# Patient Record
Sex: Female | Born: 1960 | Hispanic: Yes | Smoking: Former smoker
Health system: Northeastern US, Community
[De-identification: ages and names within clinical notes are randomized; demographics above are authoritative.]

## PROBLEM LIST (undated history)

## (undated) DIAGNOSIS — H524 Presbyopia: Secondary | ICD-10-CM

## (undated) DIAGNOSIS — A599 Trichomoniasis, unspecified: Secondary | ICD-10-CM

## (undated) DIAGNOSIS — A539 Syphilis, unspecified: Secondary | ICD-10-CM

## (undated) DIAGNOSIS — F329 Major depressive disorder, single episode, unspecified: Secondary | ICD-10-CM

## (undated) DIAGNOSIS — R946 Abnormal results of thyroid function studies: Secondary | ICD-10-CM

## (undated) DIAGNOSIS — N390 Urinary tract infection, site not specified: Secondary | ICD-10-CM

## (undated) DIAGNOSIS — H43819 Vitreous degeneration, unspecified eye: Principal | ICD-10-CM

## (undated) HISTORY — DX: Urinary tract infection, site not specified: N39.0

## (undated) HISTORY — PX: TRURL RF FEMALE BLADDER NECK STRS URIN INCONT: PRO0659

## (undated) HISTORY — DX: Abnormal results of thyroid function studies: R94.6

## (undated) HISTORY — PX: BREAST BIOPSY & NEEDLE LOC WIR: 2500020

## (undated) HISTORY — DX: Presbyopia: H52.4

## (undated) HISTORY — PX: OB ANTEPARTUM CARE CESAREAN DLVR & POSTPARTUM: REP299

## (undated) HISTORY — DX: Major depressive disorder, single episode, unspecified: F32.9

## (undated) HISTORY — DX: Vitreous degeneration, unspecified eye: H43.819

## (undated) HISTORY — DX: Syphilis, unspecified: A53.9

## (undated) HISTORY — DX: Trichomoniasis, unspecified: A59.9

## (undated) HISTORY — PX: BREAST BIOPSY: SHX20

## (undated) HISTORY — PX: EXCISION EXCESSIVE SKIN & SUBQ TISSUE ABDOMEN: PRO029

---

## 1996-07-13 ENCOUNTER — Ambulatory Visit: Payer: Self-pay

## 1996-07-13 DIAGNOSIS — N390 Urinary tract infection, site not specified: Secondary | ICD-10-CM

## 1996-07-26 ENCOUNTER — Ambulatory Visit: Payer: Self-pay

## 1996-07-26 DIAGNOSIS — B3731 Acute candidiasis of vulva and vagina: Secondary | ICD-10-CM

## 1996-07-26 DIAGNOSIS — B373 Candidiasis of vulva and vagina: Secondary | ICD-10-CM

## 1996-07-26 DIAGNOSIS — A5901 Trichomonal vulvovaginitis: Secondary | ICD-10-CM

## 1996-10-04 HISTORY — PX: LIG/TRNSXJ FLP TUBE ABDL/VAG APPR UNI/BI: REP202

## 1997-01-01 ENCOUNTER — Ambulatory Visit: Payer: Self-pay | Admitting: Nurse Practitioner

## 1997-01-01 DIAGNOSIS — Z3400 Encounter for supervision of normal first pregnancy, unspecified trimester: Secondary | ICD-10-CM

## 1997-01-15 ENCOUNTER — Ambulatory Visit: Payer: Self-pay | Admitting: Ambulatory Care

## 1997-01-15 DIAGNOSIS — R369 Urethral discharge, unspecified: Secondary | ICD-10-CM

## 1997-02-01 LAB — SKIN TEST TUBERCULOSIS INTRADERMAL: PPD: POSITIVE

## 1997-05-23 ENCOUNTER — Emergency Department (HOSPITAL_BASED_OUTPATIENT_CLINIC_OR_DEPARTMENT_OTHER): Payer: Self-pay | Admitting: Emergency Medicine

## 1997-05-23 DIAGNOSIS — O4290 Premature rupture of membranes, unspecified as to length of time between rupture and onset of labor, unspecified weeks of gestation: Secondary | ICD-10-CM

## 1999-01-24 ENCOUNTER — Emergency Department (HOSPITAL_BASED_OUTPATIENT_CLINIC_OR_DEPARTMENT_OTHER): Payer: Self-pay | Admitting: Emergency Medicine

## 1999-01-24 DIAGNOSIS — R55 Syncope and collapse: Secondary | ICD-10-CM

## 1999-01-24 DIAGNOSIS — N390 Urinary tract infection, site not specified: Secondary | ICD-10-CM

## 1999-01-29 ENCOUNTER — Ambulatory Visit: Payer: Self-pay | Admitting: Obstetrics & Gynecology

## 1999-02-05 ENCOUNTER — Ambulatory Visit: Payer: Self-pay | Admitting: Obstetrics & Gynecology

## 1999-02-16 ENCOUNTER — Ambulatory Visit (HOSPITAL_BASED_OUTPATIENT_CLINIC_OR_DEPARTMENT_OTHER): Payer: Self-pay | Admitting: Neurology

## 1999-02-26 ENCOUNTER — Other Ambulatory Visit (HOSPITAL_BASED_OUTPATIENT_CLINIC_OR_DEPARTMENT_OTHER): Payer: Self-pay | Admitting: Neurology

## 1999-03-26 ENCOUNTER — Ambulatory Visit (HOSPITAL_BASED_OUTPATIENT_CLINIC_OR_DEPARTMENT_OTHER): Payer: Self-pay | Admitting: Neurology

## 1999-04-13 ENCOUNTER — Ambulatory Visit: Payer: Self-pay | Admitting: Obstetrics & Gynecology

## 1999-04-20 ENCOUNTER — Ambulatory Visit: Payer: Self-pay | Admitting: Obstetrics & Gynecology

## 1999-05-04 ENCOUNTER — Ambulatory Visit: Payer: Self-pay | Admitting: Obstetrics & Gynecology

## 1999-05-04 ENCOUNTER — Ambulatory Visit (HOSPITAL_BASED_OUTPATIENT_CLINIC_OR_DEPARTMENT_OTHER): Payer: Self-pay | Admitting: Neurology

## 1999-05-07 ENCOUNTER — Other Ambulatory Visit: Payer: Self-pay | Admitting: Neurology

## 1999-05-21 ENCOUNTER — Ambulatory Visit (HOSPITAL_BASED_OUTPATIENT_CLINIC_OR_DEPARTMENT_OTHER): Payer: Self-pay | Admitting: Neurology

## 1999-05-21 ENCOUNTER — Ambulatory Visit: Payer: Self-pay | Admitting: Obstetrics & Gynecology

## 1999-06-02 ENCOUNTER — Ambulatory Visit (HOSPITAL_BASED_OUTPATIENT_CLINIC_OR_DEPARTMENT_OTHER): Payer: Self-pay | Admitting: Neurology

## 1999-06-05 ENCOUNTER — Other Ambulatory Visit: Payer: Self-pay | Admitting: Neurology

## 1999-06-23 ENCOUNTER — Ambulatory Visit (HOSPITAL_BASED_OUTPATIENT_CLINIC_OR_DEPARTMENT_OTHER): Payer: Self-pay | Admitting: Neurology

## 1999-06-23 LAB — CYTOLOGY SPECIMEN

## 1999-06-29 ENCOUNTER — Ambulatory Visit: Payer: Self-pay | Admitting: Obstetrics & Gynecology

## 1999-06-30 ENCOUNTER — Ambulatory Visit: Payer: Self-pay | Admitting: Internal Medicine

## 1999-06-30 LAB — CHG CYTP SLIDES CERV/VAG MNL SCRN PHYSICIAN SUPV

## 1999-07-07 ENCOUNTER — Ambulatory Visit (HOSPITAL_BASED_OUTPATIENT_CLINIC_OR_DEPARTMENT_OTHER): Payer: Self-pay | Admitting: Neurology

## 1999-07-14 ENCOUNTER — Ambulatory Visit: Payer: Self-pay | Admitting: Internal Medicine

## 1999-10-02 ENCOUNTER — Ambulatory Visit (HOSPITAL_BASED_OUTPATIENT_CLINIC_OR_DEPARTMENT_OTHER): Payer: Self-pay

## 1999-10-26 ENCOUNTER — Ambulatory Visit (HOSPITAL_BASED_OUTPATIENT_CLINIC_OR_DEPARTMENT_OTHER): Payer: Self-pay

## 1999-11-16 ENCOUNTER — Ambulatory Visit: Payer: Self-pay | Admitting: Obstetrics & Gynecology

## 1999-11-16 LAB — URINE CULTURE/COLONY COUNT: URINE CULTURE/COLONY COUNT: NO GROWTH

## 1999-11-16 LAB — RPR: RPR: REACTIVE — ABNORMAL HIGH

## 1999-11-16 LAB — HERPES CULTURE: HERPES CULTURE: NEGATIVE

## 1999-12-02 ENCOUNTER — Ambulatory Visit: Payer: Self-pay | Admitting: Obstetrics & Gynecology

## 1999-12-02 LAB — IADNA NEISSERIA GONORRHOEAE DIRECT PROBE TQ: GENPROBE GC: NEGATIVE

## 1999-12-02 LAB — URINALYSIS
BILIRUBIN, URINE: NEGATIVE
GLUCOSE, URINE: NEGATIVE MG/DL
KETONE, URINE: NEGATIVE MG/DL
LEUKOCYTE ESTERASE: NEGATIVE
NITRITE, URINE: NEGATIVE
OCCULT BLOOD, URINE: NEGATIVE
PH URINE: 5 (ref 5.0–8.0)
PROTEIN, URINE: NEGATIVE MG/DL
SPECIFIC GRAVITY URINE: 1.02 (ref 1.003–1.030)

## 1999-12-02 LAB — IADNA CHLAMYDIA TRACHOMATIS DIRECT PROBE TQ: GENPROBE CHLAMYDIA: NEGATIVE

## 1999-12-02 LAB — URINE CULTURE/COLONY COUNT: URINE CULTURE/COLONY COUNT: NO GROWTH

## 1999-12-03 ENCOUNTER — Ambulatory Visit (HOSPITAL_BASED_OUTPATIENT_CLINIC_OR_DEPARTMENT_OTHER): Payer: Self-pay

## 1999-12-16 ENCOUNTER — Ambulatory Visit: Payer: Self-pay | Admitting: Obstetrics & Gynecology

## 1999-12-24 ENCOUNTER — Ambulatory Visit (HOSPITAL_BASED_OUTPATIENT_CLINIC_OR_DEPARTMENT_OTHER): Payer: Self-pay

## 2000-01-14 ENCOUNTER — Ambulatory Visit (HOSPITAL_BASED_OUTPATIENT_CLINIC_OR_DEPARTMENT_OTHER): Payer: Self-pay

## 2000-02-23 ENCOUNTER — Ambulatory Visit: Payer: Self-pay | Admitting: Obstetrics & Gynecology

## 2000-02-23 LAB — IADNA CHLAMYDIA TRACHOMATIS DIRECT PROBE TQ: GENPROBE CHLAMYDIA: NEGATIVE

## 2000-02-23 LAB — IADNA NEISSERIA GONORRHOEAE DIRECT PROBE TQ: GENPROBE GC: NEGATIVE

## 2000-06-04 ENCOUNTER — Emergency Department (HOSPITAL_BASED_OUTPATIENT_CLINIC_OR_DEPARTMENT_OTHER): Payer: Self-pay | Admitting: Emergency Medical Services

## 2000-06-04 DIAGNOSIS — J329 Chronic sinusitis, unspecified: Secondary | ICD-10-CM

## 2000-07-18 ENCOUNTER — Ambulatory Visit: Payer: Self-pay | Admitting: OB/Gyn

## 2000-07-18 ENCOUNTER — Ambulatory Visit: Payer: Self-pay | Admitting: Internal Medicine

## 2000-07-18 LAB — URINALYSIS
BILIRUBIN, URINE: NEGATIVE
GLUCOSE, URINE: NEGATIVE MG/DL
KETONE, URINE: NEGATIVE MG/DL
LEUKOCYTE ESTERASE: NEGATIVE
NITRITE, URINE: NEGATIVE
OCCULT BLOOD, URINE: NEGATIVE
PH URINE: 5.5 (ref 5.0–8.0)
PROTEIN, URINE: NEGATIVE MG/DL
SPECIFIC GRAVITY URINE: 1.02 (ref 1.003–1.035)

## 2000-07-18 LAB — IADNA NEISSERIA GONORRHOEAE DIRECT PROBE TQ: GENPROBE GC: NEGATIVE

## 2000-07-18 LAB — IADNA CHLAMYDIA TRACHOMATIS DIRECT PROBE TQ: GENPROBE CHLAMYDIA: NEGATIVE

## 2000-07-18 LAB — URINE CULTURE/COLONY COUNT

## 2000-10-02 ENCOUNTER — Emergency Department (HOSPITAL_BASED_OUTPATIENT_CLINIC_OR_DEPARTMENT_OTHER): Payer: Self-pay | Admitting: Emergency Medicine

## 2000-10-02 DIAGNOSIS — R42 Dizziness and giddiness: Secondary | ICD-10-CM

## 2000-10-02 LAB — BLOOD COUNT COMPLETE AUTO&AUTO DIFRNTL WBC
BASOPHIL %: 0.6 % (ref 0–2)
EOSINOPHIL %: 0.5 % (ref 0–7)
HEMATOCRIT: 39.9 % (ref 37.0–47.0)
HEMOGLOBIN: 13.2 g/dL (ref 12.0–16.0)
LYMPHOCYTE %: 13.7 % (ref 12–39)
MEAN CORP HGB CONC: 33.1 g/dL (ref 32.0–36.0)
MEAN CORPUSCULAR HGB: 31.3 pg — ABNORMAL HIGH (ref 27.0–31.0)
MEAN CORPUSCULAR VOL: 94.5 fL (ref 81.0–99.0)
MEAN PLATELET VOLUME: 7.8 fL — ABNORMAL LOW (ref 8.0–12.0)
MONOCYTE %: 5.3 % (ref 1–12)
NEUTROPHIL %: 79.9 % — ABNORMAL HIGH (ref 46–79)
PLATELET COUNT: 250 10*3/uL (ref 150–400)
RBC DISTRIBUTION WIDTH: 11.6 % (ref 11.5–14.3)
RED BLOOD CELL COUNT: 4.23 MIL/uL (ref 4.20–5.40)
WHITE BLOOD CELL COUNT: 8.8 10*3/uL (ref 4.8–10.8)

## 2000-10-02 LAB — COMPREHENSIVE METABOLIC PANEL
ALANINE AMINOTRANSFERASE: 10 IU/L (ref 3–36)
ALBUMIN: 4.3 g/dl (ref 3.5–5.0)
ALKALINE PHOSPHATASE: 69 IU/L (ref 50–136)
ASPARTATE AMINOTRANSFERASE: 27 U/L (ref 7–29)
BILIRUBIN TOTAL: 0.8 mg/dl (ref 0.15–1.0)
BUN (UREA NITROGEN): 9 mg/dl — ABNORMAL LOW (ref 10–20)
CALCIUM: 9.3 mg/dl (ref 8.5–10.5)
CARBON DIOXIDE: 26 mEQ/L (ref 22–32)
CHLORIDE: 110 mEQ/L (ref 98–110)
CREATININE: 0.6 mg/dl — ABNORMAL LOW (ref 0.8–1.2)
Glucose Random: 96 mg/dl (ref 65–160)
POTASSIUM: 5.1 mEQ/L — ABNORMAL HIGH (ref 3.5–5.0)
SODIUM: 142 mEQ/L (ref 135–145)
TOTAL PROTEIN: 7.6 g/dl (ref 6.2–8.5)

## 2000-11-02 ENCOUNTER — Ambulatory Visit: Payer: Self-pay | Admitting: Family Medicine

## 2000-11-02 LAB — IADNA NEISSERIA GONORRHOEAE DIRECT PROBE TQ: GENPROBE GC: NEGATIVE

## 2000-11-02 LAB — CYTOPATH, C/V, THIN LAYER

## 2000-11-02 LAB — IADNA CHLAMYDIA TRACHOMATIS DIRECT PROBE TQ: GENPROBE CHLAMYDIA: NEGATIVE

## 2000-11-11 ENCOUNTER — Other Ambulatory Visit: Payer: Self-pay | Admitting: Family Medicine

## 2000-11-11 LAB — FAT AND FIBER, STOOL

## 2000-11-11 LAB — GIARDIA ANTIGEN: GIARDIA ANTIGEN: NEGATIVE

## 2000-11-11 LAB — GASTROINTESTINAL CULTURE

## 2000-11-11 LAB — FECAL LEUKOCYTE SCREEN: PROCEDURE PROMPT: NONE SEEN

## 2001-01-04 ENCOUNTER — Ambulatory Visit: Payer: Self-pay | Admitting: Family Medicine

## 2001-01-15 ENCOUNTER — Emergency Department (HOSPITAL_BASED_OUTPATIENT_CLINIC_OR_DEPARTMENT_OTHER): Payer: Self-pay | Admitting: Emergency Medicine

## 2001-01-15 DIAGNOSIS — N739 Female pelvic inflammatory disease, unspecified: Secondary | ICD-10-CM

## 2001-01-15 LAB — IADNA CHLAMYDIA TRACHOMATIS DIRECT PROBE TQ: GENPROBE CHLAMYDIA: NEGATIVE

## 2001-01-15 LAB — URINE CULTURE/COLONY COUNT

## 2001-01-15 LAB — IADNA NEISSERIA GONORRHOEAE DIRECT PROBE TQ: GENPROBE GC: NEGATIVE

## 2001-03-10 ENCOUNTER — Ambulatory Visit: Payer: Self-pay | Admitting: Obstetrics & Gynecology

## 2001-03-10 LAB — URINE CULTURE/COLONY COUNT

## 2001-05-01 ENCOUNTER — Ambulatory Visit: Payer: Self-pay | Admitting: Internal Medicine

## 2001-06-26 ENCOUNTER — Ambulatory Visit: Payer: Self-pay | Admitting: Internal Medicine

## 2001-08-03 ENCOUNTER — Other Ambulatory Visit: Payer: Self-pay

## 2001-08-03 ENCOUNTER — Ambulatory Visit: Payer: Self-pay | Admitting: OB/Gyn

## 2001-08-03 DIAGNOSIS — Z717 Human immunodeficiency virus [HIV] counseling: Secondary | ICD-10-CM

## 2001-08-03 LAB — IADNA CHLAMYDIA TRACHOMATIS DIRECT PROBE TQ: GENPROBE CHLAMYDIA: NEGATIVE

## 2001-08-03 LAB — IADNA NEISSERIA GONORRHOEAE DIRECT PROBE TQ: GENPROBE GC: NEGATIVE

## 2001-08-03 LAB — URINE CULTURE/COLONY COUNT: URINE CULTURE/COLONY COUNT: NO GROWTH

## 2001-08-24 ENCOUNTER — Ambulatory Visit: Payer: Self-pay | Admitting: OB/Gyn

## 2001-08-24 LAB — IADNA CHLAMYDIA TRACHOMATIS DIRECT PROBE TQ: GENPROBE CHLAMYDIA: NEGATIVE

## 2001-08-24 LAB — IADNA NEISSERIA GONORRHOEAE DIRECT PROBE TQ: GENPROBE GC: NEGATIVE

## 2001-08-24 LAB — CYTOPATH, C/V, THIN LAYER

## 2001-09-12 ENCOUNTER — Ambulatory Visit: Payer: Self-pay | Admitting: Internal Medicine

## 2001-09-14 ENCOUNTER — Emergency Department (HOSPITAL_BASED_OUTPATIENT_CLINIC_OR_DEPARTMENT_OTHER): Payer: Self-pay | Admitting: Emergency Medicine

## 2001-09-14 DIAGNOSIS — R42 Dizziness and giddiness: Secondary | ICD-10-CM

## 2001-09-16 ENCOUNTER — Emergency Department (HOSPITAL_BASED_OUTPATIENT_CLINIC_OR_DEPARTMENT_OTHER): Payer: Self-pay | Admitting: Emergency Medicine

## 2001-09-16 DIAGNOSIS — R42 Dizziness and giddiness: Secondary | ICD-10-CM

## 2001-09-20 LAB — CT HEAD WO CONTRAST

## 2001-10-09 ENCOUNTER — Ambulatory Visit: Payer: Self-pay | Admitting: Internal Medicine

## 2002-01-04 ENCOUNTER — Other Ambulatory Visit: Payer: Self-pay | Admitting: Internal Medicine

## 2002-01-04 LAB — MA SCREENING MAMMO BILATERAL WITH CAD

## 2002-05-10 ENCOUNTER — Ambulatory Visit: Payer: Self-pay | Admitting: Nurse Practitioner

## 2002-05-31 ENCOUNTER — Encounter (HOSPITAL_BASED_OUTPATIENT_CLINIC_OR_DEPARTMENT_OTHER): Payer: Self-pay

## 2002-07-09 ENCOUNTER — Other Ambulatory Visit: Payer: Self-pay | Admitting: Nurse Practitioner

## 2002-07-11 LAB — MA DIAGNOSTIC MAMMO UNILATERAL LEFT WITH CAD

## 2002-10-04 HISTORY — PX: REDUCTION MAMMAPLASTY: REP20

## 2002-10-16 ENCOUNTER — Ambulatory Visit: Payer: Self-pay | Admitting: Nurse Practitioner

## 2002-10-25 ENCOUNTER — Encounter: Payer: Self-pay | Admitting: Internal Medicine

## 2002-11-10 LAB — URINALYSIS
BACTERIA: <10 PER HPF — ABNORMAL HIGH (ref 0–?)
BACTERIA: <10 PER HPF — ABNORMAL HIGH (ref 0–?)
BILIRUBIN, URINE: NEGATIVE
BILIRUBIN, URINE: NEGATIVE
BILIRUBIN, URINE: NEGATIVE
CASTS: NONE SEEN PER LPF
CASTS: NONE SEEN PER LPF
CASTS: NONE SEEN PER LPF
CRYSTALS: NONE SEEN
CRYSTALS: NONE SEEN
CRYSTALS: NONE SEEN
GLUCOSE, URINE: NEGATIVE MG/DL
GLUCOSE, URINE: NEGATIVE MG/DL
GLUCOSE, URINE: NEGATIVE MG/DL
KETONE, URINE: NEGATIVE MG/DL
KETONE, URINE: NEGATIVE MG/DL
KETONE, URINE: NEGATIVE MG/DL
LEUKOCYTE ESTERASE: NEGATIVE
NITRITE, URINE: NEGATIVE
NITRITE, URINE: NEGATIVE
NITRITE, URINE: NEGATIVE
PH URINE: 5.5 (ref 5.0–8.0)
PH URINE: 5.5 (ref 5.0–8.0)
PH URINE: 5.5 (ref 5.0–8.0)
PROTEIN, URINE: NEGATIVE MG/DL
PROTEIN, URINE: NEGATIVE MG/DL
PROTEIN, URINE: NEGATIVE MG/DL
SPECIFIC GRAVITY URINE: 1.02 (ref 1.003–1.035)
SPECIFIC GRAVITY URINE: 1.02 (ref 1.003–1.035)
SPECIFIC GRAVITY URINE: 1.02 (ref 1.003–1.035)

## 2002-11-10 LAB — BLOOD COUNT COMPLETE AUTO&AUTO DIFRNTL WBC
BASOPHIL %: 0.3 % (ref 0–2)
EOSINOPHIL %: 1.6 % (ref 0–7)
HEMATOCRIT: 38.5 % (ref 37.0–47.0)
HEMOGLOBIN: 13.5 g/dL (ref 12.0–16.0)
LYMPHOCYTE %: 25.2 % (ref 12–39)
MEAN CORP HGB CONC: 35 g/dL (ref 32.0–36.0)
MEAN CORPUSCULAR HGB: 33.7 pg — ABNORMAL HIGH (ref 27.0–31.0)
MEAN CORPUSCULAR VOL: 96.4 fL (ref 81.0–99.0)
MEAN PLATELET VOLUME: 7.9 fL (ref 6.4–10.8)
MONOCYTE %: 5.9 % (ref 1–12)
NEUTROPHIL %: 67 % (ref 46–79)
PLATELET COUNT: 247 10*3/uL (ref 150–400)
RBC DISTRIBUTION WIDTH: 11.1 % — ABNORMAL LOW (ref 11.5–14.3)
RED BLOOD CELL COUNT: 3.99 MIL/uL — ABNORMAL LOW (ref 4.20–5.40)
WHITE BLOOD CELL COUNT: 7.1 10*3/uL (ref 4.8–10.8)

## 2002-11-10 LAB — COMPREHENSIVE METABOLIC PANEL
ALANINE AMINOTRANSFERASE: 14 IU/L (ref 3–36)
ALBUMIN: 4.1 g/dl (ref 3.5–5.0)
ALKALINE PHOSPHATASE: 62 IU/L (ref 50–136)
ASPARTATE AMINOTRANSFERASE: 22 U/L (ref 7–29)
BILIRUBIN TOTAL: 1.1 mg/dl — ABNORMAL HIGH (ref 0.15–1.0)
BUN (UREA NITROGEN): 9 mg/dl — ABNORMAL LOW (ref 10–20)
CALCIUM: 10 mg/dl (ref 8.5–10.5)
CARBON DIOXIDE: 30 mEQ/L (ref 22–32)
CHLORIDE: 106 mEQ/L (ref 98–110)
CREATININE: 0.7 mg/dl — ABNORMAL LOW (ref 0.8–1.2)
Glucose Random: 94 mg/dl (ref 65–160)
POTASSIUM: 4.4 mEQ/L (ref 3.5–5.0)
SODIUM: 141 mEQ/L (ref 135–145)
TOTAL PROTEIN: 7.4 g/dl (ref 6.2–8.5)

## 2002-11-15 ENCOUNTER — Encounter: Payer: Self-pay | Admitting: Nurse Practitioner

## 2003-01-10 ENCOUNTER — Other Ambulatory Visit: Payer: Self-pay | Admitting: Nurse Practitioner

## 2003-01-10 LAB — LEFT BREAST ADDITIONAL VIEWS

## 2003-10-09 ENCOUNTER — Encounter: Payer: Self-pay | Admitting: Nurse Practitioner

## 2003-11-12 ENCOUNTER — Encounter: Payer: Self-pay | Admitting: Nurse Practitioner

## 2004-01-30 ENCOUNTER — Ambulatory Visit: Payer: Self-pay | Admitting: Nurse Practitioner

## 2004-01-30 LAB — URINALYSIS
BILIRUBIN, URINE: NEGATIVE
GLUCOSE, URINE: NEGATIVE MG/DL
KETONE, URINE: NEGATIVE MG/DL
LEUKOCYTE ESTERASE: NEGATIVE
NITRITE, URINE: NEGATIVE
OCCULT BLOOD, URINE: NEGATIVE
PH URINE: 6.5 (ref 5.0–8.0)
PROTEIN, URINE: NEGATIVE MG/DL
SPECIFIC GRAVITY URINE: 1.012 (ref 1.003–1.035)

## 2004-01-31 LAB — IADNA NEISSERIA GONORRHOEAE DIRECT PROBE TQ: GENPROBE GC: NEGATIVE

## 2004-01-31 LAB — IADNA CHLAMYDIA TRACHOMATIS DIRECT PROBE TQ: GENPROBE CHLAMYDIA: NEGATIVE

## 2004-01-31 LAB — URINE CULTURE/COLONY COUNT

## 2004-02-08 LAB — HEPATITIS B SURFACE ANTIBODY: HEPATITIS B SURFACE ANTIBODY: POSITIVE

## 2004-02-08 LAB — HEPATITIS A ANTIBODY TOTAL: HEPATITIS A ANTIBODY TOTAL: POSITIVE

## 2004-02-08 LAB — RPR: RPR: REACTIVE — AB

## 2004-02-08 LAB — HEPATITIS C ANTIBODY: HEPATITIS C ANTIBODY: NEGATIVE

## 2004-02-13 ENCOUNTER — Ambulatory Visit: Payer: Self-pay | Admitting: Nurse Practitioner

## 2004-03-05 ENCOUNTER — Ambulatory Visit: Payer: Self-pay | Admitting: Nurse Practitioner

## 2004-03-05 ENCOUNTER — Other Ambulatory Visit: Payer: Self-pay

## 2004-03-05 DIAGNOSIS — Z717 Human immunodeficiency virus [HIV] counseling: Secondary | ICD-10-CM

## 2004-03-06 LAB — IADNA NEISSERIA GONORRHOEAE DIRECT PROBE TQ: GENPROBE GC: NEGATIVE

## 2004-03-06 LAB — IADNA CHLAMYDIA TRACHOMATIS DIRECT PROBE TQ: GENPROBE CHLAMYDIA: NEGATIVE

## 2004-03-18 LAB — HEPATITIS A ANTIBODY TOTAL: HEPATITIS A ANTIBODY TOTAL: POSITIVE

## 2004-03-18 LAB — HEPATITIS B SURFACE ANTIBODY: HEPATITIS B SURFACE ANTIBODY: POSITIVE

## 2004-03-18 LAB — HEPATITIS B SURFACE ANTIGEN: HEPATITIS B SURFACE ANTIGEN: NONREACTIVE

## 2004-03-18 LAB — HIV 1 AND 2 PLUS O ANTIBODY: HIV 1 AND 2 PLUS O SCREEN: NONREACTIVE

## 2004-03-18 LAB — HEPATITIS A ANTIBODY IGM: HEPATITIS A ANTIBODY IGM: NEGATIVE

## 2004-03-18 LAB — RPR: RPR: REACTIVE — AB

## 2004-03-18 LAB — HEPATITIS C ANTIBODY: HEPATITIS C ANTIBODY: NEGATIVE

## 2004-04-01 ENCOUNTER — Other Ambulatory Visit: Payer: Self-pay

## 2004-06-16 ENCOUNTER — Encounter: Payer: Self-pay | Admitting: Nurse Practitioner

## 2004-06-25 ENCOUNTER — Emergency Department (HOSPITAL_BASED_OUTPATIENT_CLINIC_OR_DEPARTMENT_OTHER): Payer: Self-pay | Admitting: Emergency Medicine

## 2004-08-31 ENCOUNTER — Encounter (HOSPITAL_BASED_OUTPATIENT_CLINIC_OR_DEPARTMENT_OTHER): Payer: Self-pay | Admitting: Nurse Practitioner

## 2004-08-31 DIAGNOSIS — N390 Urinary tract infection, site not specified: Secondary | ICD-10-CM

## 2004-08-31 DIAGNOSIS — A599 Trichomoniasis, unspecified: Secondary | ICD-10-CM | POA: Insufficient documentation

## 2004-08-31 DIAGNOSIS — R946 Abnormal results of thyroid function studies: Secondary | ICD-10-CM | POA: Insufficient documentation

## 2004-08-31 DIAGNOSIS — F3289 Other specified depressive episodes: Secondary | ICD-10-CM

## 2004-08-31 DIAGNOSIS — A539 Syphilis, unspecified: Secondary | ICD-10-CM | POA: Insufficient documentation

## 2004-08-31 DIAGNOSIS — R7611 Nonspecific reaction to tuberculin skin test without active tuberculosis: Secondary | ICD-10-CM | POA: Insufficient documentation

## 2004-08-31 DIAGNOSIS — F3340 Major depressive disorder, recurrent, in remission, unspecified: Secondary | ICD-10-CM | POA: Insufficient documentation

## 2004-08-31 HISTORY — DX: Syphilis, unspecified: A53.9

## 2004-08-31 HISTORY — DX: Other specified depressive episodes: F32.89

## 2004-08-31 HISTORY — DX: Urinary tract infection, site not specified: N39.0

## 2004-08-31 HISTORY — DX: Trichomoniasis, unspecified: A59.9

## 2004-08-31 HISTORY — DX: Abnormal results of thyroid function studies: R94.6

## 2004-09-16 ENCOUNTER — Ambulatory Visit: Payer: Self-pay | Admitting: Nurse Practitioner

## 2004-09-16 LAB — URINALYSIS
BILIRUBIN, URINE: NEGATIVE
GLUCOSE, URINE: NEGATIVE MG/DL
KETONE, URINE: NEGATIVE MG/DL
LEUKOCYTE ESTERASE: NEGATIVE
NITRITE, URINE: NEGATIVE
OCCULT BLOOD, URINE: NEGATIVE
PH URINE: 5 (ref 5.0–8.0)
PROTEIN, URINE: NEGATIVE MG/DL
SPECIFIC GRAVITY URINE: 1.025 (ref 1.003–1.035)

## 2004-09-16 LAB — CYTOPATH, C/V, THIN LAYER

## 2004-09-17 LAB — IADNA CHLAMYDIA TRACHOMATIS DIRECT PROBE TQ: GENPROBE CHLAMYDIA: NEGATIVE

## 2004-09-17 LAB — IADNA NEISSERIA GONORRHOEAE DIRECT PROBE TQ: GENPROBE GC: NEGATIVE

## 2004-09-18 LAB — URINE CULTURE/COLONY COUNT

## 2004-10-06 ENCOUNTER — Encounter (HOSPITAL_BASED_OUTPATIENT_CLINIC_OR_DEPARTMENT_OTHER): Payer: Self-pay | Admitting: Nurse Practitioner

## 2004-10-07 ENCOUNTER — Other Ambulatory Visit (HOSPITAL_BASED_OUTPATIENT_CLINIC_OR_DEPARTMENT_OTHER): Payer: Self-pay | Admitting: Nurse Practitioner

## 2004-10-07 DIAGNOSIS — N83209 Unspecified ovarian cyst, unspecified side: Secondary | ICD-10-CM

## 2004-10-07 LAB — US TRANSVAGINAL NON-OB

## 2004-10-07 LAB — US PELVIC NON-PREGNANT

## 2004-10-13 ENCOUNTER — Encounter: Payer: Self-pay | Admitting: Nurse Practitioner

## 2004-12-01 ENCOUNTER — Telehealth (HOSPITAL_BASED_OUTPATIENT_CLINIC_OR_DEPARTMENT_OTHER): Payer: Self-pay | Admitting: Ambulatory Care

## 2004-12-01 NOTE — Telephone Encounter (Signed)
Occasional ringing of the ears . Becomes dizzy . Ringing will last hour or so . Would like to be seen. Appoint made

## 2004-12-01 NOTE — Telephone Encounter (Signed)
Staff Message copied by Audrie Gallus on 12/01/2004 at 12:18 PM  ------   Message from: Angus Palms   Created: 12/01/2004 at 11:54 AM   Regarding: SICK   Contact: [phone]    Josie Burleigh [id number], 44 year old, female, [phone] (home) [phone] (work)    Cell phone:   Other phone:    Available times:    Patient's language of care: [place]    Patient needs a [place] interpreter.    Person calling on behalf of patient: patient (self)    Calls today with a sick call.PATIENT BEEN HAVING RINGING IN HER LEFT EAR FOR ONE MONTH 1/2 IT'S MAKING HER DIZZY.

## 2004-12-04 ENCOUNTER — Ambulatory Visit (HOSPITAL_BASED_OUTPATIENT_CLINIC_OR_DEPARTMENT_OTHER): Payer: No Typology Code available for payment source | Admitting: Internal Medicine

## 2004-12-04 ENCOUNTER — Encounter (HOSPITAL_BASED_OUTPATIENT_CLINIC_OR_DEPARTMENT_OTHER): Payer: Self-pay | Admitting: Internal Medicine

## 2004-12-04 VITALS — BP 120/80 | Temp 98.3°F | Ht 61.81 in | Wt 133.0 lb

## 2004-12-04 DIAGNOSIS — H8109 Meniere's disease, unspecified ear: Secondary | ICD-10-CM | POA: Insufficient documentation

## 2004-12-04 DIAGNOSIS — H539 Unspecified visual disturbance: Secondary | ICD-10-CM

## 2004-12-04 NOTE — Progress Notes (Signed)
Loretta Martin is a 44 year old female who reports ringing in left ear for about 1 month. Comes and goes. Sometimes very loud usually at night. Sometimes gets headaches because of this. The day it started also had dizziness. Couldn't turn head left or right. A little bit of nausea. Sx are only in left ear. Cleans ears with cotton swabs every day after shower. Has had recurran episodes of vertigo and dizziness in the past but they are infrequent. Has not seen me in 2 years, but has ben seeing gynecologist frequently.    Also notes worsening vision and straining eyes to see.    OBJECTIVE:  External Ears normal. Throat and pharynx normal. Neck supple. No adenopathy or masses in the neck or supraclavicular regions. Sinuses non tender.  EOMI, PEERLA, no scotoma  Tuning fork lateralizes to l ear.  Ringing increases with obstruction of canal.    386.00 Tinnitus- given reccurant vertigo ? Of MENIERE'S DISEASE  Note: educated re ear hygine and use of white noise  Plan: REFERRAL TO ENT (INT)       368.9 VISUAL DISTURBANCE NOS  Note: likely presbyopia  Plan: REFERRAL TO OPHTHALMOLOGY (INT)

## 2004-12-11 ENCOUNTER — Ambulatory Visit (HOSPITAL_BASED_OUTPATIENT_CLINIC_OR_DEPARTMENT_OTHER): Payer: No Typology Code available for payment source | Admitting: Internal Medicine

## 2004-12-15 ENCOUNTER — Encounter (HOSPITAL_BASED_OUTPATIENT_CLINIC_OR_DEPARTMENT_OTHER): Payer: Self-pay

## 2004-12-23 ENCOUNTER — Encounter (HOSPITAL_BASED_OUTPATIENT_CLINIC_OR_DEPARTMENT_OTHER): Payer: Self-pay

## 2004-12-24 ENCOUNTER — Telehealth (HOSPITAL_BASED_OUTPATIENT_CLINIC_OR_DEPARTMENT_OTHER): Payer: Self-pay | Admitting: Ambulatory Care

## 2004-12-24 NOTE — Telephone Encounter (Signed)
Rec return call from pt,staes X3 days of mild lower abdominal cramping, LMP-3/2/6.Per pt s/p TL.Also staes internittent bloating X3 days, denies N/V, diarrhea or constipation, and fever. Last BM yesterday.[facility] pt, will forward to Johnnette Barrios triage NP.

## 2004-12-24 NOTE — Telephone Encounter (Signed)
Spoke with patient. Having cramps like mild menstrual pain, and some bloating. LMP early this month. No bowel problem. Advised use motrin, heating pad/ hot water bottle and call if pain is worsening or not resolved. Discussed possibly related to menstrual cycle.

## 2004-12-24 NOTE — Telephone Encounter (Signed)
Staff Message copied by Claudell Kyle on 12/24/2004 at 9:33 AM  ------   Message from: Gershon Crane   Created: 12/24/2004 at 9:06 AM    Loretta Martin [id number], 44 year old, female, [phone] (home) [phone] (work)    CALL BACK NUMBER: [phone]  Cell phone:   Other phone:    Available times:    Patient's language of care: [place]    Patient does not need an interpreter.    Person calling on behalf of patient: patient (self)    Calls today with a sick call.HER BELLY SWELLEN.

## 2005-01-06 ENCOUNTER — Other Ambulatory Visit (HOSPITAL_BASED_OUTPATIENT_CLINIC_OR_DEPARTMENT_OTHER): Payer: No Typology Code available for payment source | Admitting: Otolaryngology

## 2005-02-03 ENCOUNTER — Ambulatory Visit (HOSPITAL_BASED_OUTPATIENT_CLINIC_OR_DEPARTMENT_OTHER): Payer: No Typology Code available for payment source | Admitting: Internal Medicine

## 2005-02-12 ENCOUNTER — Ambulatory Visit (HOSPITAL_BASED_OUTPATIENT_CLINIC_OR_DEPARTMENT_OTHER): Payer: No Typology Code available for payment source | Admitting: Internal Medicine

## 2005-02-26 ENCOUNTER — Telehealth (HOSPITAL_BASED_OUTPATIENT_CLINIC_OR_DEPARTMENT_OTHER): Payer: Self-pay | Admitting: Ambulatory Care

## 2005-02-26 NOTE — Telephone Encounter (Signed)
Left message stating no acute tx necessary from report. Can go to ED if necessary.

## 2005-02-26 NOTE — Telephone Encounter (Signed)
Spoke with patient. Noticed what appears to be insect bite last night. Round quarter sized lump on arm, feels warm to touch. No dng, fever, chills,pain. No SOB, facial swelling or dyspnea.    Advised will speak with pcp and call her back.

## 2005-02-26 NOTE — Telephone Encounter (Signed)
Staff Message copied by Windell Moulding on 02/26/2005 at 1:58 PM  ------   Message from: Beola Cord   Created: 02/26/2005 at 1:51 PM   Regarding: pt had insect bite is red and itching   Contact: [phone]    Bambie Pizzolato [id number], 44 year old, female, [phone] (home) [phone] (work)    CALL BACK NUMBER: [phone]  Cell phone:   Other phone:    Available times:    Patient's language of care: [place]    Patient does not need an interpreter.    Person calling on behalf of patient: patient (self)    Calls today with a sick call.pt had insect bite is red and itching

## 2005-03-12 ENCOUNTER — Ambulatory Visit (HOSPITAL_BASED_OUTPATIENT_CLINIC_OR_DEPARTMENT_OTHER): Payer: No Typology Code available for payment source | Admitting: Internal Medicine

## 2005-03-12 VITALS — BP 130/70 | Temp 99.4°F | Ht 61.5 in | Wt 136.0 lb

## 2005-03-12 DIAGNOSIS — I8390 Asymptomatic varicose veins of unspecified lower extremity: Secondary | ICD-10-CM

## 2005-03-12 DIAGNOSIS — N920 Excessive and frequent menstruation with regular cycle: Secondary | ICD-10-CM

## 2005-03-12 DIAGNOSIS — M545 Low back pain, unspecified: Secondary | ICD-10-CM

## 2005-03-12 DIAGNOSIS — L03039 Cellulitis of unspecified toe: Secondary | ICD-10-CM

## 2005-03-12 NOTE — Progress Notes (Signed)
Loretta Martin is a 44 year old female who arrives 10 minutes late to clinic and then wants a "full Exam" and also notes multiple ongoing issues. Chronic back pain and lower quedrant pain. Notes very heavy menses increasing over the last year, and then states she is taking [place] OCP "Infertil" which she buys on her own. Patient never followed up with ENT, nor did she make f/u with me because. "I don't like doctors and I dont like pills". States she used to have deprssion, but has not been taking meds for this. "I don't think this helps."    Patient then immediately asks for surgery to renmove varicose veins and medications to treat onychomycosis.    Pain today is at back, never takes any meds for this. Worse with heavy lifting. Not associated with any triggers. No change in bowel habits.    OBJECTIVE:  NAd  Cervical, thoracic and lumbar spine exam is normal without tenderness, masses or kyphoscoliosis. Full range of motion without pain is noted.  Multiple large tortuous veins at right popliteus.    626.2 EXCESSIVE MENSTRUATION (primary encounter diagnosis)  Note: while on OCP's and after relatively normal ultrasound. Will send to gyne for ? 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

## 2005-03-17 ENCOUNTER — Encounter (HOSPITAL_BASED_OUTPATIENT_CLINIC_OR_DEPARTMENT_OTHER): Payer: Self-pay | Admitting: Nurse Practitioner

## 2005-03-23 ENCOUNTER — Other Ambulatory Visit (HOSPITAL_BASED_OUTPATIENT_CLINIC_OR_DEPARTMENT_OTHER): Payer: No Typology Code available for payment source | Admitting: Surgery

## 2005-03-23 DIAGNOSIS — M79609 Pain in unspecified limb: Secondary | ICD-10-CM

## 2005-04-01 LAB — SURGICAL SPECIALTIES LETTER

## 2005-04-02 ENCOUNTER — Encounter (HOSPITAL_BASED_OUTPATIENT_CLINIC_OR_DEPARTMENT_OTHER): Payer: Self-pay | Admitting: Obstetrics & Gynecology

## 2005-04-14 ENCOUNTER — Encounter (HOSPITAL_BASED_OUTPATIENT_CLINIC_OR_DEPARTMENT_OTHER): Payer: No Typology Code available for payment source | Admitting: Internal Medicine

## 2005-08-10 ENCOUNTER — Encounter (HOSPITAL_BASED_OUTPATIENT_CLINIC_OR_DEPARTMENT_OTHER): Payer: Self-pay | Admitting: Internal Medicine

## 2005-08-10 ENCOUNTER — Ambulatory Visit (HOSPITAL_BASED_OUTPATIENT_CLINIC_OR_DEPARTMENT_OTHER): Payer: Self-pay | Admitting: Internal Medicine

## 2005-08-10 VITALS — BP 100/70 | Temp 98.5°F | Ht 64.0 in | Wt 140.0 lb

## 2005-08-10 DIAGNOSIS — Z833 Family history of diabetes mellitus: Secondary | ICD-10-CM

## 2005-08-10 DIAGNOSIS — Z0000 Encounter for general adult medical examination without abnormal findings: Secondary | ICD-10-CM

## 2005-08-10 DIAGNOSIS — Z23 Encounter for immunization: Secondary | ICD-10-CM

## 2005-08-10 LAB — CHG LIPID PANEL
Cholesterol: 172 mg/dl (ref 0–200)
HIGH DENSITY LIPOPROTEIN: 46 mg/dl (ref 35–85)
LOW DENSITY LIPOPROTEIN DIRECT: 126 mg/dl — ABNORMAL HIGH (ref 0–100)
RISK FACTOR: 3.7 (ref ?–4.4)
TRIGLYCERIDES: 79 mg/dl (ref 0–150)

## 2005-08-10 LAB — GLUCOSE RANDOM: Glucose Random: 90 mg/dl (ref 74–160)

## 2005-08-10 LAB — TSH (THYROID STIMULATING HORMONE): TSH (THYROID STIM HORMONE): 3.36 u[IU]/mL (ref 0.34–5.60)

## 2005-08-10 NOTE — Patient Instructions (Signed)
CHOLESTEROL AND YOUR HEALTH     High cholesterol can cause hardening of the arteries, high blood   pressure, chest pain and heart attack. If you have high   cholesterol, reduce your cholesterol intake.    Animal fats - egg yolks, fatty or organ meats, shrimp, cheese and   other whole-milk dairy products - contain cholesterol.    Saturated fats increase cholesterol levels - hydrogenated fats,   coconut and palm-kernel oils, many snack foods.    Monosaturated fats affect cholesterol to a lesser extent - olive   and peanut oils, nuts, peanut butter, avocados, most commercial   salad dressings, many cereals.    Polyunsaturated fats actually can lower cholesterol levels - corn,   cottonseed, safflower, sesame, soybean, and sunflower oils, walnuts   and walnut oil.    Read labels and keep daily intake of cholesterol below 300 mg (one   large egg yolk contains 215 mg). Calories from fat shouldn’t exceed   30% of total caloric intake.    Add exercise to your low cholesterol diet.    If your doctor wants you to take a cholesterol-lowering drug, try   it even if you feel fine - don’t give cholesterol a chance to   damage your arteries and your heart.   Exercise And Diabetes  Copyright © 1997 American Diabetes Association       Exercise, along with good nutrition and medications (insulin or oral diabetes pills), is important for good diabetes control. Good diabetes control means keeping your blood sugar level as close to normal (90-126 milligrams per deciliter [mg/dl]) as possible. Exercise is especially good for people with diabetes.       Why Is Exercise Important?   Exercise usually lowers blood sugar. That helps your body use its food supply better. Also, exercise may help insulin work better. If you are overweight, exercise, plus careful attention to diet, can help take off extra pounds.     Exercise is important in many other ways. It improves the flow of blood through the small blood vessels and increases your heart's  pumping power. The right exercise program may make you look and feel better.       What Kinds Of Exercise Are Best?   Your health care provider can help you decide what kinds of exercise, and how much exercise, are best suited to your needs. If your blood sugar control is poor, do not exercise. Get medical advice first.     If you have retinopathy (diabetic eye disease) or blood vessel problems, you need your doctor's advice about which activities are safe.     Exercise has value only if it's done regularly. People with diabetes should exercise at least several days a week.     What do people with type 1 diabetes need to know about exercising?     Before starting any exercise program, check with your doctor. Your activity must be planned to fit in with your meal plan and with the action times and amounts of your insulin.     If you're exercising more than 1 hour after eating, it's a good idea to eat before your start. As a rule, a high-carbohydrate snack is good before or during mild to moderate exercise (walking, biking, or golf). Such a snack could be 6 ounces of fruit juice or one half of a plain bagel.     If you plan on doing heavier exercise (aerobics, running, squash, or handball), you may need to   eat a little more, such as half of a meat sandwich and a cup of low-fat milk.     It's always a good idea to check your blood sugar level before you start exercising. If you are low (under 70 mg/dl), you will need a snack to avoid having low blood sugar while you exercise. This would cause an insulin reaction.     A reaction might make you feel faint, sweaty, dizzy, or confused. An insulin reaction can occur while you exercise or several hours, even up to 12 hours, later.     If you feel an insulin reaction coming on while exercising, STOP. IMMEDIATELY have one-half cup of orange juice or nondiet soft drink or 3 glucose tablets.     You need to treat an insulin reaction as soon as you feel it. Don't wait, otherwise  it could become worse. Whenever you exercise, you should bring along some raisins or Lifesavers' candy to eat just in case. They will raise your blood sugar level.     If you play a team sport such as baseball or basketball, you should let someone know you have diabetes and teach them how to help you, if needed. If you like running or cycling, do them with a friend or family member. If you can't find anyone to go with you, let someone know where you are going and when you will be back.     With regular exercise, you will need to test your blood sugar more often.     What do people with type 2 diabetes need to know?     Almost 9 of 10 people with type 2 diabetes are overweight. Most often, they are also past 40. In many people, type 2 diabetes can be controlled through diet and exercise. For these reasons, exercise is a very important part of the diabetes control plan for those with type 2 diabetes.   Exercise burns calories that your body would otherwise store as extra weight. And because exercise also helps lower blood sugar levels, exercise can help your diabetes control.   If you use insulin or oral diabetes pills to control your type 2 diabetes, you should know your blood sugar level before you start exercising. If you are low, you may need a snack.   How Do You Begin Exercising?   The first step is to check with your doctor. Together, you can decide how much and what kinds of exercise are right for you. The right exercise, in the right amount, can do wonders. When balanced with your meal plan and medications, exercise will help you feel healthier and happier.      [facility]  [facility]  [address]  [place], [place] [address]  Clinical Nutrition Services    Calcium and Your Health    Calcium is a mineral that helps build strong bones and teeth, helps prevent brittle bones (osteoporosis) and hip fractures, and helps maintain a normal blood pressure. It is very important to get  enough calcium each day.    * CALCIUM NEEDS AT ALL AGES: (RDI 2000)  Infants birth to 12 months is 210 -270 mg/day   Child 1-8 years is 500-.800 mg/day   Adolescent 9-18years is 1300 mg/day   Adults 19-30 years is 1000 mg/day  Adults 31-50 years is 1000 mg/day  Adults 51 and older is 1200 mg/day    * TO MAINTAIN GOOD BONE HEALTH, EAT A CALCIUM RICH DIET:     FOODS    Yogurt, plain, nonfat: 1 cup serving is 450 mgs of calcium   Ricotta cheese, part skim: 1/2 cup serving is 340 mgs of calcium   Milk, skim: 1 cup serving is 300 mgs of calcium   Milk, whole: 1 cup serving is 290 mgs of calcium   Sardines, canned, with bones: 3 oz serving is 280 mgs of calcium   Yogurt, plain, whole milk:1 cup serving is 275 mgs of calcium   Orange juice, calcium fortified:1 cup serving is 270 mgs of calcium   Swiss cheese: 1 oz serving is 270 mgs of calcium   Spinach, cooked: 1 cup serving is 240 mgs of calcium   Turnip greens, rhubarb, cooked:1 cup serving is 200 mgs of calcium   Salmon, canned, with bones: 3 oz serving is 180 mgs of calcium   Ice Cream: 1 cup serving is 175 mgs of calcium   Pudding, instant mix: 1/2 cup serving is 150 mgs of calcium   Almonds: /2 cup serving is 150 mgs of calcium   Kale, cooked: 1 cup serving is 100 mgs of calcium   Lobster, cooked: 6 oz serving is 100 mgs of calcium   Tofu, with calcium sulfate: 3oz serving is 100 mgs of calcium     EXERCISE: Weight bearing exercises, such as walking, jogging, racquet sports, aerobics, and weight training help make bones stronger.     VITAMIN D: Vitamin D is essential to help your body absorb calcium. A healthy body can make its own vitamin D with the help of sunlight. But in the winter months, you may not get enough sun, and should make   sure you get it from another source, like milk or a multivitamin.     HORMONES: The hormone estrogen helps the female body and bones to use calcium. After menopause women need to discuss hormone treatment with their doctor.    * This  is just a first step in helping improve your health.  * For help with a meal plan for you, make an appointment with the Nutritionist.  * For more information, you can also contact the National Nutrition Hotline 1-800-366-1655      9/95 MAS 1/96, 12/97eqj, 01/02, 4/02

## 2005-08-10 NOTE — Progress Notes (Signed)
Chief Complaint:  Loretta Martin is a 44 year old female who presents for a physical exam. Patient denies any current related concerns, except patient states she has been gaining a lot of weight and she does not know why.  Patient planning to go to see female provider for breast and pelvic exam.    Patient Active Problem List:      TUBERCULIN TEST REACTION NO TBC [795.5]   ABNORMAL THYROID FUNCT STUDY [794.5]   DEPRESSIVE DISORDER NEC [311]     No current outpatient prescriptions on file.    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   Comment: 10/97   TUBERCULIN TEST REACTION NO TBC 08/31/2004   Comment: +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 - had negative CXR pp at [facility]    referred to TB clinic   ABNORMAL THYROID FUNCT STUDY 08/31/2004   Comment: increased TSH 4.19 6/00; TSH nl 9/00; 05/04/99 -   wnl   URIN TRACT INFECTION NOS 08/31/2004   Comment: 4/02, 6/02   DEPRESSIVE DISORDER NEC 08/31/2004   Comment: 10/00 to Psych on Paxil, self d/c'd; 9/00 head    CT: scattered punctate calcifications  Past Surgical History:   DIVISION OF FALLOPIAN TUBE 1998    Comment: Tubal Ligation   REDUCTION OF LARGE BREAST    CESAREAN DELIVERY    Comment: twice    Social History   Marital Status: Married Spouse Name:    Years of Education: Number of children: 3     Occupational History  Occupation Radio producer     Social History Main Topics   Tobacco Use: Quit Packs/Day: Years:    Quit date: 08/10/2004   Alcohol Use:  Yes    Comment: sporadically   Drug Use: No    Sexual Activity: Yes Partners with: Female   Birth Control/Protection: Surgical   Comment: back together with husband    Review of patient's family history indicates:   Diabetes Mother       Review of Systems:   Skin: negative  Eyes: negative  Ears/Nose/Throat: negative  Respiratory: negative  Cardiovascular: negative  Gastrointestinal: negative  Genitourinary: negative  Gyn:  normal menses, no abnormal bleeding, pelvic pain or discharge  no breast pain or new or enlarging lumps on self exam  Problems with excessive bleeding have resolved.  Musculoskeletal: negative  Neurologic: negative  Endocrine: negative  Psychiatric: negative  Hematologic/Lymphatic/Immunologic: negative    Physical:  BP 100/70   Temp 98.5   Ht 5\' 4"  (1.57m)   Wt 140 lbs (63.5kg)  General appearance: healthy, alert, well developed, well nourished  Eyes: conjunctivae/corneas clear. PERRL, EOM's intact. Fundi benign  Skin: skin color, texture, turgor are normal  Head: Normocephalic. No masses, lesions, tenderness or abnormalities  Ears: External ears normal. Canals clear. TM's normal.  Nose/Sinuses: Nares normal. Septum midline. Mucosa normal. No drainage or  sinus tenderness.  Oropharynx: Lips, mucosa, and tongue normal. Teeth and gums normal. Oropharynx moist and without lesion  Neck: Neck supple. No adenopathy. Thyroid symmetric, normal size, and without nodularity  Back: Back symmetric, no curvature. ROM normal. No CVA tenderness.  Lungs: Percussion normal. Good diaphragmatic excursion. Lungs clear to auscultation bilaterally  Heart: PMI normal. No lifts, heaves, or thrills. RRR. No murmurs, clicks, gallops or rubs  Breast: Breasts and Pelvis to be done by Gynecology at patient request  Abdomen: Abdomen soft, non-tender. BS normal. No masses, no organomegaly  Extremities: Extremities normal. No deformities, edema, or skin discoloration  Musculoskeletal: Spine ROM normal. Muscular strength  intact.  Peripheral pulses: radial=4/4, femoral=4/4, popliteal=4/4, dorsalis pedis=4/4  Neuro: Gait normal. Reflexes normal and symmetric. Sensation grossly normal     Health Counseling:  Smoking: Reviewed and Discussed  Substance Use Issues: Reviewed and Discussed }  Diet, Exercise, Wt. Control: Reviewed and Discussed  Dental Health: Reviewed and Discussed  Osteoperosis: Reviewed and Discussed  V70.0 ROUTINE MEDICAL EXAM  Plan: LIPID PANEL, THYROID STIM HORMONE, ROUTINE    VENIPUNCTURE    V18.0 FAMILY HX DIABETES MELLITUS  Plan: GLUCOSE RANDOM    V03.7 TETANUS TOXOID INOCULAT  Plan: TD VACCINE > 7, IM, IMMUNIZATION ADMIN SINGLE,    RN    V06.6 VACCINE FOR STREP PNEUM + INFLUENZA  Plan: FLU VACCINE AGE 46 & OVER, IM, IMMUNIZATION    ADMIN EACH ADD, RN

## 2005-08-10 NOTE — Nursing Note (Signed)
>>   Loretta Martin     08/10/2005   11:14 am  Influenza Vaccine Procedure  @FDATE @    1. Has the patient received the information for the influenza vaccine? No    2. Does the patient have any of the following contraindications?  Allergy to eggs? No  Allergic reaction to previous influenza vaccines? No  Any other problems to previous influenza vaccines? No  Paralyzed by Guillain-Barre syndrome?  No  Currently pregnant? No  Current moderate or severe illness? No  Allergy to contact lens solution? No    Td VIS read and questions ans, given as ordered.    3. The vaccine has been administered in the usual fashion and the patient/guardian was instructed to wait 20 minutes before leaving the building in the event of an allergic reaction:     Immunization information and VIS for flu vaccine(s) for 2006-2007 reviewed; verbal consent given by patient/guardian.

## 2005-09-21 ENCOUNTER — Encounter (HOSPITAL_BASED_OUTPATIENT_CLINIC_OR_DEPARTMENT_OTHER): Payer: Self-pay | Admitting: Obstetrics & Gynecology

## 2005-09-21 ENCOUNTER — Ambulatory Visit (HOSPITAL_BASED_OUTPATIENT_CLINIC_OR_DEPARTMENT_OTHER): Payer: No Typology Code available for payment source | Admitting: Obstetrics & Gynecology

## 2005-09-21 VITALS — BP 120/70 | Wt 141.0 lb

## 2005-09-21 DIAGNOSIS — N920 Excessive and frequent menstruation with regular cycle: Secondary | ICD-10-CM

## 2005-09-21 DIAGNOSIS — Z01419 Encounter for gynecological examination (general) (routine) without abnormal findings: Secondary | ICD-10-CM

## 2005-09-21 DIAGNOSIS — A6000 Herpesviral infection of urogenital system, unspecified: Secondary | ICD-10-CM

## 2005-09-21 LAB — URINE DIP (POINT OF CARE)
BILIRUBIN, URINE: NEGATIVE
GLUCOSE, URINE: NEGATIVE (ref 0–0)
KETONE, URINE: NEGATIVE
LEUKOCYTE ESTERASE: NEGATIVE
NITRITE, URINE: POSITIVE
PH URINE: 5 (ref 5.0–8.0)
PROTEIN, URINE: NEGATIVE
SPECIFIC GRAVITY URINE: 1.02 (ref 1.003–1.030)
UROBILINOGEN URINE: 0.2 (ref 0.2–1.0)

## 2005-09-21 LAB — URINE PREGNANCY TEST (POINT OF CARE): HCG QUALITATIVE URINE: NEGATIVE

## 2005-09-21 NOTE — Progress Notes (Signed)
S/ C/O menorrhagia. Also due for yearly exam. Cat: 13 X Q month X 3 days until 2 months ago, started to get heavy bleeding with menses lasting 5 days. No known history of anemia. When the bleeding is heavy takes 1/2 hour to soak a pad. H/O trich syphilis. No abnormal paps. Also C/O nausea and decrease appetite with menses recently. Also see history section. No hot flashes. Had same problem last year for two month which resolved spontaneously. U/S last year normal. Her relationship with her husband has been off and on. She just broke up with him again. The same thing also recently occurred.    O/ LNS neg. Breasts without masses. Thyroid nonpalp. Lungs clear. Cor RRR nl s1 and s2 no murm. Abd soft nontender, no HSM or masses. Ext benign. Nontender top normal size anteverted uterus without adenexal masses or tenderness. Tender ulcerated lesion of right labia minora. Grey vaginal discharge.    A/ New onset menorrhagia in a patient with a significant history of STD's in the past who has an on and off again relationship with her husband. Essentially normal U/S with calcifications in the myometrium and a 2 cm simple cyst in the left adenexa last year when worked up for the same problem according to the patient although report says indication was LLQ pain. Benign exam except grey vaginal discharge.    P/ Check pap, HPV, genprobe, and herpes culture. F/U in 2 weeks to review results. If genprobe negative consider MRI to look for adenomyosis, sonohysterogram, or empiric trial of OCP's. Mammo ordered.

## 2005-09-24 LAB — HERPES CULTURE: HERPES CULTURE RESULT: POSITIVE — AB

## 2005-09-28 ENCOUNTER — Telehealth (HOSPITAL_BASED_OUTPATIENT_CLINIC_OR_DEPARTMENT_OTHER): Payer: Self-pay | Admitting: Obstetrics & Gynecology

## 2005-09-28 NOTE — Telephone Encounter (Signed)
This patient saw me on 09/21/05 and was supposed to get an appointment to see me in 2 weeks before leaving but I don't see the F/U appointment booked in [facility]. Please make sure she gets the F/U appointment booked.

## 2005-10-08 ENCOUNTER — Ambulatory Visit (HOSPITAL_BASED_OUTPATIENT_CLINIC_OR_DEPARTMENT_OTHER): Payer: No Typology Code available for payment source | Admitting: Obstetrics & Gynecology

## 2005-10-08 VITALS — BP 120/70 | Wt 139.0 lb

## 2005-10-08 DIAGNOSIS — A6000 Herpesviral infection of urogenital system, unspecified: Secondary | ICD-10-CM

## 2005-10-08 DIAGNOSIS — N920 Excessive and frequent menstruation with regular cycle: Secondary | ICD-10-CM

## 2005-10-08 MED ORDER — LO/OVRAL (28) 0.3-30 MG-MCG PO TABS
ORAL_TABLET | ORAL | Status: DC
Start: 2005-10-08 — End: 2006-03-29

## 2005-10-08 MED ORDER — ACYCLOVIR 400 MG PO TABS
ORAL_TABLET | ORAL | Status: DC
Start: 2005-10-08 — End: 2006-09-14

## 2005-10-08 NOTE — Progress Notes (Signed)
S/ Here to review results. No longer has a vulvar sore.    O. See herpes culture and ganprobe. Pap pending.    A/ 1. Menorrhagia with negative genprobe.    2. Genital Herpes.    P/ 1. Trial of lo/ovral. F/U in 4 months to see if results satisfactory.    2. Rx for PRN acyclovir given.

## 2005-10-12 ENCOUNTER — Encounter (HOSPITAL_BASED_OUTPATIENT_CLINIC_OR_DEPARTMENT_OTHER): Payer: Self-pay | Admitting: Obstetrics & Gynecology

## 2005-10-27 LAB — CHLAMYDIA GC NAAT
GENPROBE CHLAMYDIA: NEGATIVE
GENPROBE GC: NEGATIVE

## 2005-10-27 LAB — CYTOPATH, C/V, THIN LAYER

## 2005-10-27 LAB — HUMAN PAPILLOMAVIRUS (HPV): HUMAN PAPILLOMAVIRUS: NEGATIVE

## 2005-11-02 ENCOUNTER — Ambulatory Visit (HOSPITAL_BASED_OUTPATIENT_CLINIC_OR_DEPARTMENT_OTHER): Payer: No Typology Code available for payment source | Admitting: Internal Medicine

## 2005-11-02 ENCOUNTER — Telehealth (HOSPITAL_BASED_OUTPATIENT_CLINIC_OR_DEPARTMENT_OTHER): Payer: Self-pay | Admitting: Ambulatory Care

## 2005-11-02 VITALS — Temp 97.7°F | Wt 139.0 lb

## 2005-11-02 DIAGNOSIS — J329 Chronic sinusitis, unspecified: Secondary | ICD-10-CM

## 2005-11-02 MED ORDER — FLONASE 50 MCG/DOSE NA INHA
NASAL | Status: DC
Start: 2005-11-02 — End: 2005-11-30

## 2005-11-02 NOTE — Progress Notes (Signed)
Loretta Martin is a 45 year old female  The patient presents with 3 days of facial ha  Developed a cold about a week ago  Assoc with rhinnitis, nasal congestion, cough  Symptoms improved but then developed a strong pain around eyes, nose and cheeks  No ear pain  Denies feeling congested  When ha pain severe, she gets watery eyes and rhinnitis  Tylenol resolves pain    Current outpatient prescriptions prior to 11/02/05:    LO/OVRAL (28) 0.3-30 MG-MCG OR TABS, one daily, Disp: 3, Rfl: 3    Patient Active Problem List:   TUBERCULIN TEST REACTION NO TBC [795.5]   DEPRESSIVE DISORDER NEC [311]    OBJECTIVE:  Nad  Sinus tenderness on maxillary and nasal bridge  Nasal turbinates red and swollen  Throat red w/o exudates  Tm's cl  Neck supple  Lungs cl  Heart RRR    473.9 CHRONIC SINUSITIS NOS (primary encounter diagnosis)  Note: I will treat for sinus congestion, if no improvement, may need to consider cluster ha  Plan: FLONASE 50 MCG/DOSE NA INHA   Cont with tylenol prn  Increase fluids  Call if no improvement

## 2005-11-02 NOTE — Telephone Encounter (Signed)
Staff Message copied by Audrie Gallus on 11/02/2005 at 1:02 PM  ------   Message from: Angus Palms   Created: 11/02/2005 at 12:39 PM   Regarding: ill   Contact: [phone]    Wilhelmina Hark [id number], 45 year old, female, [phone] (home) [phone] (work)    CALL BACK NUMBER: cell# [phone]  Cell phone:   Other phone:no other    Available times:    Patient's language of care: [place]    Patient does not need an interpreter.    Patient's PCP: Arlice Colt, MD, MD    Person calling on behalf of patient: patient (self)    Calls today with a sick call.strong headaches for the pass five days

## 2005-11-02 NOTE — Telephone Encounter (Signed)
5 days has had a headache . Taking tylenol with little effect . Getting stronger . Forehead and eyes . Appoint made with dr Rochele Raring at 3:50 pm .

## 2005-11-29 ENCOUNTER — Telehealth (HOSPITAL_BASED_OUTPATIENT_CLINIC_OR_DEPARTMENT_OTHER): Payer: Self-pay | Admitting: Infectious Diseases

## 2005-11-29 NOTE — Telephone Encounter (Signed)
Staff Message copied by Bevelyn Buckles on 11/29/2005 at 9:44 AM  ------   Message from: Merrilee Jansky, RITA   Created: 11/29/2005 at 9:39 AM   Regarding: HEAVY BREATHING,COUGHING ,CONGESTION   Contact: [phone]    Kammie Scioli [id number], 45 year old, female, [phone] (home) [phone] (work)    CALL BACK NUMBER:  Cell phone:   Other phone:    Available times:    Patient's language of care: [place]    Patient does not need an interpreter.    Patient's PCP: Arlice Colt, MD, MD    Person calling on behalf of patient: patient (self)    Calls today with a sick call.

## 2005-11-29 NOTE — Telephone Encounter (Signed)
Staff Message copied by Bevelyn Buckles on 11/29/2005 at 9:45 AM  ------   Message from: Merrilee Jansky, RITA   Created: 11/29/2005 at 9:39 AM   Regarding: HEAVY BREATHING,COUGHING ,CONGESTION   Contact: [phone]    Loretta Martin [id number], 45 year old, female, [phone] (home) [phone] (work)    CALL BACK NUMBER:  Cell phone:   Other phone:    Available times:    Patient's language of care: [place]    Patient does not need an interpreter.    Patient's PCP: Arlice Colt, MD, MD    Person calling on behalf of patient: patient (self)    Calls today with a sick call.

## 2005-11-29 NOTE — Telephone Encounter (Signed)
C/o coughing and chest congestion since Wednesday. Minimal relief w/Robitussin x 1 hr. Dry, non-prod. Cough and has been occ. Sob though denies this now. Afebrile. Denies nasal congestion or chest discomf.  Requests eval tomorrow.    Advised cont. Cough med/ cold relief, inc. Fluids, call if sx worsen.  F/u w/PCP tomorrow

## 2005-11-30 ENCOUNTER — Ambulatory Visit (HOSPITAL_BASED_OUTPATIENT_CLINIC_OR_DEPARTMENT_OTHER): Payer: No Typology Code available for payment source | Admitting: Internal Medicine

## 2005-11-30 VITALS — BP 110/80 | Temp 98.1°F | Wt 140.0 lb

## 2005-11-30 DIAGNOSIS — J069 Acute upper respiratory infection, unspecified: Secondary | ICD-10-CM

## 2005-11-30 DIAGNOSIS — J329 Chronic sinusitis, unspecified: Secondary | ICD-10-CM

## 2005-11-30 MED ORDER — FLONASE 50 MCG/DOSE NA INHA
NASAL | Status: DC
Start: 2005-11-30 — End: 2006-03-07

## 2005-11-30 NOTE — Progress Notes (Signed)
SUBJECTIVE:   Izella Ybanez is a 46 year old female who complains of sore throat, post nasal drip, headache and dry cough for 6 days. Had some laryngitis. Can't breath because of severity of cough. She denies a history of fevers, nausea and vomiting and does not have a history of asthma. Patient does not smoke cigarettes.   Not using flonase regularly    OBJECTIVE:  She appears well, vital signs are as noted by the nurse. Ears normal. Throat and pharynx normal. Neck supple. mild adenopathy in the neck. Nose is uncongested, but does have some cobblestoning. Sinuses non tender. The chest is clear, without wheezes or rales.    473.9 CHRONIC SINUSITIS NOS  Note: patient educated about use of this as a chronic med. Will try taking regularly for 1 month and seeing how she feels  Plan: FLONASE 50 MCG/DOSE NA INHA    465.9 ACUTE URI NOS  Note: laryngitis- viral no acute tx    Will notify [facility] as patient is overdue for breast screening.

## 2006-01-11 ENCOUNTER — Ambulatory Visit (HOSPITAL_BASED_OUTPATIENT_CLINIC_OR_DEPARTMENT_OTHER): Payer: No Typology Code available for payment source | Admitting: Obstetrics & Gynecology

## 2006-01-11 VITALS — BP 120/82 | Wt 137.0 lb

## 2006-01-11 DIAGNOSIS — N926 Irregular menstruation, unspecified: Secondary | ICD-10-CM

## 2006-01-11 MED ORDER — LO/OVRAL (28) 0.3-30 MG-MCG PO TABS
ORAL_TABLET | ORAL | Status: DC
Start: 2006-01-11 — End: 2006-11-15

## 2006-01-11 NOTE — Progress Notes (Signed)
S/ Here for F/U regarding bleeding. Good response to lo/ovral. No C/O.    O/ pap ASCUS with neg HPV 12/06.    A/P 1. CPM with lo/ovral    2. F/U 12/07 for yearly.

## 2006-01-20 ENCOUNTER — Telehealth (HOSPITAL_BASED_OUTPATIENT_CLINIC_OR_DEPARTMENT_OTHER): Payer: Self-pay | Admitting: Ambulatory Care

## 2006-01-20 NOTE — Telephone Encounter (Signed)
Staff Message copied by Audrie Gallus on 01/20/2006 at 1:44 PM  ------   Message from: Merrilee Jansky, RITA   Created: 01/20/2006 at 1:09 PM   Regarding: pain on her leg   Contact: [phone]    Loretta Martin [id number], 45 year old, female, [phone] (home) [phone] (work)    CALL BACK NUMBER:   Cell phone:   Other phone:    Available times:    Patient's language of care: [place]    Patient does not need an interpreter.    Patient's PCP: Arlice Colt, MD, MD    Person calling on behalf of patient: patient (self)    Calls today with a sick call.  Wants to be senn

## 2006-01-20 NOTE — Telephone Encounter (Signed)
Tc lm to call [phone] .

## 2006-01-21 ENCOUNTER — Ambulatory Visit (HOSPITAL_BASED_OUTPATIENT_CLINIC_OR_DEPARTMENT_OTHER): Payer: No Typology Code available for payment source | Admitting: Internal Medicine

## 2006-01-21 VITALS — HR 76 | Temp 98.5°F | Resp 16 | Wt 139.0 lb

## 2006-01-21 DIAGNOSIS — Z1239 Encounter for other screening for malignant neoplasm of breast: Secondary | ICD-10-CM

## 2006-01-21 DIAGNOSIS — M5430 Sciatica, unspecified side: Secondary | ICD-10-CM

## 2006-01-21 MED ORDER — IBUPROFEN 800 MG PO TABS
ORAL_TABLET | ORAL | Status: AC
Start: 2006-01-21 — End: 2006-02-20

## 2006-01-21 MED ORDER — MEDROL (PAK) 4 MG PO TABS
ORAL_TABLET | ORAL | Status: AC
Start: 2006-01-21 — End: 2006-01-28

## 2006-01-21 NOTE — Telephone Encounter (Signed)
Tc lm to call [phone] .

## 2006-01-21 NOTE — Telephone Encounter (Signed)
Last Monday had lower back pain . Pain went to left leg . Difficulty moving . Would like to see a provider today . Appoint made this morning with dr Lowella Grip .

## 2006-01-21 NOTE — Progress Notes (Signed)
Loretta Martin is a 45 year old female who complains of pain on the posterior side of her left thigh radiating down to her calf for the past three days. 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 She denies trauma. She denies weakness, GI or GU symptoms.     Reviewing her health maintenance, she is due for mammography.    ROS: GI neg; GU neg; Resp neg; CV neg; Psych neg; Neuro neg; Endo neg; Heme-Onc neg.    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   Comment: 10/97   TUBERCULIN TEST REACTION NO TBC 08/31/2004   Comment: +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 - had negative CXR pp at [facility]    referred to TB clinic   ABNORMAL THYROID FUNCT STUDY 08/31/2004   Comment: increased TSH 4.19 6/00; TSH nl 9/00; 05/04/99 -   wnl   URIN TRACT INFECTION NOS 08/31/2004   Comment: 4/02, 6/02   DEPRESSIVE DISORDER NEC 08/31/2004   Comment: 10/00 to Psych on Paxil, self d/c'd; 9/00 head    CT: scattered punctate calcifications  Past Surgical History:   DIVISION OF FALLOPIAN TUBE 1998    Comment: Tubal Ligation   REDUCTION OF LARGE BREAST    CESAREAN DELIVERY    Comment: twice  Current outpatient prescriptions prior to 01/21/06:  FLONASE 50 MCG/DOSE NA INHA, use one spray in each nostril bid, Disp: 1, Rfl: 1  LO/OVRAL (28) 0.3-30 MG-MCG OR TABS, one daily, Disp: 3, Rfl: 3  LO/OVRAL (28) 0.3-30 MG-MCG OR TABS, one daily, Disp: 3, Rfl: 2  ACYCLOVIR 400 MG OR TABS, one tablet three times a day for 5 days PRN , Disp: 15, Rfl: 6    Social History   Marital Status: Married Spouse Name:    Years of Education: Number of children: 3     Occupational History  Occupation Radio producer     Social History Main Topics   Tobacco Use: Quit Packs/Day: Years:    Quit date: 08/10/2004   Comment: smoked only for 6 months.   Alcohol Use: Yes    Comment: sporadically   Drug Use: No    Sexual Activity: Not Currently Partners  with: Female   Birth Control/Protection: Surgical   Comment: separated again from her husband.    YQ:MVHQI 76   Temp 98.5   Resp 16   Wt 139 lbs (63.1kg)  Alert female with antalgic gait.  BREASTS: s/p bilateral breast reduction surgery. No masses, nodules. Nipples and areola normal; Axillae normal  BACK: no bony or muscle tenderness and no muscle spasm. Flexion 80 degrees with pain. Straight leg raising: 80 degrees on left with pain; right normal; DTR's normal; Strength normal.  LEFT HIP: normal flexion and AB duction without pain.    Imp and Plan:  724.3 SCIATICA (primary encounter diagnosis)  Plan: IBUPROFEN 800 MG OR TABS, MEDROL (PAK) 4 MG OR    TABS   Heat, rest.    V76.10 SCREENING MAL NEOP-BREAST NOS  Plan: Mammogram ordered.

## 2006-02-07 ENCOUNTER — Telehealth (HOSPITAL_BASED_OUTPATIENT_CLINIC_OR_DEPARTMENT_OTHER): Payer: Self-pay | Admitting: Internal Medicine

## 2006-02-07 ENCOUNTER — Other Ambulatory Visit: Payer: Self-pay

## 2006-02-07 DIAGNOSIS — Z1231 Encounter for screening mammogram for malignant neoplasm of breast: Secondary | ICD-10-CM

## 2006-02-07 DIAGNOSIS — R920 Mammographic microcalcification found on diagnostic imaging of breast: Secondary | ICD-10-CM

## 2006-02-07 NOTE — Telephone Encounter (Signed)
Spoke with patient to notify of abnormal mammogram and need to get biopsy. Described sterotactic biopsy procedure and explained that she will be seen in breast center to further discuss immediately following exam.  Arranged stereotactic biopsy for Wednesday.

## 2006-02-08 LAB — MA SCREENING MAMMO BILATERAL WITH CAD

## 2006-02-09 ENCOUNTER — Other Ambulatory Visit (HOSPITAL_BASED_OUTPATIENT_CLINIC_OR_DEPARTMENT_OTHER): Payer: Self-pay | Admitting: Internal Medicine

## 2006-02-11 ENCOUNTER — Encounter (HOSPITAL_BASED_OUTPATIENT_CLINIC_OR_DEPARTMENT_OTHER): Payer: Self-pay | Admitting: Internal Medicine

## 2006-02-11 DIAGNOSIS — R920 Mammographic microcalcification found on diagnostic imaging of breast: Secondary | ICD-10-CM | POA: Insufficient documentation

## 2006-02-11 LAB — SURGICAL PATH SPECIMEN

## 2006-02-15 LAB — BREAST CLIP PLACEMENT

## 2006-02-15 LAB — STEREOTACTIC BREAST BIOPSY

## 2006-02-15 LAB — STEREOTACTIC GUIDANCE

## 2006-02-15 LAB — MA BREAST SPECIMEN

## 2006-02-16 ENCOUNTER — Ambulatory Visit (HOSPITAL_BASED_OUTPATIENT_CLINIC_OR_DEPARTMENT_OTHER): Payer: Self-pay | Admitting: Surgical Oncology

## 2006-02-16 DIAGNOSIS — N6019 Diffuse cystic mastopathy of unspecified breast: Secondary | ICD-10-CM

## 2006-02-16 LAB — TCH BREAST CTR CLINIC NOTE

## 2006-02-18 ENCOUNTER — Telehealth (HOSPITAL_BASED_OUTPATIENT_CLINIC_OR_DEPARTMENT_OTHER): Payer: Self-pay | Admitting: Ambulatory Care

## 2006-02-18 NOTE — Telephone Encounter (Signed)
Staff Message copied by Audrie Gallus on 02/18/2006 at 11:14 AM  ------   Message from: Angus Palms   Created: 02/18/2006 at 11:09 AM   Regarding: cough   Contact: [phone]    Kirstan Fentress [id number], 45 year old, female, [phone] (home) [phone] (work)    CALL BACK NUMBER: cell# [phone]  Cell phone:   Other phone:    Available times:    Patient's language of care: [place]    Patient does not need an interpreter.    Patient's PCP: Arlice Colt, MD, MD    Person calling on behalf of patient: patient (self)    Calls today with a sick call.patient stating that she has bad cough for two days    Patient's Preferred Pharmacy:   No Pharmacies Listed

## 2006-02-18 NOTE — Telephone Encounter (Signed)
Cough for 2 days . Took robitussin .no fever . No cold . Sleeps o k at night . Concerned because she has not been sick and the cough is constant . Would like to see pcp on Monday . Is at work now . Appoint made for Monday with dr green .

## 2006-02-21 ENCOUNTER — Ambulatory Visit (HOSPITAL_BASED_OUTPATIENT_CLINIC_OR_DEPARTMENT_OTHER): Payer: Self-pay | Admitting: Internal Medicine

## 2006-03-07 ENCOUNTER — Other Ambulatory Visit (HOSPITAL_BASED_OUTPATIENT_CLINIC_OR_DEPARTMENT_OTHER): Payer: Self-pay | Admitting: Ambulatory Care

## 2006-03-07 DIAGNOSIS — J329 Chronic sinusitis, unspecified: Secondary | ICD-10-CM

## 2006-03-07 MED ORDER — FLONASE 50 MCG/DOSE NA INHA
NASAL | Status: DC
Start: 2006-03-07 — End: 2006-03-29

## 2006-03-07 NOTE — Telephone Encounter (Signed)
Last seen by pcp 01/21/06

## 2006-03-08 NOTE — Telephone Encounter (Signed)
Prescription(s) called into pharmacy vm as ordered.

## 2006-03-29 ENCOUNTER — Ambulatory Visit (HOSPITAL_BASED_OUTPATIENT_CLINIC_OR_DEPARTMENT_OTHER): Payer: No Typology Code available for payment source | Admitting: Internal Medicine

## 2006-03-29 VITALS — BP 116/94 | HR 100 | Temp 98.7°F | Ht 61.75 in | Wt 139.0 lb

## 2006-03-29 DIAGNOSIS — R05 Cough: Principal | ICD-10-CM

## 2006-03-29 DIAGNOSIS — J329 Chronic sinusitis, unspecified: Secondary | ICD-10-CM

## 2006-03-29 DIAGNOSIS — R059 Cough, unspecified: Secondary | ICD-10-CM

## 2006-03-29 MED ORDER — FLONASE 50 MCG/DOSE NA INHA
NASAL | Status: DC
Start: 2006-03-29 — End: 2007-06-08

## 2006-03-29 MED ORDER — PROAIR HFA 108 (90 BASE) MCG/ACT IN AERS
INHALATION_SPRAY | RESPIRATORY_TRACT | Status: DC
Start: 2006-03-29 — End: 2007-06-08

## 2006-03-29 NOTE — Progress Notes (Signed)
SUBJECTIVE:   Loretta Martin is a 45 year old female who complains of dry cough which comes in waves. Began in April and has been happening almost every day. Feels short of breath sometimes too. Has some paroxysms of cough that make it difficult for her to breath. Chest hurts from coughing. Used robitussin which helped. She denies a history of fevers and vomiting, but sometimes has nausea and chills unrelated to the cough. No Gerd. Does not have a history of asthma. 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 Patient does not smoke cigarettes. Has a hx of nasal allergies and used to sneeze in the house with a cat.     OBJECTIVE:  She appears well, vital signs are as noted by the nurse. Ears normal. Throat and pharynx normal. Neck supple. No adenopathy in the neck. Nose is congested. Sinuses non tender. The chest is clear, without wheezes or rales.    ASSESSMENT:     473.9 CHRONIC SINUSITIS NOS  Note: refilled  Plan: FLONASE 50 MCG/DOSE NA INHA    786.2 COUGH  Note: bronchospasm  Plan: PROAIR HFA 108 (90 BASE) MCG/ACT IN AERS   Trial. Educated re avoidance of triggers

## 2006-06-27 ENCOUNTER — Encounter (HOSPITAL_BASED_OUTPATIENT_CLINIC_OR_DEPARTMENT_OTHER): Payer: Self-pay | Admitting: Nurse Practitioner

## 2006-06-27 ENCOUNTER — Ambulatory Visit (HOSPITAL_BASED_OUTPATIENT_CLINIC_OR_DEPARTMENT_OTHER): Payer: No Typology Code available for payment source | Admitting: Nurse Practitioner

## 2006-06-27 ENCOUNTER — Other Ambulatory Visit (HOSPITAL_BASED_OUTPATIENT_CLINIC_OR_DEPARTMENT_OTHER): Payer: Self-pay | Admitting: "Women's Health Care

## 2006-06-27 VITALS — BP 100/70 | Wt 138.0 lb

## 2006-06-27 DIAGNOSIS — N920 Excessive and frequent menstruation with regular cycle: Secondary | ICD-10-CM

## 2006-06-27 NOTE — Telephone Encounter (Signed)
Pt walked into clinic requesting refill on her OCP's. LMP-06/21/06, denies ACHES, had a few missed/skipped pills but doubled up the next day, and is a non-smoker.     Last pap was 12/06 ASCUS with negative HPV

## 2006-06-27 NOTE — Telephone Encounter (Signed)
I spoke with [name] and she wants to see Loretta Martin. I informed Loretta Martin of that, asked Loretta Martin if there was anything else. Loretta Martin states she was supposed to start her pills yesterday.

## 2006-06-27 NOTE — Patient Instructions (Signed)
Dr. Raylene Miyamoto 6-8 wks for continued menorrhagia on OCP's

## 2006-06-27 NOTE — Progress Notes (Signed)
45 year old  [id number]    Pt walks in for more OCP's - started on trial of LoOvral therapuetically by Dr. Raylene Miyamoto as rx for menorrhagia not contraception ( not SA, has had BTL), pt reports initially menses lighter and shorter on OCPs, after 6 months menses again 5 -7 days and much heavier with clots, this past month is also continuing to spot for 5 days since menses, no late or missed pills, get lower pelvic and back pain with menses    Has nl pelvic u/s a few yrs ago, at 1/06 consult with Dr. Raylene Miyamoto - consideration of MRI or sonohystogram for question of adenomyosis then, was to RTC for reassessment and never did    O: deferred  A: OK to continue OCP's, but needs f/u with gyn MD to reassess menorrhagia    P: LoOvral x 3   F/u with Dr. Raylene Miyamoto

## 2006-07-18 ENCOUNTER — Encounter (HOSPITAL_BASED_OUTPATIENT_CLINIC_OR_DEPARTMENT_OTHER): Payer: Self-pay | Admitting: Internal Medicine

## 2006-08-04 ENCOUNTER — Encounter (HOSPITAL_BASED_OUTPATIENT_CLINIC_OR_DEPARTMENT_OTHER): Payer: Self-pay | Admitting: Internal Medicine

## 2006-08-17 ENCOUNTER — Encounter (HOSPITAL_BASED_OUTPATIENT_CLINIC_OR_DEPARTMENT_OTHER): Payer: No Typology Code available for payment source | Admitting: Obstetrics & Gynecology

## 2006-09-14 ENCOUNTER — Ambulatory Visit (HOSPITAL_BASED_OUTPATIENT_CLINIC_OR_DEPARTMENT_OTHER): Payer: No Typology Code available for payment source | Admitting: Obstetrics & Gynecology

## 2006-09-14 ENCOUNTER — Encounter (HOSPITAL_BASED_OUTPATIENT_CLINIC_OR_DEPARTMENT_OTHER): Payer: Self-pay | Admitting: Obstetrics & Gynecology

## 2006-09-14 VITALS — BP 100/70 | Wt 140.0 lb

## 2006-09-14 DIAGNOSIS — N938 Other specified abnormal uterine and vaginal bleeding: Secondary | ICD-10-CM

## 2006-09-14 DIAGNOSIS — N949 Unspecified condition associated with female genital organs and menstrual cycle: Principal | ICD-10-CM

## 2006-09-14 DIAGNOSIS — N925 Other specified irregular menstruation: Secondary | ICD-10-CM

## 2006-09-14 LAB — URINE PREGNANCY TEST (POINT OF CARE): HCG QUALITATIVE URINE: NEGATIVE

## 2006-09-14 NOTE — Progress Notes (Signed)
PATIENT/PROCEDURE VERIFICATION DOCUMENTATION    Correct patient: Yes  Correct procedure: Yes  Correct side, site, mark visible if applicable: Yes  Correct position: Yes  Special equipment/implant(s) present, if applicable: Yes    Time-out completed, documented by provider doing procedure or designated team member:  Lollie Sails 09/14/2006 3:44 PM

## 2006-09-14 NOTE — Progress Notes (Signed)
S/ Still on lo/ovral. Having heavy menses alternating with normal menses. Denies metrorrhagia. Feels the bleeding is well enough controlled on the lo/ovral. H/O 2 C/Ss followed by a VBAC. LMP started today. Due for a yearly exam. Still not smoking.    O/ deferred    A/ 45 Y/O healthy nonsmoker with episodic menorrhagia reasonably well controlled on lo/ovral. Yearly exam deferred today due to menses.    P/ CPM with lo/ovral. F/U for a yearly exam. Consider MRI to look for adenomyosis if symptoms worsen.

## 2006-11-15 ENCOUNTER — Ambulatory Visit (HOSPITAL_BASED_OUTPATIENT_CLINIC_OR_DEPARTMENT_OTHER): Payer: No Typology Code available for payment source | Admitting: Obstetrics & Gynecology

## 2006-11-15 VITALS — BP 114/72 | Wt 140.0 lb

## 2006-11-15 DIAGNOSIS — Z01419 Encounter for gynecological examination (general) (routine) without abnormal findings: Secondary | ICD-10-CM

## 2006-11-15 MED ORDER — LO/OVRAL (28) 0.3-30 MG-MCG PO TABS
ORAL_TABLET | ORAL | Status: AC
Start: 2006-11-15 — End: 2007-11-15

## 2006-11-15 NOTE — Progress Notes (Signed)
S/ Here for a routine pelvic exam. Has already had a general yearly exam by her PCP. The lo/ovral is working well. Needs a refill.    O/ external genitalia normal. Vagina and cervix appear normal. 6 week size anteverted uterus without adenexal masses or tenderness. Pap and HPV done.    A/ 1. RHM    2. Menorrhagia well controlled on lo/ovral.    P/ 1. check pap and HPV. Mammo ordered for 5/08    2. Refill on lo/ovral given

## 2006-11-15 NOTE — Progress Notes (Addendum)
Addended byDavid Stall on: 11/15/2006 4:36:53 PM     Modules accepted: Orders

## 2006-11-21 LAB — HUMAN PAPILLOMAVIRUS (HPV): HUMAN PAPILLOMAVIRUS: NEGATIVE

## 2006-11-28 LAB — CYTOPATH, C/V, THIN LAYER

## 2006-11-29 ENCOUNTER — Encounter (HOSPITAL_BASED_OUTPATIENT_CLINIC_OR_DEPARTMENT_OTHER): Payer: Self-pay | Admitting: Obstetrics & Gynecology

## 2007-02-15 ENCOUNTER — Ambulatory Visit (HOSPITAL_BASED_OUTPATIENT_CLINIC_OR_DEPARTMENT_OTHER): Payer: Self-pay | Admitting: Surgical Oncology

## 2007-02-15 DIAGNOSIS — N6019 Diffuse cystic mastopathy of unspecified breast: Secondary | ICD-10-CM

## 2007-02-15 LAB — MA SCREENING MAMMO BILATERAL WITH CAD

## 2007-02-16 LAB — TCH BREAST CTR CLINIC NOTE

## 2007-03-27 ENCOUNTER — Ambulatory Visit (HOSPITAL_BASED_OUTPATIENT_CLINIC_OR_DEPARTMENT_OTHER): Payer: No Typology Code available for payment source | Admitting: Internal Medicine

## 2007-03-27 ENCOUNTER — Encounter (HOSPITAL_BASED_OUTPATIENT_CLINIC_OR_DEPARTMENT_OTHER): Payer: Self-pay | Admitting: Internal Medicine

## 2007-03-27 VITALS — BP 102/58 | Temp 98.4°F | Wt 137.0 lb

## 2007-03-27 DIAGNOSIS — F3289 Other specified depressive episodes: Secondary | ICD-10-CM

## 2007-03-27 DIAGNOSIS — F329 Major depressive disorder, single episode, unspecified: Secondary | ICD-10-CM

## 2007-03-27 DIAGNOSIS — R197 Diarrhea, unspecified: Secondary | ICD-10-CM

## 2007-03-27 LAB — BLOOD COUNT COMPLETE AUTOMATED
HEMATOCRIT: 38 % (ref 36.0–48.0)
HEMOGLOBIN: 12.7 g/dl (ref 12.0–16.0)
MEAN CORP HGB CONC: 33.5 g/dl (ref 32.0–36.0)
MEAN CORPUSCULAR HGB: 32.8 pg (ref 27.0–33.0)
MEAN CORPUSCULAR VOL: 98.1 fl (ref 80.0–100.0)
MEAN PLATELET VOLUME: 8.7 fl (ref 6.4–10.8)
PLATELET COUNT: 194 10*3/uL (ref 150–400)
RBC DISTRIBUTION WIDTH: 13.1 % (ref 11.5–14.3)
RED BLOOD CELL COUNT: 3.88 M/uL — ABNORMAL LOW (ref 4.50–5.10)
WHITE BLOOD CELL COUNT: 5.6 10*3/uL (ref 4.0–10.8)

## 2007-03-27 LAB — COMPREHENSIVE METABOLIC PANEL
ALANINE AMINOTRANSFERASE: 16 IU/L (ref 7–35)
ALBUMIN: 3.8 g/dl (ref 3.4–4.8)
ALKALINE PHOSPHATASE: 31 IU/L (ref 25–106)
ANION GAP: 6 mmol/L (ref 2–25)
ASPARTATE AMINOTRANSFERASE: 21 IU/L (ref 8–34)
BILIRUBIN TOTAL: 0.4 mg/dl (ref 0.2–1.1)
BUN (UREA NITROGEN): 11 mg/dl (ref 6–20)
CALCIUM: 9.2 mg/dl (ref 8.6–10.0)
CARBON DIOXIDE: 27 mmol/L (ref 22–32)
CHLORIDE: 107 mmol/L (ref 101–111)
CREATININE: 0.8 mg/dl (ref 0.4–1.2)
ESTIMATED GLOMERULAR FILT RATE: >60 mL/min (ref 60–116)
Glucose Random: 144 mg/dl (ref 74–160)
POTASSIUM: 4.5 mmol/L (ref 3.5–5.1)
SODIUM: 140 mmol/L (ref 135–144)
TOTAL PROTEIN: 6.8 g/dl (ref 5.9–7.5)

## 2007-03-27 MED ORDER — METAMUCIL 30.9 % PO POWD
ORAL | Status: DC
Start: 2007-03-27 — End: 2007-10-18

## 2007-03-27 NOTE — Progress Notes (Signed)
Loretta Martin is a 46 year old female who comes in with two overlapping complaints.  1) No energy. Feeling tired all of the time. This has been Going on for 6 months or more. Sleeps "too much." Sleeps 9 hours on a working day, and even more on weekends. Sometimes sad, but does not feel "depressed" and she has not had recent stressors.  2) Having watery dirrhea, having some fecal urgency, but no incontinence. Originally states no abdominal pains or cramps.  No fevers or chills. Subsequently reports sharp pain at abdomen and sensation of needles poking in belly which travels to chest. These sx resolve spontaneously in 10 minutes "as if it were never there." 3 episodes in last 3 months.  She believes she has lost weight, but weight is at low point of typical variation.     No recent travel. No melena, or BRBPR. Patient is very worried because her Grandmother had diarrhea, and "died from it."    No recent changes in Diet, work, and home situation.  ROS: No TIA's or unusual headaches, no dysphagia. No prolonged cough. No dyspnea or chest pain on exertion. Normal menses, no abnormal vaginal bleeding, discharge or unexpected pelvic pain.    OBJECTIVE:  BP 102/58   Temp 98.4 F (36.9 C)   Wt 137 lb (62.143 kg)  The physical exam is generally normal. Patient alert and oriented x 3, pleasant, cooperative. Neck supple and free of adenopathy, or masses. No thyromegaly. Throat normal. Chest is clear to IPPA. Heart sounds are normal, no murmurs, clicks, gallops or rubs. Abdomen is soft, no tenderness, masses or organomegaly.    787.91 Diarrhea (primary encounter diagnosis)  Comment: Differential is enormous. No clear evidence of malnutrition although patient has lost weight. She may need colonoscopy as shee is nearing age when this should be done anyway. Will look for fecal leukocytes, and chemistry imbalances.  Depression has been a problem in the past, and might explain fatigue and weight loss, but patient does not describe  any recent change. IBD is also possible. Patient encouraged to try gradually increasing metamucil.    Plan: COMPREHENSIVE METABOLIC PANEL, COMPLETE CBC,    AUTOMATED, ROUTINE VENIPUNCTURE, METAMUCIL 30.9   % OR POWD

## 2007-03-29 ENCOUNTER — Encounter (HOSPITAL_BASED_OUTPATIENT_CLINIC_OR_DEPARTMENT_OTHER): Payer: Self-pay | Admitting: Internal Medicine

## 2007-03-29 ENCOUNTER — Ambulatory Visit (HOSPITAL_BASED_OUTPATIENT_CLINIC_OR_DEPARTMENT_OTHER): Payer: No Typology Code available for payment source | Admitting: Lab

## 2007-03-29 DIAGNOSIS — R197 Diarrhea, unspecified: Secondary | ICD-10-CM

## 2007-03-29 NOTE — Progress Notes (Signed)
Lab completed  kd

## 2007-03-30 LAB — FECAL LEUKOCYTE SCREEN: PROCEDURE PROMPT: NONE SEEN

## 2007-04-17 ENCOUNTER — Ambulatory Visit (HOSPITAL_BASED_OUTPATIENT_CLINIC_OR_DEPARTMENT_OTHER): Payer: No Typology Code available for payment source | Admitting: Internal Medicine

## 2007-04-17 VITALS — BP 90/64 | Temp 98.2°F | Wt 136.0 lb

## 2007-04-17 DIAGNOSIS — R197 Diarrhea, unspecified: Secondary | ICD-10-CM

## 2007-04-17 DIAGNOSIS — K589 Irritable bowel syndrome without diarrhea: Secondary | ICD-10-CM

## 2007-04-17 NOTE — Progress Notes (Signed)
Loretta Martin is a 46 year old female who comes in for follow up of watery dirrhea, fecal urgency, but no incontinence. Never had abdominal pains, cramps, fevers or chills.   Patient has increased her fiber intake and she is having bulkier, soft stools. No further abdo pains. Does have questions about fiber intake, (her son bought fiber powder different than the metamucil she is currently taking.) We discussed different methods of increasing fiber including diet changes and OTC meds, and beneficial effects. Discussed importance of adequate water intake.    Reviewed lab tests which were all normal.    OBJECTIVE:  BP 90/64   Temp 98.2 F (36.8 C)   Wt 136 lb (61.689 kg)  Patient is alert talkative and pleasnt.    564.1U IBS (Irritable Bowel Syndrome) (primary encounter diagnosis)  Comment: improved with increased fiber     > 50 % of this visit spent in counselling  Face to face 15 minutes  I have reviewed the past medical, surgical, social and family history and updated these sections of EpicCare as relevant. All interim labs, test results, and consult notes were reviewed and discussed with Loretta Martin. Medications were reconciled during this visit and a current medication list was given to the patient at the end of the visit.

## 2007-06-08 ENCOUNTER — Other Ambulatory Visit (HOSPITAL_BASED_OUTPATIENT_CLINIC_OR_DEPARTMENT_OTHER): Payer: Self-pay

## 2007-06-08 DIAGNOSIS — R05 Cough: Secondary | ICD-10-CM

## 2007-06-08 DIAGNOSIS — R059 Cough, unspecified: Secondary | ICD-10-CM

## 2007-06-08 DIAGNOSIS — J329 Chronic sinusitis, unspecified: Secondary | ICD-10-CM

## 2007-06-08 MED ORDER — PROAIR HFA 108 (90 BASE) MCG/ACT IN AERS
INHALATION_SPRAY | RESPIRATORY_TRACT | Status: DC
Start: 2007-06-08 — End: 2008-02-28

## 2007-06-08 MED ORDER — FLONASE 50 MCG/DOSE NA INHA
NASAL | Status: DC
Start: 2007-06-08 — End: 2007-10-18

## 2007-06-08 NOTE — Telephone Encounter (Signed)
Called in refills as ordered.dt,rn

## 2007-06-08 NOTE — Telephone Encounter (Signed)
Staff Message copied by Sharlyne Pacas on Thu Jun 08, 2007 11:10 AM  ------   Message from: Merrilee Jansky, RITA   Created: Thu Jun 08, 2007 11:05 AM   Regarding: REFILL   Contact: [phone]    Loretta Martin is a 46 year old female  Patient's PCP: Arlice Colt, MD    Telephone Information:  Home Phone [phone]  Work Phone [phone]  Mobile [phone]      Patient's language of care: [place]  Patient does not need an interpreter.  Person calling on behalf of patient: patient (self)    Calls today with a for med refill(s).  Proair  flonase  CALL BACK NUMBER:   Will patient remain at this number for the next 60 minutes: Yes  If no, please leave a message, or if you need to speak to a nurse, please call back when you can remain at your phone for 60 minutes.      Is this the patient's Preferred Pharmacy: Yes    [facility] [place]  Phone: [phone] Fax: [phone]

## 2007-10-10 ENCOUNTER — Emergency Department (HOSPITAL_BASED_OUTPATIENT_CLINIC_OR_DEPARTMENT_OTHER)
Admission: RE | Admit: 2007-10-10 | Disposition: A | Discharge status: Home / Self Care | Payer: Self-pay | Source: Emergency Department | Attending: Emergency Medicine | Admitting: Emergency Medicine

## 2007-10-10 ENCOUNTER — Encounter (HOSPITAL_BASED_OUTPATIENT_CLINIC_OR_DEPARTMENT_OTHER): Payer: Self-pay

## 2007-10-10 MED ORDER — VICODIN 5-500 MG PO TABS
ORAL_TABLET | ORAL | Status: DC
Start: 2007-10-10 — End: 2008-02-28

## 2007-10-10 MED ORDER — FLEXERIL 10 MG PO TABS
ORAL_TABLET | ORAL | Status: DC
Start: 2007-10-10 — End: 2009-06-13

## 2007-10-10 NOTE — ED Notes (Signed)
Pt reports chrinoc low back pain for 2 months that has worsened 8/10, denies trauma or fall.  Pt is altering gait r/t pain and now has L ankle pain.  Pt works as Financial trader.  Denies dysuria.  Pt taking tylenol without relief.

## 2007-10-10 NOTE — Discharge Instructions (Signed)
Tensão Muscular/Músculo Estirado  (Muscle Strain (Pulled Muscle))    O diagnóstico do médico é de músculo estirado (rompido). Um músculo estirado, ou tensão muscular, aparece quando um músculo é repuxado e algumas fibras musculares rompem. Isto é muito comum entre atletas quando uma tensão violenta repentinamente aplicada a um músculo força-o além de sua capacidade de reagir. Normalmente, a recuperação de músculo estirado leva de 1 a 2 semanas, mas a cura completa demora 5 a 6 semanas. Uma vez que há milhões de fibras musculares, o corpo geralmente normaliza rapidamente após uma lesão.     INSTRUÇÕES PARA TRATAMENTO DOMICILIAR  · Aplique gelo ao músculo dolorido 15 a 20 minutos a cada hora, no período em que estiver desperto nos 2 primeiros dias. Ponha o gelo em um saco plásticó e coloque uma toalha entre o saco de gelo e a sua pele.   Depois dos 2 primeiros dias, aplique calor ao ferimento, para ajudar a aliviar a dor. Use uma compressa quente por 15 a 20 minutos, 3 a 4 vezes por dia, ou quando necessário. É recomendável voltar a usar gelo no fim do dia ou após atividades. Ajuda a conter inflamação e inchaço.   Não esforce o músculo estirado durante alguns dias, ou se sentir dor.   Para se sentir mais confortável, envolva a área lesionada com uma faixa elástica. Tome cuidado para não apertar forte demais, pois pode afetar a circulação de sangue.   Tome medicamentos como determinado pelo profissional de saúde. Use acetaminofeno (Tylenol®) ou ibuprofeno (Advil® ou Motrin®) quando necessário para aliviar dor. O ibuprofeno também diminuirá a inflamação. Não use aspirina, pois aumentará o sangramento (hematoma) no local da lesão.   Aquecer-se antes de se exercitar ajuda evitar lesões musculares.     TELEFONE SE aumentar a dor ou inchaço na área afetada.      Document Released: 09/20/2005    ExitCare® Patient Information ©2008 [employer].

## 2007-10-11 LAB — EMERGENCY ROOM NOTE

## 2007-10-18 ENCOUNTER — Ambulatory Visit (HOSPITAL_BASED_OUTPATIENT_CLINIC_OR_DEPARTMENT_OTHER): Payer: No Typology Code available for payment source | Admitting: Internal Medicine

## 2007-10-18 VITALS — BP 120/82 | HR 92 | Temp 98.9°F | Wt 137.0 lb

## 2007-10-18 DIAGNOSIS — M549 Dorsalgia, unspecified: Secondary | ICD-10-CM

## 2007-10-18 DIAGNOSIS — J329 Chronic sinusitis, unspecified: Secondary | ICD-10-CM

## 2007-10-18 MED ORDER — FLONASE 50 MCG/DOSE NA INHA
NASAL | Status: AC
Start: 2007-10-18 — End: 2008-02-07

## 2007-10-18 NOTE — Progress Notes (Signed)
HISTORY OF PRESENT ILLNESS: The patient is a 47 year old female who presents complaining of acute on chronic low back pain for at least 4 months. 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 There is no radiation down either leg.  Patient states that whenever she walks in the store her lower back gets heavy and she has to shift her weight to support herself She does carry an extremely heavy purse. No acute trauma.  After eval in the ED she paid 40 dollars for massage and found this very helpful.    She denies any abdominal pain. No nausea, vomiting, diarrhea, or constipation. No abnormal vaginal bleeding or discharge. No pain or burning when she urinates. She denies any loss of control of any bowel or bladder function. There is no numbness, weakness, or tingling of either lower extremity.      She needs refil on sinus medicine    OBJECTIVE:  BP 120/82   Pulse 92   Temp (Src) 98.9 F (37.2 C) (Oral)   Wt 137 lb (62.143 kg)   SpO2 99%   LMP 10/11/2007  Pleasant NAD  Cervical, thoracic and lumbar spine exam is normal without tenderness, masses or kyphoscoliosis. Full range of motion without pain is noted.      724.5E Back Pain  (primary encounter diagnosis)  Comment: musculoskelatal.  Doing well  Plan: REFERRAL TO PHYSICAL THERAPY (INT)    473.9 Unspecified Sinusitis (Chronic)  Comment: The current medical regimen is effective;  continue present plan and medications.  Plan: FLONASE 50 MCG/DOSE NA INHA

## 2007-11-02 ENCOUNTER — Ambulatory Visit (HOSPITAL_BASED_OUTPATIENT_CLINIC_OR_DEPARTMENT_OTHER): Payer: No Typology Code available for payment source

## 2007-11-15 ENCOUNTER — Ambulatory Visit (HOSPITAL_BASED_OUTPATIENT_CLINIC_OR_DEPARTMENT_OTHER): Payer: No Typology Code available for payment source

## 2007-11-15 DIAGNOSIS — M549 Dorsalgia, unspecified: Secondary | ICD-10-CM

## 2007-11-22 ENCOUNTER — Ambulatory Visit (HOSPITAL_BASED_OUTPATIENT_CLINIC_OR_DEPARTMENT_OTHER): Payer: No Typology Code available for payment source

## 2007-11-22 DIAGNOSIS — M549 Dorsalgia, unspecified: Secondary | ICD-10-CM

## 2007-11-23 ENCOUNTER — Ambulatory Visit (HOSPITAL_BASED_OUTPATIENT_CLINIC_OR_DEPARTMENT_OTHER): Payer: No Typology Code available for payment source | Admitting: Nurse Practitioner

## 2007-11-23 VITALS — BP 110/80 | Wt 136.0 lb

## 2007-11-23 DIAGNOSIS — B373 Candidiasis of vulva and vagina: Principal | ICD-10-CM

## 2007-11-23 DIAGNOSIS — Z3009 Encounter for other general counseling and advice on contraception: Secondary | ICD-10-CM

## 2007-11-23 DIAGNOSIS — B3731 Acute candidiasis of vulva and vagina: Secondary | ICD-10-CM

## 2007-11-23 LAB — URINE DIP (POINT OF CARE)
BILIRUBIN, URINE: NEGATIVE (ref 0–0)
GLUCOSE, URINE: NEGATIVE mg/dl (ref 0–0)
KETONE, URINE: NEGATIVE mg/dl (ref 0–0)
LEUKOCYTE ESTERASE: NEGATIVE (ref 0–0)
NITRITE, URINE: NEGATIVE
PH URINE: 5.5 (ref 5.0–8.0)
PROTEIN, URINE: NEGATIVE mg/dl (ref 0–15)
SPECIFIC GRAVITY URINE: 1.015 (ref 1.003–1.030)
UROBILINOGEN URINE: 0.2 mg/dl (ref 0.2–1.0)

## 2007-11-23 MED ORDER — DIFLUCAN 150 MG PO TABS
ORAL_TABLET | ORAL | Status: AC
Start: 2007-11-23 — End: 2007-12-23

## 2007-11-23 MED ORDER — LO/OVRAL (28) 0.3-30 MG-MCG PO TABS
ORAL_TABLET | ORAL | Status: DC
Start: 2007-11-23 — End: 2008-03-12

## 2007-11-23 NOTE — Progress Notes (Signed)
47 year old [id number]    C/o scant white vaginal discharge x 5d-external, no abd pain, no dysuria  Noted after resumed sexual relations with x-partner who is asymptomatic, used condoms x 1 then not-but on OCP's, didn't have adequate lubrication with SA  Denies recent antibiotics  Generally using cotton underwear  Used monistat x 1 day with some improvement a few days ago    Has mammo scheduled 5/09  Negative pap/HPV 2/08    Needs more OCP's, seen by gyn MD last yr for heavy/prolonged menses with ?adenomyosis, rx'ed with LoOvral very successfully, pt now reports light 3day menses with minimal discomfort, no ACHES, no BTB, non-smoker    O: abd - no organo, no mass, non-tender       Pelvic exam: EGBUS within normal limits somewhat reddened and irritated, vaginal discharge described as white and curd-like, normal cervix without lesions, polyps or tenderness, uterus anteverted, uterus normal size, shape, consistency, no mass or tenderness, adnexa normal in size without mass or tenderness.  Wet prep-no trichomonas, no clue, ++hyphae/buds    A: candidiasis r/o STD      Ok to continue on OCP    P: genprobe       Diflucan 150 mg po x 1       Safe sex and lubrication discussed       LoOvral x 6 cylces, case reviewed with Dr. Raylene Miyamoto

## 2007-11-24 LAB — CHLAMYDIA GC NAAT
GENPROBE CHLAMYDIA: NEGATIVE
GENPROBE GC: NEGATIVE

## 2007-11-27 ENCOUNTER — Ambulatory Visit (HOSPITAL_BASED_OUTPATIENT_CLINIC_OR_DEPARTMENT_OTHER): Payer: No Typology Code available for payment source

## 2007-11-27 DIAGNOSIS — M549 Dorsalgia, unspecified: Secondary | ICD-10-CM

## 2007-12-06 ENCOUNTER — Ambulatory Visit (HOSPITAL_BASED_OUTPATIENT_CLINIC_OR_DEPARTMENT_OTHER): Payer: Self-pay

## 2007-12-08 ENCOUNTER — Encounter (HOSPITAL_BASED_OUTPATIENT_CLINIC_OR_DEPARTMENT_OTHER): Payer: Self-pay | Admitting: Internal Medicine

## 2007-12-11 ENCOUNTER — Ambulatory Visit (HOSPITAL_BASED_OUTPATIENT_CLINIC_OR_DEPARTMENT_OTHER): Payer: No Typology Code available for payment source

## 2007-12-13 ENCOUNTER — Ambulatory Visit (HOSPITAL_BASED_OUTPATIENT_CLINIC_OR_DEPARTMENT_OTHER): Payer: No Typology Code available for payment source

## 2007-12-19 ENCOUNTER — Ambulatory Visit (HOSPITAL_BASED_OUTPATIENT_CLINIC_OR_DEPARTMENT_OTHER): Payer: No Typology Code available for payment source

## 2007-12-20 ENCOUNTER — Ambulatory Visit (HOSPITAL_BASED_OUTPATIENT_CLINIC_OR_DEPARTMENT_OTHER): Payer: No Typology Code available for payment source

## 2007-12-25 ENCOUNTER — Ambulatory Visit (HOSPITAL_BASED_OUTPATIENT_CLINIC_OR_DEPARTMENT_OTHER): Payer: No Typology Code available for payment source

## 2008-01-08 ENCOUNTER — Encounter (HOSPITAL_BASED_OUTPATIENT_CLINIC_OR_DEPARTMENT_OTHER): Payer: Self-pay | Admitting: Internal Medicine

## 2008-01-08 ENCOUNTER — Ambulatory Visit (HOSPITAL_BASED_OUTPATIENT_CLINIC_OR_DEPARTMENT_OTHER): Payer: Self-pay | Admitting: Internal Medicine

## 2008-01-08 VITALS — BP 110/74 | HR 96 | Temp 98.3°F | Wt 138.0 lb

## 2008-01-08 DIAGNOSIS — M7710 Lateral epicondylitis, unspecified elbow: Secondary | ICD-10-CM

## 2008-01-08 MED ORDER — NAPROXEN SODIUM 220 MG PO TABS
ORAL_TABLET | ORAL | Status: DC
Start: 2008-01-08 — End: 2008-02-28

## 2008-01-08 NOTE — Progress Notes (Signed)
SUBJECTIVE:  Loretta Martin is a 47 year old female who developed left sided arm pain beginning 4 months ago. Mechanism of injury: none remembered. Immediate symptoms: delayed pain, delayed swelling. Pain is right at lateral epicodyle.  Radiation of pain- None, Parasthesias- None, Was getting PT which was helping, but insurance lapsed so had to stop.  Not using any OTC pain meds.  Symptoms have been gradually worsening. Prior history of related problems: no prior problems with this area in the past.    Patient also concerned about burning at varicose veins    She denies any abdominal pain. No nausea, vomiting, diarrhea, or constipation.     OBJECTIVE:  Vital signs as noted above.  Appearance: in no apparent distress but anxious  Full grip strength.  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

## 2008-01-12 ENCOUNTER — Encounter (HOSPITAL_BASED_OUTPATIENT_CLINIC_OR_DEPARTMENT_OTHER): Payer: Self-pay

## 2008-01-12 NOTE — Progress Notes (Signed)
Enrolled on 4.6.9 with Adrian Prows, LICSW  Non smoker.

## 2008-02-02 ENCOUNTER — Encounter (HOSPITAL_BASED_OUTPATIENT_CLINIC_OR_DEPARTMENT_OTHER): Payer: Self-pay | Admitting: Internal Medicine

## 2008-02-14 ENCOUNTER — Encounter (HOSPITAL_BASED_OUTPATIENT_CLINIC_OR_DEPARTMENT_OTHER): Payer: Self-pay

## 2008-02-14 NOTE — Progress Notes (Signed)
DPH-CCP-CHART REVIEW    Breast Cancer   Clinical Breast Exam result: No Breast Exam documented       Mammography BI-RADS result: Normal or Probably Normal Mammogram (BIRADS 1,2,3)  5/08,due for yearly      Cervical Cancer Screening   PAP Test Result: Normal PAP Test Result  2/08,due for yearly,does not have 3 neg paps documented yearly in a row x3     Cardiovascular Risk Screening   High Risk Finding: None of the Above/Not High Risk                Elevated Risk:  Body Mass Index (BMI) greater than or equal to 25-29  Random bs 144,should have fasting      Prostate Cancer Screening   Digital Rectal Exam (DRE): Does not apply                  Prostate Specific Antigen (PSA): No applicable    Colon Cancer Screening   Colonoscopy results: Does not apply    Referrals    Referral to Risk Reduction/Health Educator for elevated bmi and random bs  Referral to Primary Care Provider due for yearly mammo,pap/breast exam-sees gyn,also random bs 144,? Check fasting.has f/u booked with provider for this month.to patient navigator to follow  Referral to Registered Dietician to be detemined  Added to rre and pn lists and chart forwarded.      RN Case Reviewer: Sharlyne Pacas    Referred to Nurse Manager :  Gertie Baron [facility])

## 2008-02-20 ENCOUNTER — Ambulatory Visit (HOSPITAL_BASED_OUTPATIENT_CLINIC_OR_DEPARTMENT_OTHER): Payer: Self-pay | Admitting: Nurse Practitioner

## 2008-02-20 LAB — MA SCREENING MAMMO BILATERAL WITH CAD

## 2008-02-22 ENCOUNTER — Ambulatory Visit (HOSPITAL_BASED_OUTPATIENT_CLINIC_OR_DEPARTMENT_OTHER): Payer: Self-pay | Admitting: Nurse Practitioner

## 2008-02-22 LAB — TCH BREAST CTR CLINIC NOTE

## 2008-02-28 ENCOUNTER — Ambulatory Visit (HOSPITAL_BASED_OUTPATIENT_CLINIC_OR_DEPARTMENT_OTHER): Payer: No Typology Code available for payment source | Admitting: Internal Medicine

## 2008-02-28 VITALS — BP 98/80 | HR 110 | Temp 98.3°F | Ht 61.5 in | Wt 137.0 lb

## 2008-02-28 DIAGNOSIS — M549 Dorsalgia, unspecified: Secondary | ICD-10-CM

## 2008-02-28 DIAGNOSIS — M7710 Lateral epicondylitis, unspecified elbow: Secondary | ICD-10-CM

## 2008-02-28 DIAGNOSIS — I8390 Asymptomatic varicose veins of unspecified lower extremity: Secondary | ICD-10-CM

## 2008-02-28 DIAGNOSIS — R059 Cough, unspecified: Secondary | ICD-10-CM

## 2008-02-28 DIAGNOSIS — R05 Cough: Secondary | ICD-10-CM

## 2008-02-28 MED ORDER — NAPROXEN SODIUM 220 MG PO TABS
ORAL_TABLET | ORAL | Status: DC
Start: 2008-02-28 — End: 2008-03-12

## 2008-02-28 MED ORDER — VICODIN 5-500 MG PO TABS
ORAL_TABLET | ORAL | Status: DC
Start: 2008-02-28 — End: 2008-03-12

## 2008-02-28 MED ORDER — PROAIR HFA 108 (90 BASE) MCG/ACT IN AERS
INHALATION_SPRAY | RESPIRATORY_TRACT | Status: DC
Start: 2008-02-28 — End: 2009-05-28

## 2008-02-28 NOTE — Progress Notes (Signed)
Due to the language barrier, the office visit was conducted with an interpreter. The interpreter was present during the entire visit.    SUBJECTIVE:   Loretta Martin is a 47 year old female who complains of low back pain.  "The way it is now, cannot stand, cannot walk, cannot put on underwear," this has been the case for 3 days.   When originally missing menses 3 weeks ago, started having back pain but it was mild.  Feeling "out of sorts" all over.  Has missed period, in part, because of missing pills due to loss of insurance.  Patient also stopped PT for back for the same reason.  The pain is positional with bending or lifting, with radiation down the left leg. Mechanism of injury: as above.   Using tylenol without effect.  No weakness or numbness.. Still working as Land.  Elbow pain has resolved, but patient now has very painful varicose veins behing right knee and wants to see specialist for this.  Needs refill on albuterol, but no sinus problems at this time      OBJECTIVE:  Vital signs as noted above. Patient appears to be in mild to moderate pain, no antalgic gait noted. Lumbosacral spine area reveals no local tenderness or mass. Painful and reduced LS ROM noted. Straight leg raise is positive at 130 degrees on left. DTR's, motor strength and sensation normal, including heel and toe gait.  Peripheral pulses are palpable. Lumbar spine X-Ray: not indicated.     786.2 Cough  Comment: refilled PROAIR HFA 108 (90 BASE) MCG/ACT IN AERS         (ALBUTEROL HFA)    724.5E Back Pain  Plan: REFERRAL TO PHYSICAL THERAPY (INT), NAPROXEN         SODIUM 220 MG OR TABS, VICODIN 5-500 MG OR TABS    454.9V Varicose Vein of Leg  Plan: REFERRAL TO VASCULAR SURGERY (INT)    726.32 Lateral Epicondylitis  of Elbow  Comment: resolved

## 2008-02-29 ENCOUNTER — Telehealth (HOSPITAL_BASED_OUTPATIENT_CLINIC_OR_DEPARTMENT_OTHER): Payer: Self-pay | Admitting: Ambulatory Care

## 2008-02-29 NOTE — Telephone Encounter (Signed)
Agree with plan 

## 2008-02-29 NOTE — Telephone Encounter (Signed)
Pt took vicodin today  On an empty stomach and vomited . Got a nose bleed as well . Nose stopped bleeding .   Had taken naproxen this a.m with some relief . Told her to stop vicodin and continue with naproxen . To eat something even with naproxen . If pt continues to vomit call for evaluation tomorrow or go to ed . Pt grees .   Will forward to dr green for review .

## 2008-02-29 NOTE — Telephone Encounter (Signed)
Staff Message copied by Audrie Gallus on Thu Feb 29, 2008 2:51 PM  ------   Message from: Lorenza Chick   Created: Thu Feb 29, 2008 2:33 PM   Regarding: medication problem   Contact: [phone]    Loretta Martin is a 47 year old old female.    Patient's PCP: Arlice Colt, MD    In case we get disconnected what is the best number to reach you at today:  Mobile Phone Telephone Information:  Mobile [phone]      Person calling:  Patient (self)    How can I help you today:   Other she said the medication she got yesterday for her pain is making her sick. She said she throws up after she takes it. She said she had to stop to her today to trhow up.    Patient's language of care: [place]    Would you like an interpreter when the nurse calls you back?  NO

## 2008-03-06 ENCOUNTER — Ambulatory Visit (HOSPITAL_BASED_OUTPATIENT_CLINIC_OR_DEPARTMENT_OTHER): Payer: No Typology Code available for payment source

## 2008-03-07 ENCOUNTER — Encounter: Payer: Self-pay | Admitting: Internal Medicine

## 2008-03-07 DIAGNOSIS — M549 Dorsalgia, unspecified: Secondary | ICD-10-CM

## 2008-03-07 DIAGNOSIS — M545 Low back pain: Secondary | ICD-10-CM

## 2008-03-07 DIAGNOSIS — G8929 Other chronic pain: Secondary | ICD-10-CM | POA: Insufficient documentation

## 2008-03-07 NOTE — Progress Notes (Unsigned)
agree with plan of care

## 2008-03-07 NOTE — Progress Notes (Unsigned)
Physical Therapy Plan of Care    LO:VFIEP Chilton Si, MD  Referring Provider: Arlice Colt  Diagnosis: 724.5E Back Pain  (primary encounter diagnosis)    Assessment/Objective Findings: Patient is a 47 year old year-old female who reports severe LBP and currently presents with pain in her bilateral Lumbar spine. Lumbar AROM grossly % with pain at end range. Lumbar AROM grossly 70% with pain at end range of SBR and extension. March test:-ve, SI compression:+ve B, Posterior Pelvic Pain Provocation test:+ve L ,traction: +ve Provocation pubic symphesis:-ve , Slump test:+ve L, Active SLR:-ve, , Modified Tendelenburg: +ve R, SLR +ve for radicular sx L with DF.  Pt presents with SIJ and lumbar dysfunction. Poor core stability.       Pain, Decreased ROM, Decreased Strength, Decreased Functional Mobility, Decreased Joint Mobility and Decreased Tolerance of ADLs  Patient will benefit from skilled PT to address the rehabilitation goals outlined below.    Rehabilitation Goals  Patient to be Independent with Home Exercise Program.  Duration: 4 weeks  Average low back pain will be reduced from a 7/10 to a 4/10. Duration: 4 weeks    Pt. will demonstrate increased L-spine ROM by 25% throughout.  Duration: 4 weeks  Pt. will demonstrate increased postural awareness with ability to self correct  without cues.   Duration: 4 weeks  Pt. will demonstrate good core stability.   Duration: 4 weeks  Pt. will report ability to perform all work duties with minimal pain.   Duration: 4 weeks    Long Term Goal: Pt will be able to demonstrate good lifting mechanics with 40# floor to waist without limitation or pain.    Treatment Plan:   ** Stretching/ROM Exercise  ** Therapeutic Exercise  ** Home Exercise Program  ** Joint Mobilization  ** Soft Tissue Mobilization  ** Electrical Stimulation/TENS  ** Hot/Cold Rx  ** Manual Traction  ** Functional Activities  ** Patient Education      Recommend Physical Therapy be continued 2 times per week for 6  weeks.  The rehabiliation potential for this patient is good    Patient is agreeable to treatment plan and is aware of attendance policy.    Carman Ching PT

## 2008-03-08 ENCOUNTER — Ambulatory Visit (HOSPITAL_BASED_OUTPATIENT_CLINIC_OR_DEPARTMENT_OTHER): Payer: No Typology Code available for payment source

## 2008-03-12 ENCOUNTER — Ambulatory Visit (HOSPITAL_BASED_OUTPATIENT_CLINIC_OR_DEPARTMENT_OTHER): Payer: No Typology Code available for payment source | Admitting: Nurse Practitioner

## 2008-03-12 ENCOUNTER — Encounter (HOSPITAL_BASED_OUTPATIENT_CLINIC_OR_DEPARTMENT_OTHER): Payer: Self-pay | Admitting: Nurse Practitioner

## 2008-03-12 VITALS — BP 100/70 | Wt 139.0 lb

## 2008-03-12 DIAGNOSIS — N920 Excessive and frequent menstruation with regular cycle: Secondary | ICD-10-CM

## 2008-03-12 MED ORDER — LO/OVRAL (28) 0.3-30 MG-MCG PO TABS
ORAL_TABLET | ORAL | Status: DC
Start: 2008-03-12 — End: 2008-05-02

## 2008-03-12 NOTE — Progress Notes (Signed)
47 year old G3P0003    Presents for recurrence of menorrhagia  LMP 03/05/2008, heavy with clots, still with scant dark bleeding today  PMP mid April x 7d, heavy with clots    W/u and rx'ed successfully in past with LoOvral by Dr. Raylene Miyamoto, ran out of OCP's in March due to insurance problem, menses April and June very heavy x 7d, clots and dysmenorrhea, no menses in May, no intermenstrual bleeding  Separated from husband again, last SA 3/09  On OCP's no ACHES, non-smoker, has BTL    Pap/HPV negative 2/08  Routine f/u and mammogram at [facility] s/p stereotactic biopsy 5/09    No changes in medical, surgical, family or social history    O: abd - abdominoplasty scars, no organo, no mass, non-tender       Pelvic exam: normal vagina and vulva, EGBUS within normal limits, normal cervix without lesions, polyps or tenderness, multiparous os with scant dark bleeding per os, adnexa normal in size without mass or tenderness.    A: menorrhagia without OCP's    P: genprobe       LoOvral x 6 cycles to resume today       RTC 6 month check and PRN, case discussed with Dr. Baldomero Lamy

## 2008-03-13 ENCOUNTER — Ambulatory Visit (HOSPITAL_BASED_OUTPATIENT_CLINIC_OR_DEPARTMENT_OTHER): Payer: No Typology Code available for payment source

## 2008-03-13 DIAGNOSIS — M549 Dorsalgia, unspecified: Secondary | ICD-10-CM

## 2008-03-13 LAB — CHLAMYDIA GC NAAT
GENPROBE CHLAMYDIA: NEGATIVE
GENPROBE GC: NEGATIVE

## 2008-03-19 ENCOUNTER — Ambulatory Visit (HOSPITAL_BASED_OUTPATIENT_CLINIC_OR_DEPARTMENT_OTHER): Payer: No Typology Code available for payment source | Admitting: Surgery

## 2008-03-19 ENCOUNTER — Ambulatory Visit (HOSPITAL_BASED_OUTPATIENT_CLINIC_OR_DEPARTMENT_OTHER): Payer: No Typology Code available for payment source

## 2008-03-19 DIAGNOSIS — M549 Dorsalgia, unspecified: Secondary | ICD-10-CM

## 2008-03-22 ENCOUNTER — Ambulatory Visit (HOSPITAL_BASED_OUTPATIENT_CLINIC_OR_DEPARTMENT_OTHER): Payer: No Typology Code available for payment source

## 2008-03-22 DIAGNOSIS — M549 Dorsalgia, unspecified: Secondary | ICD-10-CM

## 2008-03-25 ENCOUNTER — Ambulatory Visit (HOSPITAL_BASED_OUTPATIENT_CLINIC_OR_DEPARTMENT_OTHER): Payer: No Typology Code available for payment source

## 2008-03-25 ENCOUNTER — Telehealth (HOSPITAL_BASED_OUTPATIENT_CLINIC_OR_DEPARTMENT_OTHER): Payer: Self-pay | Admitting: Ambulatory Care

## 2008-03-25 DIAGNOSIS — M549 Dorsalgia, unspecified: Secondary | ICD-10-CM

## 2008-03-25 NOTE — Telephone Encounter (Signed)
Spoke with pt. C/o foot pain since Saturday. No injury, no trauma. Foot became swollen yesterday. No calf pain or swelling, skin is not discolored. Not taking vicodin which she has for back pain. Advised use Naproxen regularly. Given appt Wed 6/24 with pcp, advised call if improved. Advised rest, ice, elevate. Does work as Land, goes up/down stairs a lot. Call if changed or worsening prior to appt.

## 2008-03-25 NOTE — Telephone Encounter (Signed)
Staff Message copied by Windell Moulding on Mon Mar 25, 2008 2:20 PM  ------   Message from: Riccardo Dubin   Created: Mon Mar 25, 2008 2:16 PM   Regarding: Swollen and pain on the left foot   Contact: [phone]    Loretta Martin is a 47 year old old female.    Patient's PCP: Arlice Colt, MD    In case we get disconnected what is the best number to reach you at today:  Home Phone [phone] (home)    Person calling:  Patient (self)    How can I help you today:   Patient said that her left foot started hurting on Saturday. Sunday morning it was also swollen. She is not able to walk well. She would like to see Dr. Chilton Si    Patient's language of care: [place]    Would you like an interpreter when the nurse calls you back?  NO

## 2008-03-27 ENCOUNTER — Ambulatory Visit (HOSPITAL_BASED_OUTPATIENT_CLINIC_OR_DEPARTMENT_OTHER): Payer: No Typology Code available for payment source | Admitting: Internal Medicine

## 2008-03-27 VITALS — BP 118/70 | HR 74 | Temp 98.0°F | Ht 62.21 in | Wt 139.0 lb

## 2008-03-27 DIAGNOSIS — M722 Plantar fascial fibromatosis: Secondary | ICD-10-CM

## 2008-03-27 DIAGNOSIS — F329 Major depressive disorder, single episode, unspecified: Secondary | ICD-10-CM

## 2008-03-27 DIAGNOSIS — M549 Dorsalgia, unspecified: Secondary | ICD-10-CM

## 2008-03-27 DIAGNOSIS — F3289 Other specified depressive episodes: Secondary | ICD-10-CM

## 2008-03-27 NOTE — Progress Notes (Signed)
Due to the language barrier, the office visit was conducted with an interpreter. The interpreter was present during the entire visit.    Loretta Martin is a 47 year old female who states as I walk in the door "the foot pain is getting in the way of my walking and I want an x ray."    Patient with chronic back pain who is getting PT which is "not helping."  States on Saturday she woke up and stepped on right foot and had severe pain like an electrical shock at the heel.  Remained painfull throughout the day.  On  sunday there was swelling at foot, and pain remained.  Getting PT for back on Monday, but was told that they could not help with foot wihtout a new referral.  Patient is very concerned these problems are connected, and that this will prevent her from working.  No pain in leg, no cramping.  Ne remebered trauma.  No fevers or chills.  No numbness no weakness.    Needs to wear sneakers because otherwise cannot put heel on the floor.  Restarted pain medicnes I gave her (naproxen?) and using dorflex from [place].  These help a little.    OBJECTIVE:  BP 118/70   Pulse 74   Temp (Src) 98 F (36.7 C) (Oral)   Ht 5' 2.21" (1.58 m)   Wt 139 lb (63.05 kg)   SpO2 96%   LMP 03/05/2008  NAD, but patient clearly worried.    Cervical, thoracic and lumbar spine exam is normal without tenderness, masses or kyphoscoliosis.  The lower extremities are normal and reveal no sign of DVT. Calves and thighs are soft and non tender, color is normal, no swelling or redness. Homan's sign is negative.  Pedal pulses are normal.  Very tender at plantar surface at insertion of plantar fascia.  No ecchymosis edema or erythema.    728.29F Plantar Fasciitis  (primary encounter diagnosis)  Comment: explained cause, natural hx and treatments including use, cushioned sole and NSAIDS.  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  She declines tx for this, but requires a great deal of reassurance.  I showed her pictures from an anatomy text book to explian disease and reassure that this is noty an indication of an isidious process

## 2008-03-29 ENCOUNTER — Ambulatory Visit (HOSPITAL_BASED_OUTPATIENT_CLINIC_OR_DEPARTMENT_OTHER): Payer: No Typology Code available for payment source

## 2008-04-01 ENCOUNTER — Ambulatory Visit (HOSPITAL_BASED_OUTPATIENT_CLINIC_OR_DEPARTMENT_OTHER): Payer: No Typology Code available for payment source

## 2008-04-01 ENCOUNTER — Encounter (HOSPITAL_BASED_OUTPATIENT_CLINIC_OR_DEPARTMENT_OTHER): Payer: Self-pay | Admitting: Internal Medicine

## 2008-04-04 ENCOUNTER — Ambulatory Visit (HOSPITAL_BASED_OUTPATIENT_CLINIC_OR_DEPARTMENT_OTHER): Payer: No Typology Code available for payment source

## 2008-05-01 ENCOUNTER — Ambulatory Visit (HOSPITAL_BASED_OUTPATIENT_CLINIC_OR_DEPARTMENT_OTHER): Payer: Self-pay | Admitting: Surgery

## 2008-05-02 ENCOUNTER — Other Ambulatory Visit (HOSPITAL_BASED_OUTPATIENT_CLINIC_OR_DEPARTMENT_OTHER): Payer: Self-pay | Admitting: Ambulatory Care

## 2008-05-02 DIAGNOSIS — N920 Excessive and frequent menstruation with regular cycle: Secondary | ICD-10-CM

## 2008-05-02 NOTE — Telephone Encounter (Signed)
With the [employer] on phone, T/C to pt,     LMP 04/30/08   Denies aches  Needs 05/05/08  Encounter to E. Alvester Morin NP

## 2008-05-02 NOTE — Telephone Encounter (Signed)
Per office visit with E. Neiman CNM pt to have "6 month cycle of ocp" only 2 month supply ordered.  Will forward to provider for clarification.      Encounter to E. Alvester Morin NP

## 2008-05-02 NOTE — Telephone Encounter (Signed)
Staff Message copied by Claudell Kyle on Thu May 02, 2008 1:58 PM  ------   Message from: Tresa Garter   Created: Thu May 02, 2008 1:38 PM   Regarding: refill    Loretta Martin [id number], 47 year old, female, Telephone Information:  Home Phone [phone]  Work Phone [phone]  Mobile [phone]      CALL BACK NUMBER: [phone]  Cell phone:   Other phone:    Available times:    Patient's language of care: [place]    Patient needs a [place] interpreter.    Patient's PCP: Arlice Colt, MD    Person calling on behalf of patient: Patient (self)    Calls today for med refill(s). OCPs To start this Sunday 05/05/08. Pt of Hartley Barefoot. Lo/ovra-28.

## 2008-05-02 NOTE — Telephone Encounter (Signed)
Due to the language barrier, the phone call was conducted in Portuguese with an interpreter. The interpreter's name is Dirce. The interpreter was on the phone during the entire phone call.    TC to pt, LM on answering machine for pt to return call to clinic today.

## 2008-05-02 NOTE — Telephone Encounter (Signed)
Staff Message copied by Otilio Miu on Thu May 02, 2008 3:21 PM  ------   Message from: Gershon Crane   Created: Thu May 02, 2008 3:01 PM    Loretta Martin [id number], 47 year old, female, Telephone Information:  Home Phone [phone]  Work Phone [phone]  Mobile [phone]      CALL BACK NUMBER: [phone]  Cell phone:   Other phone:    Available times:    Patient's language of care: [place]    Patient does not need an interpreter.    Patient's PCP: Arlice Colt, MD    Person calling on behalf of patient: Patient (self)    Calls today returning phone call.

## 2008-05-03 ENCOUNTER — Telehealth (HOSPITAL_BASED_OUTPATIENT_CLINIC_OR_DEPARTMENT_OTHER): Payer: Self-pay

## 2008-05-03 MED ORDER — LO/OVRAL (28) 0.3-30 MG-MCG PO TABS
ORAL_TABLET | ORAL | Status: DC
Start: 2008-05-02 — End: 2008-07-24

## 2008-05-03 NOTE — Telephone Encounter (Signed)
Pills ordered at visit on 03/12/08 and were evidently never picked up - re-ordered for patient. Jannet Askew, APRN.

## 2008-05-03 NOTE — Telephone Encounter (Signed)
Staff Message copied by Ardeen Fillers on Fri May 03, 2008 9:41 AM  ------   Message from: Encarnacion Chu   Created: Fri May 03, 2008 9:35 AM   Regarding: Rx   Contact: [phone]     Francely Craw [id number], 47 year old, female, Telephone Information:  Home Phone [phone]  Work Phone [phone]  Mobile [phone]      CALL BACK NUMBER: [phone]  Cell phone:   Other phone:    Available times:    Patient's language of care: [place]    Patient needs a [place] interpreter.    Patient's PCP: Arlice Colt, MD    Person calling on behalf of patient: Patient (self)    Calls today for med refill(s). Pt spoke with triage and was told refill was called in. She went to pharmacy this morning and they havent received any Rx request.    OCPs To start this Sunday 05/05/08. Pt of Hartley Barefoot. Lo/ovra-28.

## 2008-05-03 NOTE — Telephone Encounter (Signed)
TC pt via Dirce portuguese interp    Pt notified that her Rx for Lo/Ovral has been called into her pharmacy [facility] @ [phone]

## 2008-05-03 NOTE — Telephone Encounter (Signed)
Ray at [facility] called to confirm Rx ordered earlier today for pt by [facility] is Lo/Ovral.  Advised Ray that Lo/Ovral is correct and that is the rx that was ordered.  Also confirmed Rx by spelling it to Ray.

## 2008-05-03 NOTE — Telephone Encounter (Signed)
Staff Message copied by Ardeen Fillers on Fri May 03, 2008 12:21 PM  ------   Message from: Almira Coaster   Created: Fri May 03, 2008 11:45 AM   Regarding: Regarding pt's med    Loretta Martin [id number], 47 year old, female, Telephone Information:  Home Phone [phone]  Work Phone [phone]  Mobile [phone]      Loretta Martin NUMBER: [phone]  Cell phone:   Other phone:    Available times:    Patient's language of care: [place]    Patient does not need an interpreter.    Patient's PCP: Arlice Colt, MD    Person calling on behalf of patient: Ray from [facility] pharmacy [phone]    Calls today with questions and concerns. Regarding pt's med

## 2008-05-03 NOTE — Telephone Encounter (Signed)
TC pt via Tai portuguese interp at ext [phone]    Pt states when she called her pharmacy to see if her Rx for lo/ovral was ready she was told it had not been called in yet.      Advised pt that RN will forward to triage provider to review/approve Rx and will call pt back as soon as it has been approved/or not.  Needs refill by 05/05/08    Forwarded to K.Conrad Burlington

## 2008-05-07 ENCOUNTER — Ambulatory Visit (HOSPITAL_BASED_OUTPATIENT_CLINIC_OR_DEPARTMENT_OTHER): Payer: No Typology Code available for payment source | Admitting: Surgery

## 2008-06-03 LAB — SURG SPEC CLINIC NOTE

## 2008-07-20 ENCOUNTER — Ambulatory Visit: Payer: No Typology Code available for payment source | Admitting: Ambulatory Care

## 2008-07-20 DIAGNOSIS — Z23 Encounter for immunization: Secondary | ICD-10-CM

## 2008-07-20 NOTE — Progress Notes (Signed)
Influenza Vaccine Procedure  July 20, 2008    1. Has the patient received the information for the influenza vaccine? Yes    2. Does the patient have any of the following contraindications?  Allergy to eggs? No  Allergic reaction to previous influenza vaccines? No  Any other problems to previous influenza vaccines? No  Paralyzed by Guillain-Barre syndrome?  No  Current moderate or severe illness? No  Allergy to contact lens solution? No    3. The vaccine has been administered in the usual fashion and the patient/guardian was instructed to wait 20 minutes before leaving the building in the event of an allergic reaction:     Immunization information reviewed. Current VIS reviewed and given to patient/ guardian. Verbal assent obtained from patient/ guardian. Comfort measures for possible side effects reviewed.

## 2008-07-24 ENCOUNTER — Encounter (HOSPITAL_BASED_OUTPATIENT_CLINIC_OR_DEPARTMENT_OTHER): Payer: Self-pay | Admitting: Nurse Practitioner

## 2008-07-24 ENCOUNTER — Ambulatory Visit (HOSPITAL_BASED_OUTPATIENT_CLINIC_OR_DEPARTMENT_OTHER): Payer: No Typology Code available for payment source | Admitting: Nurse Practitioner

## 2008-07-24 VITALS — BP 100/60 | Wt 142.5 lb

## 2008-07-24 DIAGNOSIS — N920 Excessive and frequent menstruation with regular cycle: Secondary | ICD-10-CM

## 2008-07-24 MED ORDER — LO/OVRAL (28) 0.3-30 MG-MCG PO TABS
ORAL_TABLET | ORAL | Status: DC
Start: 2008-07-24 — End: 2008-07-24

## 2008-07-24 MED ORDER — MIRCETTE 0.15-0.02/0.01 MG (21/5) PO TABS
ORAL_TABLET | ORAL | Status: DC
Start: 2008-07-24 — End: 2008-07-24

## 2008-07-24 MED ORDER — MIRCETTE 0.15-0.02/0.01 MG (21/5) PO TABS
ORAL_TABLET | ORAL | Status: DC
Start: 2008-07-24 — End: 2008-10-30

## 2008-07-24 NOTE — Progress Notes (Signed)
47 year old G3P0003    C/o ha, bloating on current OCP - new generic of LoOvral  On LoOvral since 10/2005 for control of menorrhagia due to possible adenomyosis, good relief and happy on LoOvral and previous generics, currently on crystell 28 generic of LoOvral x 4 months and doesn't like it (weight gain, bloating, ha's relieved by tylenol), wants to resume LoOvral  Intermittent SA - BTL  Non-smoker    Mammogrqam negative 5/09  Pap/HPV negative 2/08    O: deferred    A  Perimenopausal age woman becoming symptomatic on LoOvral generic    P: trial lower dose OCP to see if decreased symptoms and control of menorrhagia, pt agrees       Mircette x 4 cycles       RTC annual gyn exam and re-eval of new OCP's, PRN sooner

## 2008-07-24 NOTE — Patient Instructions (Signed)
Annual gyn with [name] in 3 months

## 2008-10-30 ENCOUNTER — Encounter (HOSPITAL_BASED_OUTPATIENT_CLINIC_OR_DEPARTMENT_OTHER): Payer: Self-pay | Admitting: Nurse Practitioner

## 2008-10-30 ENCOUNTER — Ambulatory Visit (HOSPITAL_BASED_OUTPATIENT_CLINIC_OR_DEPARTMENT_OTHER): Payer: No Typology Code available for payment source | Admitting: Nurse Practitioner

## 2008-10-30 VITALS — BP 120/80 | Wt 144.0 lb

## 2008-10-30 DIAGNOSIS — Z01419 Encounter for gynecological examination (general) (routine) without abnormal findings: Secondary | ICD-10-CM

## 2008-10-30 DIAGNOSIS — Z1159 Encounter for screening for other viral diseases: Secondary | ICD-10-CM

## 2008-10-30 DIAGNOSIS — Z3009 Encounter for other general counseling and advice on contraception: Secondary | ICD-10-CM

## 2008-10-30 MED ORDER — MIRCETTE 0.15-0.02/0.01 MG (21/5) PO TABS
ORAL_TABLET | ORAL | Status: DC
Start: 2008-10-30 — End: 2009-05-13

## 2008-10-30 NOTE — Progress Notes (Signed)
48 year old G3P0103 here for annual gyn exam. She has seen her primary care provider in the last year.     Obstetric History    G3  P3  T0  P1  TAB0  SAB0  E0  M0  L3     Comment: C/S X 2 followed by VBAC       CC/HPI: routine screening  -currently on Mircette to contol menorrhagia/dysmenorrhe from possible adenomyosis, good results menses x 3d without pain, no BTB, non-smoker, wants to continue OCP's  -last SA 3 months ago, currently separated from husband  -hx benign stereotactic biopsy/calcifications, due for f/u mammo and appt in breast center-scheduled 02/21/2009  -pap/HPV negative 11/2006    ROS - notes occ nausea unrelated to food of OCP's, notes daily diarrhea and upset stomach after eating, denies pain or bleeding, has been seen by PCP in past for same and told to increase fiber, notes problem is worsening and now a daily occurrence      Current outpatient prescriptions:  PROAIR HFA 108 (90 BASE) MCG/ACT IN AERS (ALBUTEROL HFA) 2 puffs as needed for cough Disp: 1 Rfl: 5   MIRCETTE 0.15-0.02/0.01 MG (21/5) OR TABS one daily Disp: 1 Rfl: 5       ALLERGIES:  Pcn, Acetaminophen W/ Hydrocodone      LMP: Patient's last menstrual period was 10/13/2008.       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 - had negative CXR pp at [facility] referred to TB clinic    Nonspecific Abnormal Results of Thyroid Function Study 08/31/2004    Comment: increased TSH 4.19 6/00; TSH nl 9/00; 05/04/99 - wnl    Urinary Tract Infection, Site not Specified 08/31/2004    Comment: 4/02, 6/02    Depressive Disorder, not Elsewhere Classified 08/31/2004    Comment: 10/00 to Psych on Paxil, self d/c'd; 9/00 head CT:  scattered punctate calcifications    Asthma            Past Surgical History    LIG/TRNSXJ FLP TUBE ABDL/VAG APPR UNI/BI 1998    Comment: Tubal Ligation    REDUCTION MAMMAPLASTY 2004    Comment: [place]    OB ANTEPARTUM CARE C DLVR&POSTPARTUM     Comment: c/section x 2       Occupational History  housecleaning       Social History Main Topics   Tobacco Use: Quit     Quit date:  08/10/2004    Comment: smoked only for 6 months.    Alcohol Use: Yes    Comment: sporadically    Drug Use: No    Sexually Active: Not Currently  Partner(s): Female    Birth Control/ Protection: Surgical      Comment: men age 9 q mont, menorrhagia 2006, past history of syphillis and rx , 1st SA age 66, 1 lietime partners, separated again from her husband.       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, he helps with financial support of youngest child                    Family History    Diabetes Mother         PHYSICAL EXAM:  No thyromegaly  Breasts: no masses, skin, nipple or axillary changes, nipples everted and scars  Abdomen: no masses or tenderness, no palpable hepatosplenomegaly, soft, non-tender and abdominoplasy scar    PELVIC:  External Genitalia: normal architecture  Vagina: well rugated, well estrogenized and no lesions  Vaginal Discharge: normal appearing  Cervix: no lesions and multiparous appearance  Uterus: anteverted, normal size, smooth contour, mobile and non-tender  Adnexa: not palpable and no masses, nodularity, tenderness  Rectum: normal and minimal hemorrhoids ?fissure at 12 o'clock      ASSESSMENT:  Normal GYN annual exam  No contraind to OCP's  Needs f/u for GI issues of diarhea and nausea with PCP ?GI referral    PLAN:  Genprobe done  Mircette x 6 cycled e-prescribed  F/u with breast clinic as planned  Agrees to call PCP for GI f/u-chart cc'ed to PCP  Aware of  recommendation for repeat pap/HPV 11/2009

## 2008-10-31 ENCOUNTER — Ambulatory Visit (HOSPITAL_BASED_OUTPATIENT_CLINIC_OR_DEPARTMENT_OTHER): Payer: No Typology Code available for payment source | Admitting: Internal Medicine

## 2008-10-31 ENCOUNTER — Telehealth (HOSPITAL_BASED_OUTPATIENT_CLINIC_OR_DEPARTMENT_OTHER): Payer: Self-pay

## 2008-10-31 VITALS — BP 104/70 | HR 106 | Temp 98.9°F | Wt 143.0 lb

## 2008-10-31 DIAGNOSIS — K529 Noninfective gastroenteritis and colitis, unspecified: Secondary | ICD-10-CM

## 2008-10-31 LAB — CHLAMYDIA GC NAAT
GENPROBE CHLAMYDIA: NEGATIVE
GENPROBE GC: NEGATIVE

## 2008-10-31 NOTE — Telephone Encounter (Signed)
Staff Message copied by Ledell Peoples on Thu Oct 31, 2008 11:37 AM  ------   Message from: Riccardo Dubin   Created: Thu Oct 31, 2008 11:31 AM   Regarding: Returning phone call   Contact: [phone]    Alahia Whicker is a 48 year old old female.    Patient's PCP: Arlice Colt, MD    In case we get disconnected what is the best number to reach you at today:  Home Phone [phone] (home)    Person calling:  Patient (self)    How can I help you today:   Patient is returning nurses call    Patient's language of care: [place]    Would you like an interpreter when the nurse calls you back?  No

## 2008-10-31 NOTE — Telephone Encounter (Signed)
Patient returning call-reports that she has had this problem for some time but is now worse.She reports feeling nauseous and having episode of diarrhea. She reports as soon as she eats the food "goes right through her". She denies fever, urinary symptoms, dizziness, lightheadedness, fainting, back pain, flank pain,  vomiting, difficulty breathing. She denies black or bloody stool. She reports she feels her abdomen is bloated. She requests to be evaluated today. Appointment scheduled for today with Dr.Balaban for evaluation. Agrees with plan.    Reviewed stomach upset/nausea/vomiting/esophageal reflux home care instructions with pt as below:  -do not eat/drink anything for 1 hr after last emesis.  -drink sips of clear fluids for first 12 hrs, including gelatin, water, sports drinks, flat soda, clear broth, flavored ice, do not drink citrus juice.  -increase fluids as tolerated.  -after 12 hrs, try small amounts bland foods, such as rice, potatoes, soda crackers, pretzels, dry toast, and applesauce.  -after bland food is tolerated, resume normal diet as tolerated.  -retake medications if vomiting occurs within 30 minutes of taking usual medication.  -avoid milk, citrus foods & juices, and spicy or fatty foods.  -take otc dramamine for nausea if oked by MD.  -if from virus, viruses causing nausea and vomiting are easily spread.  Pay special attention to handwashing.  Avoid using towels, tableware, and cups used by infected person.  -take medication a/o by MD for Esophageal reflux.  -decrease/stop smoking.  -try to decrease stress utilizing relaxation technique.  Report to [facility]:  Fever, weakness, or abdominal pain for >2hrs.  -no improvement in 48 hrs or condition worsens.  -signs of dehydration.  Seek emergent care if:  -Decreased LOC.  -vomiting blood or coffee-grounds like emesis.  Pt agrees to try above recommendations.  Pt instructed to call MD with any worsening of their signs & symptoms or go to ER to be examined  if necessary.  Pt also instructed/encouraged to call with any further questions or concerns.      Reviewed with patient home care instructions per [employer] Third Edition, p167.  - Clear liquid diet for the first 12-24 hours ( sips of water, flat soda, clear     broth, gelatin-not red).  - During the next 12 hours, may progress to eating soup (avoid cream         soups or chowders), dry toast, soda crackers, white rice, pretzels,     bananas, applesauce, and potatoes.  - May progress to a regular diet after soft formed stools occur.  - Avoid dairy products, citrus juices, raw fruits and vegetables, and spicy     or fries foods for at least 2-5 days after diarrhea has subsided.  - After 6 hours of diarrhea and cramping, or if pain persists, may take     OTC antidiarrheal medication according to manufacturers directions.   - Acetaminophen may be used for fever - per  manufacturers directions.      Report the Following to [facility]:  - No improvement or diarrhea worsens after 48 hours of home care     measures  -Yellow, frothy, bloody, or greens stools occurring more than one time  - Signs of dehydration  -Fever, weakness, or lethargy  -Persistent vomiting  -Person with insulin -dependent diabetes changes diet plan    Seek Emergency Care if:    - Person has diabetes and rapid or labored breathing  - Severe abdominal pain, swelling, or fever

## 2008-10-31 NOTE — Telephone Encounter (Signed)
Staff Message copied by Ledell Peoples on Thu Oct 31, 2008 10:53 AM  ------   Message from: Riccardo Dubin   Created: Thu Oct 31, 2008 10:35 AM   Regarding: Diarrhea, nausea   Contact: [phone]    [name] is a 48 year old old female.    Patient's PCP: Arlice Colt, MD    In case we get disconnected what is the best number to reach you at today:  Home Phone [phone] (home)    Person calling:  Patient (self)    How can I help you today:   Patient said that she is having diarrhea and nausea. Wants to see the doctor.    Patient's language of care: [place]    Would you like an interpreter when the nurse calls you back?  NO

## 2008-10-31 NOTE — Telephone Encounter (Signed)
Call to patient - message left on voice mail to call clinic and speak with nurses. Tesneem Dufrane, RN

## 2008-10-31 NOTE — Progress Notes (Signed)
SUBJECTIVE:  Loretta Martin is a 48 year old female with the following Problems and Medications    Patient Active Problem List:     TUBERCULIN TEST REACTION NO TBC [795.5]     DEPRESSIVE DISORDER NEC [311]     MAMMOGRAPHIC MICROCALCIFICATION [793.81]     COUGH [786.2]     IBS (Irritable Bowel Syndrome) [564.1U]     DPH-CARE COORDINATION PROGRAM PARTICIPANT [[id number]]     Back Pain [724.5E]        Current outpatient prescriptions:  MIRCETTE 0.15-0.02/0.01 MG (21/5) OR TABS one daily Disp: 1 Rfl: 5   PROAIR HFA 108 (90 BASE) MCG/ACT IN AERS (ALBUTEROL HFA) 2 puffs as needed for cough Disp: 1 Rfl: 5       DIARHEA  - began approx one year ago; intermittently;   - initially depended upon what she was eating (eg coffee from [employer]).  - over past two months, occuring more frequently, and with most foods  - more recently, she has had some urgency - especially after meals, has a cramping sensation followed by need to defecate; occ accompanied by nausea  - her belly feels swollen and bloated.  - stool is liquid  - in the past two months, she has diarrhea with every stool - usually two times daily (after eating - she eats twice per day);    - lives with her three children at home; none of them are having GI problems.  - last time out of the country was in 2004 to [place].  - no other OTC meds or vitamins  - light ETOH use (1-2 drinks twice per week)  - 1 cup coffee daily; does have daily dairy products including cheese    No fevers or chills.  Occ night sweats - occ once per week.  Denies palpitations  No episodes of constipation    Social History   Marital Status: Divorced  Spouse Name: N/A    Years of Education: N/A  Number of Children: 3     Occupational History  housecleaning       Social History Main Topics   Tobacco Use: Quit     Quit date:  08/10/2004    Comment: smoked only for 6 months.    Alcohol Use: Yes    Comment: sporadically    Drug Use: No    Sexually Active: Not Currently  Partner(s): Female    Birth Control/  Protection: Surgical      Comment: men age 57 q mont, menorrhagia 2006, past history of syphillis and rx , 1st SA age 18, 1 lietime partners, separated again from her husband.       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, he helps with financial support of youngest child     OBJECTIVE:  BP 104/70   Pulse 106   Temp (Src) 98.9 F (37.2 C) (Oral)   Wt 143 lb (64.864 kg)   SpO2 96%   LMP 10/13/2008  Pain Score: 8 (8/10)  Appearance: NAD  HEENT: NC/AT MMM Throat-clear  NECK: Supple; no LAD; no thryoidmegaly  LUNGS: CTA  CV: RRR s1s2 no m/r/g  ABD: soft NT/ND no bs x 20 seconds  RECTAL: deferred  EXTR: no cyanosis/clubbing/edema  NEURO: alert and oriented x 3; nl gait    ASSESSMENT:PLAN:  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 Chilton Si.

## 2008-11-01 LAB — COMPREHENSIVE METABOLIC PANEL
ALANINE AMINOTRANSFERASE: 13 IU/L (ref 7–35)
ALBUMIN: 4.1 g/dl (ref 3.4–4.8)
ALKALINE PHOSPHATASE: 43 IU/L (ref 25–106)
ANION GAP: 4 mmol/L (ref 2–25)
ASPARTATE AMINOTRANSFERASE: 24 IU/L (ref 8–34)
BILIRUBIN TOTAL: 0.8 mg/dl (ref 0.2–1.1)
BUN (UREA NITROGEN): 9 mg/dl (ref 6–20)
CALCIUM: 9.5 mg/dl (ref 8.6–10.3)
CARBON DIOXIDE: 30 mmol/L (ref 22–32)
CHLORIDE: 104 mmol/L (ref 101–111)
CREATININE: 0.9 mg/dl (ref 0.4–1.2)
ESTIMATED GLOMERULAR FILT RATE: >60 mL/min (ref 60–116)
Glucose Random: 131 mg/dl (ref 74–160)
POTASSIUM: 4.3 mmol/L (ref 3.5–5.1)
SODIUM: 138 mmol/L (ref 135–144)
TOTAL PROTEIN: 7.4 g/dl (ref 5.9–7.5)

## 2008-11-01 LAB — CHG ASSAY OF FERRITIN: FERRITIN: 63 ng/mL (ref 11–307)

## 2008-11-01 LAB — THYROID SCREEN TSH REFLEX FT4: THYROID SCREEN TSH REFLEX FT4: 2.83 u[IU]/mL (ref 0.34–5.60)

## 2008-11-04 LAB — OVA AND PARASITES: OVA AND PARASITES: NONE SEEN

## 2008-11-04 LAB — GASTROINTESTINAL CULTURE
SHIGA TOXIN 1: NOT DETECTED
SHIGA TOXIN 2: NOT DETECTED

## 2008-11-05 ENCOUNTER — Telehealth (HOSPITAL_BASED_OUTPATIENT_CLINIC_OR_DEPARTMENT_OTHER): Payer: Self-pay | Admitting: Internal Medicine

## 2008-11-05 NOTE — Telephone Encounter (Signed)
Left message that all bloodwork and stool exams are normal. If she does not feel improved on lactose free diet, she should contact office to make appt with Dr. Chilton Si.

## 2008-11-11 ENCOUNTER — Telehealth (HOSPITAL_BASED_OUTPATIENT_CLINIC_OR_DEPARTMENT_OTHER): Payer: Self-pay | Admitting: Ambulatory Care

## 2008-11-11 NOTE — Telephone Encounter (Signed)
Spoke with pt. States still having diarrhea. Seen last month by [facility] and tests were negative.   Did cut out dairy milk and cheese. Does eat rice and beans, coffee one in AM. Advised cooked plain rice is okay. Cut out beans and any gassy foods. Given appt with pcp next week- call if changed or worsening prior to appt. Push clear liqs.

## 2008-11-11 NOTE — Telephone Encounter (Signed)
Staff Message copied by Windell Moulding on Mon Nov 11, 2008 9:44 AM  ------   Message from: Joneen Roach   Created: Mon Nov 11, 2008 9:25 AM   Regarding: sick call   Contact: [phone]    Loretta Martin is a 48 year old old female.    Patient's PCP: Arlice Colt, MD    In case we get disconnected what is the best number to reach you at today:  Home Phone [phone] (home)    Person calling:  Patient (self)    How can I help you today:   Sick Call:   What is your main complaint today?  Patient wanted to be seen by Dr. Chilton Si, she said she was seen a few weeks ago for diarrhea, it hasn't gone away wanted to see someome.    The nurse will call you back within one hour. She may call you at anytime. Can you remain at your telephone for the next 60 minutes?  No   Please call us back when you will be able to stay at your phone for 60 minutes. For an emergency, call 911.       Patient's language of care: [place]    Would you like an interpreter when the nurse calls you back?  NO

## 2008-11-19 ENCOUNTER — Encounter (HOSPITAL_BASED_OUTPATIENT_CLINIC_OR_DEPARTMENT_OTHER): Payer: Self-pay | Admitting: Internal Medicine

## 2008-11-19 ENCOUNTER — Ambulatory Visit (HOSPITAL_BASED_OUTPATIENT_CLINIC_OR_DEPARTMENT_OTHER): Payer: No Typology Code available for payment source | Admitting: Internal Medicine

## 2008-11-19 VITALS — BP 100/68 | HR 106 | Temp 99.9°F | Wt 143.0 lb

## 2008-11-19 DIAGNOSIS — K589 Irritable bowel syndrome without diarrhea: Secondary | ICD-10-CM

## 2008-11-19 LAB — GAMMA GLUTAMYL TRANSFERASE: GAMMA GLUTAMYL TRANSFERASE: 12 IU/L (ref 5–71)

## 2008-11-19 MED ORDER — AF-LOPERAMIDE HCL 1 MG/5ML PO LIQD
ORAL | Status: AC
Start: 2008-11-19 — End: 2008-12-17

## 2008-11-19 MED ORDER — METAMUCIL 30.9 % PO POWD
ORAL | Status: DC
Start: 2008-11-19 — End: 2009-04-30

## 2008-11-19 NOTE — Progress Notes (Signed)
SUBJECTIVE:  Loretta Martin is a 48 year old female here for follow up of diarrhea with the following Problems and Medications    Patient Active Problem List:     TUBERCULIN TEST REACTION NO TBC [795.5]     DEPRESSIVE DISORDER NEC [311]     MAMMOGRAPHIC MICROCALCIFICATION [793.81]     COUGH [786.2]     IBS (Irritable Bowel Syndrome) [564.1U]     DPH-CARE COORDINATION PROGRAM PARTICIPANT [[id number]]     Back Pain [724.5E]        Current outpatient prescriptions:  MIRCETTE 0.15-0.02/0.01 MG (21/5) OR TABS one daily Disp: 1 Rfl: 5   PROAIR HFA 108 (90 BASE) MCG/ACT IN AERS (ALBUTEROL HFA) 2 puffs as needed for cough Disp: 1 Rfl: 5       DIARHEA  - states it began approx one year ago; intermittently, although I have records of this complaint going back at least 3 years  - initially sx related to what she was eating especially coffee.  - over past two months, occuring more frequently, and with most foods  - more recently, she has had some urgency withcramping sensation followed by need to defecate; occ accompanied by nausea.  1 episode of fecal incontinence whent he bathroom was ":too far."  - her belly feels swollen and bloated.  - stool starts a little firm , but always ends up liquid.  Now always and only occurs after eating.    - lives with her three children at home; none of them are having GI problems.  - last time out of the country was in 2004 to [place].  - no other OTC meds or vitamins  - light ETOH use (1-2 drinks twice per week)  - 1 cup coffee daily; does have daily dairy products including cheese    No fevers or chills.  Occ night sweats - occ once per week.  Denies palpitations  No episodes of constipation.  Weight has increased in the last year, but has not changed acutely.    Grandmother "died with this problem"    Patient clearly very concerned about sequellae and underduiagnosis.  I discussed why her sx relateing to food intake were much more likely to be related to IBS than any other sx, especially given  her recent benign work up.  Explained some aspects of physiology of IBS, although pathophys not understood.         Social History   Marital Status: Divorced  Spouse Name: N/A    Years of Education: N/A  Number of Children: 3     Occupational History  housecleaning       Social History Main Topics   Tobacco Use: Quit     Quit date:  08/10/2004    Comment: smoked only for 6 months.    Alcohol Use: Yes    Comment: sporadically    Drug Use: No    Sexually Active: Not Currently  Partner(s): Female    Birth Control/ Protection: Surgical      Comment: men age 89 q mont, menorrhagia 2006, past history of syphillis and rx , 1st SA age 29, 1 lietime partners, separated again from her husband.       Other Topics Concern   None     Social History Narrative    10/30/2008    To [place] from [place] in 2001    Lives with 3 children, 2 older children working    Works housecleaning    Separted from husband  x 3 months, he helps with financial support of youngest child     OBJECTIVE:  BP 100/68   Pulse 106   Temp (Src) 99.9 F (37.7 C) (Oral)   Wt 143 lb (64.864 kg)   SpO2 97%   LMP 11/14/2008  Pain Score: 8 (8/10)  Appearance: NAD    564.1U IBS (Irritable Bowel Syndrome)  (primary encounter diagnosis)  Comment: patient nervous and explained at length mild OTC meds, and benign nature of IBS.  Will get GI evaluation as she remains anxious  Plan: GAMMA-GLUTAMYL TRANSFERAS, ROUTINE         VENIPUNCTURE, METAMUCIL 30.9 % OR POWD,         AF-LOPERAMIDE HCL 1 MG/5ML OR LIQD, REFERRAL TO        GASTROENTEROLOGY (INT)     > 50 % of this visit spent in counselling  Face to face 35 minutes  I have reviewed the past medical, surgical, social and family history and updated these sections of EpicCare as relevant. All interim labs, test results, and consult notes were reviewed and discussed with Loretta Martin. Medications were reconciled during this visit and a current medication list was given to the patient at the end of the  visit.

## 2009-01-15 ENCOUNTER — Ambulatory Visit (HOSPITAL_BASED_OUTPATIENT_CLINIC_OR_DEPARTMENT_OTHER): Payer: Self-pay | Admitting: Gastroenterology

## 2009-02-21 ENCOUNTER — Ambulatory Visit (HOSPITAL_BASED_OUTPATIENT_CLINIC_OR_DEPARTMENT_OTHER): Payer: Self-pay | Admitting: Internal Medicine

## 2009-02-21 LAB — MA SCREENING MAMMO BILATERAL WITH CAD

## 2009-02-27 ENCOUNTER — Telehealth (HOSPITAL_BASED_OUTPATIENT_CLINIC_OR_DEPARTMENT_OTHER): Payer: Self-pay | Admitting: Registered Nurse

## 2009-02-27 NOTE — Telephone Encounter (Signed)
With the [employer] on phone, T/C to pt,  Message left to call back [facility] at [phone]

## 2009-02-27 NOTE — Telephone Encounter (Signed)
Staff Message copied by Otilio Miu on Thu Feb 27, 2009 11:44 AM  ------   Message from: Encarnacion Chu   Created: Thu Feb 27, 2009 11:24 AM   Regarding: other   Contact: [phone]    Loretta Martin [id number], 48 year old, female, Telephone Information:  Home Phone [phone]  Work Phone [phone]  Mobile [phone]      CALL BACK NUMBER: home  Cell phone:   Other phone:    Available times:    Patient's language of care: [place]    Patient needs a [place] interpreter.    Patient's PCP: Arlice Colt, MD    Person calling on behalf of patient: Patient (self)    Calls today with questions and concerns. About medication to regulate bleeding. Pt doesn't know the name of med.

## 2009-03-05 NOTE — Telephone Encounter (Signed)
Due to the language barrier, the phone call was conducted in [place] with an interpreter. The interpreter's name is [place]. The interpreter was on the phone during the entire phone call.    TC to pt who states she has been taking Mircette to control her periods and it has been working fine until May. She started bleeding before she was supposed to. She was due for her period around today but has been bleeding for the past few days. It is not heavy but she needs to wear a pad and it is brown in color. Denies any skipped/missed pils.    She needs to start a new pack on Sunday but doesn't want to if this is not working anymore.    Forward to [employer]

## 2009-03-05 NOTE — Telephone Encounter (Signed)
Please educate pt that ocassionally people will have BTB on OCP's, especially if any taken late.  Just because this happens once does not mean OCP is not working or that will continue to happen.  Pt should start new pack on Sunday as scheduled and observe over next few months to see what happens. Pt can schedule her 6 month OCP check and will reassess need to change OCP then

## 2009-03-05 NOTE — Telephone Encounter (Signed)
Due to the language barrier, the phone call was conducted in [place] with an interpreter. The interpreter's name is Dirce. The interpreter was on the phone during the entire phone call.    TC to pt Left message I am calling back with Evelyn's answer and she can call [facility] back and ask to speak to a nurse.

## 2009-03-05 NOTE — Telephone Encounter (Signed)
Due to the language barrier, the phone call was conducted in [place] with an interpreter. The interpreter's name is Dirce. The interpreter was on the phone during the entire phone call.  Pt told that BTB was not uncommon and she should start new pack Sunday. She said she only has 2 refills so I explained she would need her 6 month OCP check and could get her prescription then.  Pt agrees to this, transferred to front desk to make an appt.

## 2009-03-05 NOTE — Telephone Encounter (Signed)
Due to the language barrier, the phone call was conducted in Portuguese with an interpreter. The interpreter's name is Dirce. The interpreter was on the phone during the entire phone call.    TC to pt Left message asking pt to call [facility] back.

## 2009-03-05 NOTE — Telephone Encounter (Signed)
Staff Message copied by Lorri Frederick on Wed Mar 05, 2009 12:00 PM  ------   Message from: Encarnacion Chu   Created: Wed Mar 05, 2009 11:47 AM   Regarding: ret call    Auburn Hester [id number], 48 year old, female, Telephone Information:  Home Phone [phone]  Work Phone [phone]  Mobile [phone]      CALL BACK NUMBER: home  Cell phone:   Other phone:    Available times:    Patient's language of care: [place]    Patient needs a [place] interpreter.    Patient's PCP: Arlice Colt, MD    Person calling on behalf of patient: Patient (self)    Calls today returning phone call. From 02/27/09.

## 2009-03-18 ENCOUNTER — Ambulatory Visit: Payer: No Typology Code available for payment source | Admitting: Ophthalmology

## 2009-03-30 ENCOUNTER — Emergency Department (HOSPITAL_BASED_OUTPATIENT_CLINIC_OR_DEPARTMENT_OTHER)
Admission: RE | Admit: 2009-03-30 | Disposition: A | Discharge status: Home / Self Care | Payer: Self-pay | Source: Emergency Department | Attending: Emergency Medicine | Admitting: Emergency Medicine

## 2009-03-30 ENCOUNTER — Encounter (HOSPITAL_BASED_OUTPATIENT_CLINIC_OR_DEPARTMENT_OTHER): Payer: Self-pay

## 2009-03-30 LAB — COMPREHENSIVE METABOLIC PANEL
ALANINE AMINOTRANSFERASE: 19 IU/L (ref 7–35)
ALBUMIN: 3.6 g/dl (ref 3.4–4.8)
ALKALINE PHOSPHATASE: 63 IU/L (ref 25–106)
ANION GAP: 8 mmol/L (ref 2–25)
ASPARTATE AMINOTRANSFERASE: 60 IU/L — ABNORMAL HIGH (ref 8–34)
BILIRUBIN TOTAL: 2 mg/dl — ABNORMAL HIGH (ref 0.2–1.1)
BUN (UREA NITROGEN): 6 mg/dl (ref 6–20)
CALCIUM: 8.7 mg/dl (ref 8.6–10.3)
CARBON DIOXIDE: 23 mmol/L (ref 22–32)
CHLORIDE: 104 mmol/L (ref 101–111)
CREATININE: 0.7 mg/dl (ref 0.4–1.2)
ESTIMATED GLOMERULAR FILT RATE: >60 mL/min (ref >60)
Glucose Random: 120 mg/dl (ref 74–160)
POTASSIUM: 5.6 mmol/L — ABNORMAL HIGH (ref 3.5–5.1)
SODIUM: 135 mmol/L (ref 135–144)
TOTAL PROTEIN: 6.7 g/dl (ref 5.9–7.5)

## 2009-03-30 LAB — MANUAL WBC DIFFERENTIAL
ATYPICAL LYMPHOCYTES %: 3 % (ref 0–12)
BAND NEUTROPHILS %: 21 % (ref 0–8)
LYMPHOCYTES %: 7 % — ABNORMAL LOW (ref 13.0–39.0)
MONOCYTES %: 5 % (ref 1–12)
PLATELET ESTIMATE: NORMAL
POLYMORPHONUCLEAR (SEGS) %: 64 % (ref 46.0–79.0)

## 2009-03-30 LAB — BLOOD COUNT COMPLETE AUTOMATED
HEMATOCRIT: 37.3 % (ref 36.0–48.0)
HEMOGLOBIN: 13.1 g/dl (ref 12.0–16.0)
MEAN CORP HGB CONC: 35.1 g/dl (ref 32.0–36.0)
MEAN CORPUSCULAR HGB: 34.4 pg — ABNORMAL HIGH (ref 27.0–33.0)
MEAN CORPUSCULAR VOL: 97.9 fl (ref 80.0–100.0)
MEAN PLATELET VOLUME: 8.3 fl (ref 6.4–10.8)
PLATELET COUNT: 178 10*3/uL (ref 150–400)
RBC DISTRIBUTION WIDTH: 12.3 % (ref 11.5–14.3)
RED BLOOD CELL COUNT: 3.81 M/uL — ABNORMAL LOW (ref 4.50–5.10)
WHITE BLOOD CELL COUNT: 16.8 10*3/uL — ABNORMAL HIGH (ref 4.0–10.8)

## 2009-03-30 MED ORDER — PERCOCET 5-325 MG PO TABS
ORAL_TABLET | ORAL | Status: AC
Start: 2009-03-30 — End: 2009-04-06

## 2009-03-30 MED ORDER — MOTRIN 600 MG PO TABS
ORAL_TABLET | ORAL | Status: AC
Start: 2009-03-30 — End: 2009-04-29

## 2009-03-30 MED ORDER — AZITHROMYCIN 250 MG OR CAPS
ORAL_CAPSULE | ORAL | Status: AC
Start: 2009-03-31 — End: 2009-04-05

## 2009-03-30 NOTE — Discharge Instructions (Signed)
Rest and drink plenty  Of fluids.  Salt water gargles and throat lozenges may help your discomfort.  Beginning taking antibiotic tomorrow.  You may take pain medication as prescribed.  Return or see your primary care provider in 2 days if not improved or if your symptoms worsen in any way.

## 2009-03-30 NOTE — ED Notes (Signed)
Pt to ed c.o sore throat for 3 days. C.o fever and general body aches

## 2009-03-31 NOTE — Progress Notes (Signed)
Quick Note:    Reviewed patient of appropriate treatment  Hitomi Slape C. Abdirizak Richison, PA-C    ______

## 2009-04-01 LAB — EMERGENCY ROOM NOTE

## 2009-04-14 ENCOUNTER — Encounter (HOSPITAL_BASED_OUTPATIENT_CLINIC_OR_DEPARTMENT_OTHER): Payer: Self-pay

## 2009-04-14 NOTE — Progress Notes (Signed)
Epic message sent to Marilu Favre to navigate for colon cancer screening.

## 2009-04-15 ENCOUNTER — Encounter (HOSPITAL_BASED_OUTPATIENT_CLINIC_OR_DEPARTMENT_OTHER): Payer: Self-pay

## 2009-04-23 ENCOUNTER — Encounter (HOSPITAL_BASED_OUTPATIENT_CLINIC_OR_DEPARTMENT_OTHER): Payer: Self-pay | Admitting: Ophthalmology

## 2009-04-23 ENCOUNTER — Ambulatory Visit (HOSPITAL_BASED_OUTPATIENT_CLINIC_OR_DEPARTMENT_OTHER): Payer: No Typology Code available for payment source | Admitting: Ophthalmology

## 2009-04-23 DIAGNOSIS — H524 Presbyopia: Secondary | ICD-10-CM

## 2009-04-23 DIAGNOSIS — H43819 Vitreous degeneration, unspecified eye: Secondary | ICD-10-CM

## 2009-04-23 HISTORY — DX: Vitreous degeneration, unspecified eye: H43.819

## 2009-04-23 HISTORY — DX: Presbyopia: H52.4

## 2009-04-23 NOTE — Nursing Note (Signed)
>>   Dina Luis-Travassos,OT     Wed Apr 23, 2009  9:12 AM  Pt sees some black spots off and on X's 6 months.  NO FLASHES.  Distance VA is fine SC.  Pt having trouble reading small print CC.  Gets HA's when reading.

## 2009-04-23 NOTE — Progress Notes (Signed)
Presbyopia.  Use +2.50 over the counter readers.    The patient has entopsias (floaters) and photopsias (flashes), with spots in the vision.  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  There is no retinal detachment, or retinal tears.  She is instructed to follow up if she has any increase in the spots in her vision, increase in flashes of light, or a curtain blocking part of her vision.    Marland Kitchen

## 2009-04-28 ENCOUNTER — Telehealth (HOSPITAL_BASED_OUTPATIENT_CLINIC_OR_DEPARTMENT_OTHER): Payer: Self-pay

## 2009-04-28 NOTE — Telephone Encounter (Signed)
Call to patient, states that she is having pain on feet and back for a long time.sometimes worsening.would like physical therapy to treat.  Has also been seeing black spots for the past six months.states that started Friday and have not resolved.had just seen eye doctor and informed and was told to call pcp and inform.states that previously the spots would go be intermittent.no openings today.advised would discuss with provider and get back to her.dt,rn

## 2009-04-28 NOTE — Telephone Encounter (Signed)
Staff Message copied by Sharlyne Pacas on Mon Apr 28, 2009 9:32 AM  ------   Message from: Lorenza Chick   Created: Mon Apr 28, 2009 9:15 AM   Regarding: triage - pain on her feet and back   Contact: [phone]    Loretta Martin is a 48 year old old female.    Patient's PCP: Arlice Colt, MD    In case we get disconnected what is the best number to reach you at today:  Home Phone [phone] (home)    Person calling:  Patient (self)    How can I help you today:   Other she is c/o of pain on her feet and back. She said she can't plant her feet on the floor.     Patient's language of care: [place]    Would you like an interpreter when the nurse calls you back?  NO

## 2009-04-28 NOTE — Telephone Encounter (Signed)
What time?

## 2009-04-28 NOTE — Telephone Encounter (Signed)
I can add her in Wednesday Morning.

## 2009-04-28 NOTE — Telephone Encounter (Signed)
Call back to patient,scheduled for Wednesday 1120am for eval.advised to er if in the meantime any other issues.agrees.tranferred to pre reg where as has not been here in 45 days

## 2009-04-28 NOTE — Telephone Encounter (Signed)
I'll be here all morning.  Whenever works for her and the clinic

## 2009-04-30 ENCOUNTER — Ambulatory Visit (HOSPITAL_BASED_OUTPATIENT_CLINIC_OR_DEPARTMENT_OTHER): Payer: No Typology Code available for payment source | Admitting: Internal Medicine

## 2009-04-30 VITALS — BP 120/80 | HR 101 | Temp 98.3°F | Ht 62.5 in | Wt 131.0 lb

## 2009-04-30 DIAGNOSIS — M549 Dorsalgia, unspecified: Secondary | ICD-10-CM

## 2009-04-30 DIAGNOSIS — H43819 Vitreous degeneration, unspecified eye: Secondary | ICD-10-CM

## 2009-04-30 DIAGNOSIS — M542 Cervicalgia: Secondary | ICD-10-CM

## 2009-04-30 DIAGNOSIS — M79673 Pain in unspecified foot: Secondary | ICD-10-CM

## 2009-04-30 DIAGNOSIS — K589 Irritable bowel syndrome without diarrhea: Secondary | ICD-10-CM

## 2009-04-30 NOTE — Progress Notes (Signed)
HPI Comments: Loretta Martin is a 48 year old female who was added in to clinic and has 2 concerns.  1) black spots in vision.  She just saw ophthalmologist, and was diagnosed witrh posterior vitreous detachment, but she did not understand this diagnosis, so I educated her about this.  2)  Has ongoing back neck and foot pain.  She had been getting PT 6 months ago, but had to stop due to insurance issues.  Wants to return.  No acute trauma.  Does get burning under the feet sometimes, like stepping on something hot, but it is intermittant.    3) did not get GI visit, but needs this rescheduled  Back Pain   The history is provided by the patient. The history is limited by language barrier. This is a recurrent problem. The current episode started more than 1 month ago. The problem has been occurring daily. The problem has been unchanged since onset. The pain is associated with no known injury. Pain is located in the lumbar spine and thoracic spine. Quality: dull. The pain is at a severity of 4/10. The pain is medium. The pain is worse during the day. There has been leg pain.  There has been no fever, no numbness, no weight loss, no abdominal pain, no paresis, no tingling and no weakness. She has tried NSAIDs, heat and ice for the symptoms.     Review of Systems   Constitutional: Negative for fever, weight loss and weakness.   Cardiovascular: Negative.    Gastrointestinal: Negative for abdominal pain.   Musculoskeletal: Positive for back pain.   Neurological: Negative for tingling.     Physical Exam   Vitals reviewed.  Constitutional: She appears well-developed and well-nourished.   HENT:   Head: Normocephalic and atraumatic.   Neck: Normal range of motion. Neck supple.   Musculoskeletal: Normal range of motion.       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

## 2009-05-07 ENCOUNTER — Encounter (HOSPITAL_BASED_OUTPATIENT_CLINIC_OR_DEPARTMENT_OTHER): Payer: Self-pay | Admitting: Nurse Practitioner

## 2009-05-07 ENCOUNTER — Ambulatory Visit (HOSPITAL_BASED_OUTPATIENT_CLINIC_OR_DEPARTMENT_OTHER): Payer: No Typology Code available for payment source | Admitting: Nurse Practitioner

## 2009-05-07 VITALS — BP 110/70 | Wt 132.0 lb

## 2009-05-07 DIAGNOSIS — N920 Excessive and frequent menstruation with regular cycle: Secondary | ICD-10-CM

## 2009-05-07 NOTE — Progress Notes (Signed)
48 year old [id number]  LMP - end of OCP pack, Mircetter    Here for more OCP  Using OCP's to control menorrhagia/dysmenorrhea x 3-4 yrs rather than sonohystogram  BCM - TL  No ACHES    Mammogram 02/2009 nl    Starting to notes some hot flashes    O: deferred  A: 48 yo woman on OCP's for menorrhagia  P: to stop OCP's, calendar menses, if any irregular bleeding, spotting, menorrhagia, significant dysmenorrhea or bleeding after absence of menses x 6 months to f/u with Dr. Raylene Miyamoto      If all above well RTC annual gyn/pap due 11/2009      Discussed menopausal symptoms, process      Pt comfortable with plan    15 minutes spent with pt - 100% in counseling

## 2009-05-09 ENCOUNTER — Telehealth (HOSPITAL_BASED_OUTPATIENT_CLINIC_OR_DEPARTMENT_OTHER): Payer: Self-pay | Admitting: Ambulatory Care

## 2009-05-09 ENCOUNTER — Telehealth (HOSPITAL_BASED_OUTPATIENT_CLINIC_OR_DEPARTMENT_OTHER): Payer: Self-pay | Admitting: Nurse Practitioner

## 2009-05-09 ENCOUNTER — Telehealth (HOSPITAL_BASED_OUTPATIENT_CLINIC_OR_DEPARTMENT_OTHER): Payer: Self-pay

## 2009-05-09 NOTE — Telephone Encounter (Signed)
Message copied by Windell Moulding on Fri May 09, 2009  4:21 PM  ------       Message from: Lorenza Chick       Created: Fri May 09, 2009  4:02 PM       Regarding: triage - feet problem?       Contact: [phone]         Cherylyn Sundby is a 48 year old old female.              Patient's PCP: Arlice Colt, MD              In case we get disconnected what is the best number to reach you at today:       Home Phone [phone] (home)              Person calling:       Patient (self)              How can I help you today:        Other she is c/o of burning sensation on her feet and difficult walking. She wants to see a specialist.               Patient's language of care: [place]              Would you like an interpreter when the nurse calls you back?       NO

## 2009-05-09 NOTE — Telephone Encounter (Signed)
OK with plan as set by RN - unable to access that phone encounter as it was previously closed.

## 2009-05-09 NOTE — Telephone Encounter (Signed)
TC pt via [employer] portuguese interp    Pt states she was seen by [name] for sx of menopause and was told to call if sx occur and make appt with Dr.Chaudhuri.  States she is now having sx such as hot flashes and sweats 24hrs a day.  Pt requests appt for evaluation of sx with Dr.Chaudhuri.  Pt states she has also been experiencing intermittent leg and arm pain - states she has pain now in both arms and it feels like mild muscle pain - states she also has pain in her legs and back.  Denies difficulty breathing at this time but states she has noticed shortness of breath upon going up stairs.  Onset of sx 2 days    Pt advised to go to the ER now for above sx of pain.  Appt for sx of menopause given to pt with Dr.Chaudhuri on 05/15/09    Pt agrees with above and states she has someone to take her to the ER now.    Forward to K.Conrad Burlington triage NP

## 2009-05-09 NOTE — Telephone Encounter (Signed)
Patient calls with c/o burning sensation both feet. States has had in the past and was told to wear special sneakers at work. Has flared up again- given appt Monday with Charolotte Eke per her busy work schedule.

## 2009-05-09 NOTE — Telephone Encounter (Signed)
Message copied by Ardeen Fillers on Fri May 09, 2009 10:54 AM  ------       Message from: Encarnacion Chu       Created: Fri May 09, 2009 10:45 AM       Regarding: menopause Sx       Contact: [phone]         Loretta Martin [id number], 48 year old, female, Telephone Information:       Home Phone      [phone]       Work Phone      Not on file.       Mobile          [phone]                     CALL BACK NUMBER: home       Cell phone:        Other phone:              Available times:              Patient's language of care: English              Patient needs a [place] interpreter.              Patient's PCP: Arlice Colt, MD              Person calling on behalf of patient: Patient (self)              Calls today               Complaining of menopause Sx

## 2009-05-09 NOTE — Telephone Encounter (Signed)
Error

## 2009-05-12 ENCOUNTER — Encounter (HOSPITAL_BASED_OUTPATIENT_CLINIC_OR_DEPARTMENT_OTHER): Payer: Self-pay

## 2009-05-12 ENCOUNTER — Telehealth (HOSPITAL_BASED_OUTPATIENT_CLINIC_OR_DEPARTMENT_OTHER): Payer: Self-pay

## 2009-05-12 ENCOUNTER — Ambulatory Visit (HOSPITAL_BASED_OUTPATIENT_CLINIC_OR_DEPARTMENT_OTHER): Payer: No Typology Code available for payment source | Admitting: Nurse Practitioner

## 2009-05-12 ENCOUNTER — Inpatient Hospital Stay (HOSPITAL_BASED_OUTPATIENT_CLINIC_OR_DEPARTMENT_OTHER)
Admission: RE | Admit: 2009-05-12 | Disposition: A | Discharge status: Home / Self Care | Payer: Self-pay | Source: Emergency Department

## 2009-05-12 LAB — BLOOD COUNT COMPLETE AUTO&AUTO DIFRNTL WBC
BASOPHIL %: 0.2 % (ref 0.0–2.0)
EOSINOPHIL %: 1.1 % (ref 0.0–7.0)
HEMATOCRIT: 38.5 % (ref 36.0–48.0)
HEMOGLOBIN: 13.2 g/dl (ref 12.0–16.0)
LYMPHOCYTE %: 38 % (ref 13.0–39.0)
MEAN CORP HGB CONC: 34.4 g/dl (ref 32.0–36.0)
MEAN CORPUSCULAR HGB: 33.2 pg — ABNORMAL HIGH (ref 27.0–33.0)
MEAN CORPUSCULAR VOL: 96.6 fl (ref 80.0–100.0)
MEAN PLATELET VOLUME: 7.7 fl (ref 6.4–10.8)
MONOCYTE %: 5.4 % (ref 1.0–12.0)
NEUTROPHIL %: 55.3 % (ref 46.0–79.0)
PLATELET COUNT: 215 10*3/uL (ref 150–400)
RBC DISTRIBUTION WIDTH: 11.8 % (ref 11.5–14.3)
RED BLOOD CELL COUNT: 3.98 M/uL — ABNORMAL LOW (ref 4.50–5.10)
WHITE BLOOD CELL COUNT: 7 10*3/uL (ref 4.0–10.8)

## 2009-05-12 LAB — COMPREHENSIVE METABOLIC PANEL
ALANINE AMINOTRANSFERASE: 15 IU/L (ref 7–35)
ALBUMIN: 4 g/dl (ref 3.4–4.8)
ALKALINE PHOSPHATASE: 58 IU/L (ref 25–106)
ANION GAP: 10 mmol/L (ref 2–25)
ASPARTATE AMINOTRANSFERASE: 37 IU/L — ABNORMAL HIGH (ref 8–34)
BILIRUBIN TOTAL: 1 mg/dl (ref 0.2–1.1)
BUN (UREA NITROGEN): 12 mg/dl (ref 6–20)
CALCIUM: 9.6 mg/dl (ref 8.6–10.3)
CARBON DIOXIDE: 26 mmol/L (ref 22–32)
CHLORIDE: 102 mmol/L (ref 101–111)
CREATININE: 0.5 mg/dl (ref 0.4–1.2)
ESTIMATED GLOMERULAR FILT RATE: >60 mL/min (ref >60)
Glucose Random: 110 mg/dl (ref 74–160)
POTASSIUM: 4.8 mmol/L (ref 3.5–5.1)
SODIUM: 138 mmol/L (ref 135–144)
TOTAL PROTEIN: 7.5 g/dl (ref 5.9–7.5)

## 2009-05-12 LAB — D-DIMER PE/DVT, QUANTITATIVE: D-DIMER PE/DVT, QUANTITATIVE: 2.47 mg/L FEU (ref 0.00–0.49)

## 2009-05-12 LAB — PROTHROMBIN TIME
INR: 1 — ABNORMAL LOW (ref 2.0–3.5)
PROTHROMBIN TIME: 10 SECONDS (ref 9.5–11.5)

## 2009-05-12 LAB — TROPONIN I: TROPONIN I: 0.02 ng/mL (ref 0.00–0.04)

## 2009-05-12 LAB — CK-MB PROFILE
CK INDEX: 1.2 %
CKMB: 0.9 ng/mL (ref 0.0–4.0)
CREATINE KINASE TOTAL: 77 IU/L (ref 21–215)

## 2009-05-12 LAB — H&P

## 2009-05-12 LAB — CTA CHEST W &/OR WO CONTRAST

## 2009-05-12 LAB — EMERGENCY ROOM NOTE

## 2009-05-12 LAB — APTT: APTT: 24.5 SECONDS (ref 23.7–33.3)

## 2009-05-12 NOTE — Telephone Encounter (Signed)
Left message for pt that she can go to the ER if urgent, otherwise call triage prior to 430 or to ER

## 2009-05-12 NOTE — ED Notes (Signed)
Bed:07<BR> Expected date:<BR> Expected time:<BR> Means of arrival:<BR> Comments:<BR> DASILVA IS IN CT

## 2009-05-12 NOTE — Telephone Encounter (Signed)
Spoke with pt. Had an appt today but couldn't make it due to chest pain.  States 4 occurences today where she had abdominal pain "then it goes up to my chest", "worst pain ever" describes 10/10. Had SOB during the episode. Advised to ER now. Agrees to plan Advised call for f/u

## 2009-05-12 NOTE — Telephone Encounter (Signed)
Message copied by Ledell Peoples on Mon May 12, 2009 11:49 AM  ------       Message from: Lorenza Chick       Created: Mon May 12, 2009 11:32 AM       Regarding: triage - pain        Contact: [phone]         Loretta Martin is a 48 year old old female.              Patient's PCP: Arlice Colt, MD              In case we get disconnected what is the best number to reach you at today:       Home Phone [phone] (home)              Person calling:       Patient (self)              How can I help you today:        Other pt. Is c/o of having a little chest pain and not feeling well. She had an appt. This morning at 10 with Orlie Pollen but missed because she said she wasn't feeling well enough to come but he wants to come now.              Patient's language of care: [place]              Would you like an interpreter when the nurse calls you back?       NO

## 2009-05-12 NOTE — ED Notes (Signed)
Pt states she has had epigastric pain that radiates to her chest since this AM. Pt states pain comes and goes.

## 2009-05-12 NOTE — Telephone Encounter (Signed)
Call to patient - message left on voice mail to call clinic and speak with nurses. Murial Beam, RN

## 2009-05-12 NOTE — Telephone Encounter (Signed)
Message copied by Windell Moulding on Mon May 12, 2009  1:26 PM  ------       Message from: Silver Huguenin       Created: Mon May 12, 2009  1:21 PM       Regarding: pt returning call       Contact: [phone]         Loretta Martin is a 48 year old old female.              Patient's PCP: Arlice Colt, MD              In case we get disconnected what is the best number to reach you at today:       Home Phone [phone] (home)              Person calling:       Patient (self)              How can I help you today:        Other Pt is returning call. She had an appointment today and missed the appointment. Pt states she has been having intermittent chest pains and would like to reschedule the appointment for today if possible.              Patient's language of care: [place]              Would you like an interpreter when the nurse calls you back?       NO

## 2009-05-13 ENCOUNTER — Telehealth (HOSPITAL_BASED_OUTPATIENT_CLINIC_OR_DEPARTMENT_OTHER): Payer: Self-pay | Admitting: Ambulatory Care

## 2009-05-13 LAB — BLOOD COUNT COMPLETE AUTOMATED
HEMATOCRIT: 37.8 % (ref 36.0–48.0)
HEMOGLOBIN: 12.9 g/dl (ref 12.0–16.0)
MEAN CORP HGB CONC: 34.2 g/dl (ref 32.0–36.0)
MEAN CORPUSCULAR HGB: 33.4 pg — ABNORMAL HIGH (ref 27.0–33.0)
MEAN CORPUSCULAR VOL: 97.8 fl (ref 80.0–100.0)
MEAN PLATELET VOLUME: 8 fl (ref 6.4–10.8)
PLATELET COUNT: 193 10*3/uL (ref 150–400)
RBC DISTRIBUTION WIDTH: 12.2 % (ref 11.5–14.3)
RED BLOOD CELL COUNT: 3.86 M/uL — ABNORMAL LOW (ref 4.50–5.10)
WHITE BLOOD CELL COUNT: 5.6 10*3/uL (ref 4.0–10.8)

## 2009-05-13 LAB — URINALYSIS
BILIRUBIN, URINE: NEGATIVE
CASTS: NONE SEEN PER LPF
CRYSTALS: NONE SEEN
GLUCOSE, URINE: NEGATIVE MG/DL
KETONE, URINE: NEGATIVE MG/DL
NITRITE, URINE: NEGATIVE
OCCULT BLOOD, URINE: NEGATIVE
PH URINE: 5.5 (ref 5.0–8.0)
PROTEIN, URINE: NEGATIVE MG/DL
RED BLOOD CELLS URINE: NONE SEEN PER HPF (ref 0–2)
SPECIFIC GRAVITY URINE: 1.015 (ref 1.003–1.035)

## 2009-05-13 LAB — CHG HEPATIC FUNCTION PANEL
ALANINE AMINOTRANSFERASE: 19 IU/L (ref 7–35)
ALBUMIN: 3.7 g/dl (ref 3.4–4.8)
ALKALINE PHOSPHATASE: 61 IU/L (ref 25–106)
ASPARTATE AMINOTRANSFERASE: 25 IU/L (ref 8–34)
BILIRUBIN DIRECT: 0.1 mg/dl (ref 0.0–0.2)
BILIRUBIN TOTAL: 0.9 mg/dl (ref 0.2–1.1)
INDIRECT BILIRUBIN: 0.8 mg/dl (ref 0.2–0.9)
TOTAL PROTEIN: 7.3 g/dl (ref 5.9–7.5)

## 2009-05-13 LAB — CK-MB PROFILE
CK INDEX: 1.8 %
CKMB: 0.8 ng/mL (ref 0.0–4.0)
CREATINE KINASE TOTAL: 45 IU/L (ref 21–215)

## 2009-05-13 LAB — EKG

## 2009-05-13 LAB — XR CHEST PORTABLE

## 2009-05-13 LAB — TROPONIN I: TROPONIN I: 0.03 ng/mL (ref 0.00–0.04)

## 2009-05-14 ENCOUNTER — Encounter (HOSPITAL_BASED_OUTPATIENT_CLINIC_OR_DEPARTMENT_OTHER): Payer: Self-pay

## 2009-05-14 LAB — URINE CULTURE/COLONY COUNT

## 2009-05-14 NOTE — Telephone Encounter (Signed)
Call to patient -message left on voice mail to call me at clinic.Await call back . Kearney Hard, RN

## 2009-05-14 NOTE — Telephone Encounter (Signed)
Patient calling back. Questionnaire completed. Reports difficulty with sleep. Scheduled in to see Dr.Green for fup 05-28-09.  Taking Proair prn. Denies ankle swelling, SOB, chest pain or pressure. Reports at time palpitations wake her up at night. Reviewed to avoid caffeine, chocolate. Smoking.Avoid all gas forming food in the interim.  Denies pain.   Reviewed and confirmed with patient all upcoming appointments. Agrees to attend.  Reviewed with patient emergency instructions per hospital DC summary - to go to ER if abd pain, nausea, vomiting, fever, palpitations last more than a few minutes. Agrees with plan.  Will call clinic with any questions,problems or concerns.

## 2009-05-14 NOTE — Patient Instructions (Signed)
Keep all scheduled appointments. Call clinic with questions,problems or concerns.   Go to ER if:  -Chest pain,pressure, nausea, vomiting, diarrhea, abd painfever, palpitations lasting more than few minutes or accompanied by SOB-agrees with plan.

## 2009-05-15 ENCOUNTER — Ambulatory Visit (HOSPITAL_BASED_OUTPATIENT_CLINIC_OR_DEPARTMENT_OTHER): Payer: No Typology Code available for payment source | Admitting: Obstetrics & Gynecology

## 2009-05-15 ENCOUNTER — Encounter (HOSPITAL_BASED_OUTPATIENT_CLINIC_OR_DEPARTMENT_OTHER): Payer: Self-pay | Admitting: Obstetrics & Gynecology

## 2009-05-15 VITALS — BP 116/80 | Wt 129.0 lb

## 2009-05-15 DIAGNOSIS — F329 Major depressive disorder, single episode, unspecified: Secondary | ICD-10-CM

## 2009-05-15 DIAGNOSIS — N951 Menopausal and female climacteric states: Secondary | ICD-10-CM

## 2009-05-15 DIAGNOSIS — M549 Dorsalgia, unspecified: Secondary | ICD-10-CM

## 2009-05-15 DIAGNOSIS — F3289 Other specified depressive episodes: Secondary | ICD-10-CM

## 2009-05-15 MED ORDER — IBUPROFEN 800 MG PO TABS
ORAL_TABLET | ORAL | Status: AC
Start: 2009-05-15 — End: 2009-06-14

## 2009-05-15 NOTE — Progress Notes (Signed)
S/  Admitted for observation recently for chest pain.  The pain comes every 6 months.  The heart rate was fast but no heart attach.  Has F/U with her PCP re this.  C/O hot flashes and fatigue since stopping OCPs.  Has back pain like when close to menstral period.  No more bleeding.  Last took OCP 7/23.  Had expected withdrawal bleed after that.  The back pain since LMP finished.  The period lasted for 5 days.  The back pain is not getting better or worse.  When she works it is a 10.  When she rests it gets better.  She also has an irritated mood.  Works Education officer, environmental houses.  Has to stop working when the pain comes.  Tylenol helps some.  She does not like taking medication.    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]     COUGH [786.2]     IBS (Irritable Bowel Syndrome) [564.1U]     DPH-CARE COORDINATION PROGRAM PARTICIPANT [55555]     Back Pain [724.5E]     Presbyopia [367.4]     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]    Obstetric History   G3  P3  T0  P1  TAB0  SAB0  E0  M0  L3     Comment: C/S X 2 followed by VBAC      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    Comment: treated [facility] 11/97 CSF VDRL neg 9/00 RPR 9/00 1:2 repeat RPR yearly     Unspecified Trichomoniasis 08/31/2004    Comment: 10/97    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    Comment: +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 - had negative CXR pp at [facility] referred to TB clinic    Nonspecific Abnormal Results of Thyroid Function Study 08/31/2004    Comment: increased TSH 4.19 6/00; TSH nl 9/00; 05/04/99 - wnl     Urinary Tract Infection, Site not Specified 08/31/2004    Comment: 4/02, 6/02    Depressive Disorder, not Elsewhere Classified 08/31/2004    Comment: 10/00 to Psych on Paxil, self d/c'd; 9/00 head CT: scattered punctate calcifications    Asthma     Presbyopia 04/23/2009    PVD (Posterior Vitreous Detachment) 04/23/2009         Past Surgical History    LIG/TRNSXJ FLP TUBE ABDL/VAG APPR UNI/BI 1998    Comment Tubal Ligation    REDUCTION MAMMAPLASTY 2004    Comment abdominoplasty in [place]    OB ANTEPARTUM CARE C DLVR&POSTPARTUM     Comment c/section x 2    EXCISION EXCESSIVE SKIN & SUBQ TISSUE ABDOMEN     BREAST BIOPSY & NEEDLE LOC WIR          Social History   Marital Status: Divorced  Spouse Name: N/A    Years of Education: N/A  Number of Children: 3     Occupational History  housecleaning  Social History Main Topics   Tobacco Use: Quit     Quit date: 08/10/2004    Comment: smoked only for 6 months.    Alcohol Use: Yes    Comment: sporadically    Drug Use: No    Sexually Active: Not Currently  Partner(s): Female    Birth Control/ Protection: Surgical    Comment: men age 86 q mont, menorrhagia 2006, past history of syphillis and rx , 1st SA age 51, 1 lietime partners, separated again from her husband.     Other Topics Concern   None on file     Social History Narrative    10/30/2008    To [place] from [place] in 2001    Lives with 3 children, 2 older children working    Works housecleaning    Separted from husband x 3 months, he helps with financial support of youngest child       Family History    Diabetes Mother    Hypertension Mother       O/  External genitalia slightly atrophic.  BUS neg.  Vagina and cervix normal to palpation.  Nontender, normal size, anteverted uterus.  No adnexal masses or tenderness.    A/  1.  Menopausal symptoms.    2.  Recurrence of symptoms of depression    3.  Back of the same type that she previously had before each menses.  Now the pain has lasted since the last menses ended  a few weeks ago.  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  Not bad at rest.  Ddx includes temporary exacerbation of adenomyosis due to perimenopausal state.  The pt has not had a pelvic MRI to look for adenomyosis but is at risk for it since she is S/P two cesarean sections.  Benign pelvic exam today goes against that diagnosis.  More likely musculoskeletal pain.    4.  Brief episodes of heart palpitations.    P/  1.  After a full discussion of the R/B/A of HRT pt has decided to wait and see if she feels better over time on an SSRI.    2.  Referred to PCP for depression.  Previously followed by psych.  She may benefit from venlofaxime as opposed to other SSRIs to improve perimenopausal symptoms as well as treat depressive symptoms.    3.  Advised a trial of ibuprofen.    4.  Encouraged to keep already scheduled appointment with PCP re this issue.      40 minute visit of which more than half was spent on consultation

## 2009-05-27 ENCOUNTER — Ambulatory Visit (HOSPITAL_BASED_OUTPATIENT_CLINIC_OR_DEPARTMENT_OTHER): Payer: No Typology Code available for payment source

## 2009-05-27 DIAGNOSIS — M549 Dorsalgia, unspecified: Secondary | ICD-10-CM

## 2009-05-27 NOTE — Patient Instructions (Addendum)
Rehabilitation Treatment Flowsheet    Precautions:    Date: 05/27/09         Initials: DL         Visit #: 1         Time: 60         HEP X         Treatment(s)                 IE                manipulation to T5                Thoracic extension over FR

## 2009-05-27 NOTE — Progress Notes (Signed)
PT LUMBAR/CERVICAL EVALUATION FORM    REFERRING PROVIDER:     Arlice Colt, MD  [address]  [place], [place] [address]     HX OF PRESENT ILLNESS:   Pt reports she developed back pain which is worsening and now presents in her upper back. She has been treated for her low back in physical therapy on a previous account however in the past 6 months her upper back is getting worse.  Pt no longer continues with exercises she learned for her low back 2/2 to loosing the papers. She reports poor compliance with proper body mechanics at work b/c she forgot. No numbness or tingling in the legs, pt does report a history of sciatica. She is also c/o bottom on the feet pain bilaterally.     Dressing:no difficulty  ADLS: brushing hair is painful.   Driving: impaired, pt needs to sit up straight. Also pain when in traffic for a long time.   Sleeping: pain, difficulty sleeping- drinks tea to assist.    DEPRESSION (PRIME-MD =  /2):  FABQ (TOTAL/ACTIVITY/WORK):    MENTAL STATUS/COMMUNICATION: WNL   PRIMARY LANGUAGE: [place]   REQUIRES INTERPRETER: No INTERPRETER PRESENT DURING EVAL: No      OBJECTIVE MEASUREMENTS:   POSTURE/ALIGNMENT: flattened thoracic spine with apparent/relative scapular winging 2/2 to decreased kyphosis, increased lumbar lordosis, pt sits with erect posture    NEUROMOTOR SCREEN: WNL   REFLEXES (1+-6+): 2+ UEs biceps, BR, wrist extensors and finger flexors SENSATION: WNL to light touch MYOTOMES: normal/symmetrical bilaterally in UEs.       PASSIVE JOINT MOBILITY: OSTEOKINEMATIC: Sidebending: R: 40 degrees, L: 50 degrees  Rotation: R: 45 degrees, L: 55 degrees  Flexion and extension: painful, but able to complete full ROM.  ARTHROKINEMATIC: see thoracic spine info below.    MUSCLE LENGTH:    PALPATION: tenderness to PA palpation/mobilization of the thoracic spine    FUNCTIONAL PERFORMANCE:    SKIN: WNL   CERVICAL SCREENING:   VERTEBRAL ARTERY:  UPPER CERVICAL STABILITY:  VERTIGO:    THORACIC SPINE: PA mobility in  thoracic spine is stiff and tender globally - increased stiffness at T4-T8   OTHER:    USE IMAGES ACTIVITY FOR CERVICAL AROM/ LUMBAR AROM. IMAGE AVAILABLE: No   REPEATED MOTION FLEXION/EXTENSION:      All UE strength is > or = to 4/5 and symmetrical bilaterally.     CLASSIFICATION:   LUMAR:    CERVICAL:       Physical Therapy Plan of Care    KG:MWNUU Loretta Si, MD  Referring Provider: Arlice Colt  Diagnosis: back pain    Assessment/Objective Findings: Patient is a 48 year old female who reports to physical therapy with back pain. Pt reports she has low back pain and upper back pain but the thoracic spine region is of primary concern secondary to worsening symptoms in the past 6 months. Pt's current impairments include decreased mobility in the thoracic spine and flattened thoracic spine. These impairments are contributing to difficulty standing, walking, lifting and bending. This is limiting the patient's ability to work and perform ADLS   Short Term Goal:  1. Pt will have decreased average pain from a 6/10 to less than 4/10 within 2 weeks.   2. Pt will demonstrate improve thoracic mobility to PA mobilization within 2 weeks.   3.  Pt will be independent with HEP within 2 weeks.   4. Pt will demonstrate proper body mechanics for bending and lifting within 2 weeks.   Long  Term Goal:   1. Pt will report improved ability to lift with min pain within 4 weeks.   2. Pt will report ability to perform ADLS without difficulty within 4 weeks.   3. Pt will be able to comb her hair without pain within 4 weeks.     Treatment Plan: ** Stretching/ROM Exercise  ** Therapeutic Exercise  ** Home Exercise Program  ** Joint Mobilization  ** Hot/Cold Rx  ** Manual Traction  ** Functional Activities  ** Patient Education    Recommend Physical Therapy be continued 1-2 times per week for 4 weeks.  The rehabilitation potential for this patient is good    Patient is agreeable to treatment plan and is aware of attendance policy.    Loretta Martin, DPT

## 2009-05-28 ENCOUNTER — Telehealth (HOSPITAL_BASED_OUTPATIENT_CLINIC_OR_DEPARTMENT_OTHER): Payer: Self-pay | Admitting: Internal Medicine

## 2009-05-28 ENCOUNTER — Ambulatory Visit (HOSPITAL_BASED_OUTPATIENT_CLINIC_OR_DEPARTMENT_OTHER): Payer: No Typology Code available for payment source | Admitting: Internal Medicine

## 2009-05-28 ENCOUNTER — Encounter (HOSPITAL_BASED_OUTPATIENT_CLINIC_OR_DEPARTMENT_OTHER): Payer: Self-pay | Admitting: Internal Medicine

## 2009-05-28 VITALS — BP 116/64 | HR 110 | Temp 99.2°F | Wt 132.0 lb

## 2009-05-28 DIAGNOSIS — F329 Major depressive disorder, single episode, unspecified: Principal | ICD-10-CM

## 2009-05-28 DIAGNOSIS — N959 Unspecified menopausal and perimenopausal disorder: Secondary | ICD-10-CM

## 2009-05-28 DIAGNOSIS — F3289 Other specified depressive episodes: Secondary | ICD-10-CM

## 2009-05-28 MED ORDER — VENLAFAXINE HCL 50 MG PO TABS
ORAL_TABLET | ORAL | Status: DC
Start: 2009-05-28 — End: 2009-06-05

## 2009-05-28 MED ORDER — RANITIDINE HCL 150 MG PO TABS
ORAL_TABLET | ORAL | Status: AC
Start: 2009-05-28 — End: 2009-11-28

## 2009-05-28 NOTE — Progress Notes (Signed)
Loretta Martin is a 48 year old female who presents with multiple issues of concern.  She has recently seen gynecologist, and wants to start on a medicine that a friend of hers has been on, abnd which she thinks will help with menopause.  She also reports having some stress and anxiety.  See PHQ-9  Believes that the medicine she was encouraged to take was "zantac"    Does have chronic cough and stomach pains    OBJECTIVE:  Alert pleasant talkative.  Well groomed.  Good eye contact.  No thought blocking    Recent GYNE exam dione    311 DEPRESSIVE DISORDER NEC  (primary encounter diagnosis)  Comment: given mood disorder and anxiety, may do well with effexor  Plan: VENLAFAXINE HCL 50 MG OR TABS    627.9B Menopausal Disorder  Comment: as above

## 2009-05-28 NOTE — Telephone Encounter (Signed)
Faxed pa request from [facility] for Venlafaxine HCL 50 mg tablet.  Please let me know if I should start a PA?

## 2009-05-29 NOTE — Progress Notes (Addendum)
agree with plan of care

## 2009-06-02 ENCOUNTER — Ambulatory Visit (HOSPITAL_BASED_OUTPATIENT_CLINIC_OR_DEPARTMENT_OTHER): Payer: No Typology Code available for payment source | Admitting: Gastroenterology

## 2009-06-02 VITALS — BP 86/54 | Wt 129.0 lb

## 2009-06-02 DIAGNOSIS — F32A Depression, unspecified: Secondary | ICD-10-CM

## 2009-06-02 DIAGNOSIS — F329 Major depressive disorder, single episode, unspecified: Secondary | ICD-10-CM

## 2009-06-02 DIAGNOSIS — Z1211 Encounter for screening for malignant neoplasm of colon: Secondary | ICD-10-CM

## 2009-06-02 LAB — MED SPEC CLINIC NOTE

## 2009-06-02 MED ORDER — COLYTE WITH FLAVOR PACKS 240 G PO SOLR
ORAL | Status: DC
Start: 2009-06-02 — End: 2009-07-09

## 2009-06-02 NOTE — Progress Notes (Signed)
Primary note dictated through E-Scription.  Note filed in Chart Review under the "Other" Tab, as a Medical Specialties Note.

## 2009-06-02 NOTE — Progress Notes (Addendum)
Patient educated about colon prep with Colyte. Instructions given both verbal and written. Patient verbalized understanding.

## 2009-06-05 ENCOUNTER — Telehealth (HOSPITAL_BASED_OUTPATIENT_CLINIC_OR_DEPARTMENT_OTHER): Payer: Self-pay

## 2009-06-05 DIAGNOSIS — M549 Dorsalgia, unspecified: Secondary | ICD-10-CM

## 2009-06-05 DIAGNOSIS — F3289 Other specified depressive episodes: Secondary | ICD-10-CM

## 2009-06-05 DIAGNOSIS — F329 Major depressive disorder, single episode, unspecified: Principal | ICD-10-CM

## 2009-06-05 MED ORDER — PROZAC 20 MG PO CAPS
ORAL_CAPSULE | ORAL | Status: DC
Start: 2009-06-05 — End: 2009-12-22

## 2009-06-05 MED ORDER — VICODIN 5-500 MG PO TABS
ORAL_TABLET | ORAL | Status: AC
Start: 2009-06-05 — End: 2009-07-05

## 2009-06-05 NOTE — Telephone Encounter (Signed)
Back pain started last night - the pain is "so bad" . Has not lost control of bowel or bladder. Needs   Denies numbness or tingling in extremities. Lower back pain - long standing.  Reports needs Prozac? Not on Epic . Offered appointment Friday refused due to work. Requesting prescriptions and reports has discussed all with Dr.Green at previous appointments. To provider to review and advise. Kearney Hard, RN

## 2009-06-05 NOTE — Telephone Encounter (Signed)
Left message for pt to call triage line

## 2009-06-05 NOTE — Telephone Encounter (Signed)
Message copied by Ledell Peoples on Thu Jun 05, 2009 10:04 AM  ------       Message from: Silver Huguenin       Created: Thu Jun 05, 2009  9:38 AM       Regarding: back pain       Contact: [phone]         Genevie Elman is a 48 year old old female.              Patient's PCP: Arlice Colt, MD              In case we get disconnected what is the best number to reach you at today:       Home Phone [phone] (home)              Person calling:       Patient (self)              How can I help you today:        Other Pt is calling asking for a Prozac, pt states she has taken this medication in the past and is having a lot of back pain and she would like some Prozac for her depression.              Patient's language of care: [place]              Would you like an interpreter when the nurse calls you back?       NO

## 2009-06-05 NOTE — Telephone Encounter (Signed)
Message copied by Sharlyne Pacas on Thu Jun 05, 2009  9:59 AM  ------       Message from: Silver Huguenin       Created: Thu Jun 05, 2009  9:38 AM       Regarding: back pain       Contact: [phone]         Loretta Martin is a 48 year old old female.              Patient's PCP: Arlice Colt, MD              In case we get disconnected what is the best number to reach you at today:       Home Phone [phone] (home)              Person calling:       Patient (self)              How can I help you today:        Other Pt is calling asking for a Prozac, pt states she has taken this medication in the past and is having a lot of back pain and she would like some Prozac for her depression.              Patient's language of care: [place]              Would you like an interpreter when the nurse calls you back?       NO

## 2009-06-05 NOTE — Telephone Encounter (Signed)
orderd venlafaxine, but insurnace would not cover.  reorderd prozac and will fax in some vicodin for acute back pain

## 2009-06-05 NOTE — Telephone Encounter (Signed)
Call back to patient, lm to call rn x [phone]

## 2009-06-10 ENCOUNTER — Ambulatory Visit (HOSPITAL_BASED_OUTPATIENT_CLINIC_OR_DEPARTMENT_OTHER): Payer: No Typology Code available for payment source | Admitting: Internal Medicine

## 2009-06-11 ENCOUNTER — Ambulatory Visit (HOSPITAL_BASED_OUTPATIENT_CLINIC_OR_DEPARTMENT_OTHER): Payer: No Typology Code available for payment source

## 2009-06-11 DIAGNOSIS — M549 Dorsalgia, unspecified: Secondary | ICD-10-CM

## 2009-06-11 NOTE — Patient Instructions (Addendum)
Rehabilitation Treatment Flowsheet    Precautions:    Date: 05/27/09 06/11/09        Initials: DL KW        Visit #: 1 2        Time: 60 45        HEP X         Treatment(s)                 IE Manual Lumbar Traction 5 min               manipulation to T5 LTR 5x10 sec               Thoracic extension over FR Piriformis stretch 3x30 sec                SKTC 3x30 sec                TrA contraction 10x 5 sec                TrA OH shld ext 20x                TrA alt pec flys 20x                TrA squats 2x10                MHP 10 min

## 2009-06-11 NOTE — Progress Notes (Signed)
S: Pt stated pain level 5/10.  O: Refer to Rehabilitation Treatment Flowsheet  A: Pt tolerated tx well. Pt educated on TrA contraction for lumbar stabilization. Pt seen with decreased piriformis flexibility.   P:Cont per PT POC.

## 2009-06-13 ENCOUNTER — Ambulatory Visit (HOSPITAL_BASED_OUTPATIENT_CLINIC_OR_DEPARTMENT_OTHER): Payer: No Typology Code available for payment source

## 2009-06-13 MED ORDER — FLEXERIL 10 MG PO TABS
ORAL_TABLET | ORAL | Status: AC
Start: 2009-06-13 — End: 2009-07-13

## 2009-06-13 NOTE — Telephone Encounter (Signed)
Call to patient - message left on voice mail to call clinic  And speak with nurses. Marquesa Rath, RN

## 2009-06-13 NOTE — Telephone Encounter (Signed)
Call to patient  - advised of Dr.Green's message - agrees to try fluoxetine. Does agree to try Flexeril. To provider to place escribe order. Kearney Hard, RN

## 2009-06-13 NOTE — Telephone Encounter (Signed)
Forward to provider to advise patient has allergy listed for Vicodin,nausea and vomitting

## 2009-06-13 NOTE — Telephone Encounter (Signed)
Message copied by Lillia Dallas on Fri Jun 13, 2009  1:51 PM  ------       Message from: Riccardo Dubin       Created: Fri Jun 13, 2009  1:41 PM       Regarding: Returning nurses call       Contact: [phone]         Loretta Martin is a 48 year old old female.              Patient's PCP: Arlice Colt, MD              In case we get disconnected what is the best number to reach you at today:       Home Phone [phone] (home)              Person calling:       Patient (self)              How can I help you today:        Patient is returning nurses call              Patient's language of care: [place]              Would you like an interpreter when the nurse calls you back?       YES  [place]

## 2009-06-13 NOTE — Telephone Encounter (Signed)
Message copied by Lillia Dallas on Fri Jun 13, 2009  3:27 PM  ------       Message from: Silver Huguenin       Created: Fri Jun 13, 2009  3:02 PM       Regarding: pt call       Contact: [phone]         Loretta Martin is a 48 year old old female.              Patient's PCP: Arlice Colt, MD              In case we get disconnected what is the best number to reach you at today:       Home Phone [phone] (home)              Person calling:       Patient (self)              How can I help you today:        Other Pt is calling asking to speak with the RN's, I let the patient know the RN's sent a message to Dr Chilton Si and were waiting to hear back. I assured the patient the RN's will call her back as soon as they get a response from Dr Chilton Si.              Patient's language of care: [place]

## 2009-06-13 NOTE — Telephone Encounter (Signed)
The "allergy" to vicodin was nausea and vomiting which may happen again, but would likely happen with any narcotic.  The fluoxetine she should start and if she wants to retry flexeril that could be called in as well.  BG

## 2009-06-16 ENCOUNTER — Ambulatory Visit (HOSPITAL_BASED_OUTPATIENT_CLINIC_OR_DEPARTMENT_OTHER): Payer: No Typology Code available for payment source

## 2009-06-16 DIAGNOSIS — M549 Dorsalgia, unspecified: Secondary | ICD-10-CM

## 2009-06-16 NOTE — Progress Notes (Signed)
S: Pt stated "My lower back feels much better, its mostly my upper back that hurts."  O: Refer to Rehabilitation Treatment Flowsheet  A: Focused treatment on thoracic stabilization program with TrA contraction for lower back. Pt seen with proper TrA contraction. Added in FT rows and pull downs.   P:Cont per PT POC.

## 2009-06-16 NOTE — Patient Instructions (Signed)
Rehabilitation Treatment Flowsheet    Precautions:    Date: 05/27/09 06/11/09 06/16/09       Initials: DL KW KW       Visit #: 1 2 3        Time: 60 45 40       HEP X         Treatment(s)  5/10 3-4/10              IE Manual Lumbar Traction 5 min x              manipulation to T5 LTR 5x10 sec x              Thoracic extension over FR Piriformis stretch 3x30 sec x               SKTC 3x30 sec Thoracic extension over FR               TrA contraction 10x 5 sec x               TrA OH shld ext 20x 1# 2x20               TrA alt pec flys 20x 1# 2x20               TrA squats 2x10 x               MHP 10 min           FT pull down TrA 65# 2x20          FT TrA Rows 50# 2x20

## 2009-06-18 ENCOUNTER — Ambulatory Visit (HOSPITAL_BASED_OUTPATIENT_CLINIC_OR_DEPARTMENT_OTHER): Payer: No Typology Code available for payment source

## 2009-06-18 DIAGNOSIS — M549 Dorsalgia, unspecified: Secondary | ICD-10-CM

## 2009-06-18 NOTE — Progress Notes (Signed)
See previous weekly note. Pt demonstrates posterior rotation on the right side of the rib cage therefore performed PA mobs to ribs 3-6.  Pt demonstrated improved PA mobility of the thoracic spine following mobs and thoracic extension. Tolerated exercises well. Cont per POC.

## 2009-06-18 NOTE — Patient Instructions (Addendum)
Rehabilitation Treatment Flowsheet    Precautions:    Date: 05/27/09 06/11/09 06/16/09 06/18/09      Initials: DL KW KW DL      Visit #: 1 2 3 4       Time: 60 45 40 40      HEP X         Treatment(s)  5/10 3-4/10 4/10             IE Manual Lumbar Traction 5 min x PA mobs to thoracic T1-T6 spine grade 4              manipulation to T5 LTR 5x10 sec x PA mobs to right ribs 3-6, grade 3              Thoracic extension over FR Piriformis stretch 3x30 sec x               SKTC 3x30 sec Thoracic extension over FR X              TrA contraction 10x 5 sec x               TrA OH shld ext 20x 1# 2x20 x              TrA alt pec flys 20x 1# 2x20 x              TrA squats 2x10 x               MHP 10 min  Seated upper trunk rotation FT 35# x15 each way          FT pull down TrA 65# 2x20 x         FT TrA Rows 50# 2x20 x

## 2009-06-25 ENCOUNTER — Ambulatory Visit (HOSPITAL_BASED_OUTPATIENT_CLINIC_OR_DEPARTMENT_OTHER): Payer: No Typology Code available for payment source

## 2009-07-02 ENCOUNTER — Ambulatory Visit (HOSPITAL_BASED_OUTPATIENT_CLINIC_OR_DEPARTMENT_OTHER): Payer: No Typology Code available for payment source

## 2009-07-09 ENCOUNTER — Encounter (HOSPITAL_BASED_OUTPATIENT_CLINIC_OR_DEPARTMENT_OTHER): Payer: Self-pay | Admitting: Internal Medicine

## 2009-07-09 ENCOUNTER — Ambulatory Visit (HOSPITAL_BASED_OUTPATIENT_CLINIC_OR_DEPARTMENT_OTHER): Payer: No Typology Code available for payment source | Admitting: Internal Medicine

## 2009-07-09 ENCOUNTER — Ambulatory Visit (HOSPITAL_BASED_OUTPATIENT_CLINIC_OR_DEPARTMENT_OTHER): Payer: No Typology Code available for payment source

## 2009-07-09 VITALS — BP 124/72 | HR 96 | Temp 98.2°F | Wt 128.0 lb

## 2009-07-09 DIAGNOSIS — Z23 Encounter for immunization: Secondary | ICD-10-CM

## 2009-07-09 DIAGNOSIS — F3289 Other specified depressive episodes: Secondary | ICD-10-CM

## 2009-07-09 DIAGNOSIS — R920 Mammographic microcalcification found on diagnostic imaging of breast: Secondary | ICD-10-CM

## 2009-07-09 DIAGNOSIS — F329 Major depressive disorder, single episode, unspecified: Principal | ICD-10-CM

## 2009-07-09 DIAGNOSIS — N951 Menopausal and female climacteric states: Secondary | ICD-10-CM

## 2009-07-09 DIAGNOSIS — R05 Cough: Secondary | ICD-10-CM

## 2009-07-09 DIAGNOSIS — R059 Cough, unspecified: Secondary | ICD-10-CM

## 2009-07-09 MED ORDER — FLONASE 50 MCG/ACT NA SUSP
NASAL | Status: DC
Start: 2009-07-09 — End: 2010-07-16

## 2009-07-09 NOTE — Progress Notes (Signed)
Pt requesting Flu Vaccine . Confirmed patient's name and date of birth.  Pt denies allergies to this vaccine.  Pt denies allergies to egg or egg products.  Pt denies allergy to contact lens solution.  Pt denies pregnancy.    Pt denies adverse effects from previous administration of this medication. Pt denies history of Guillain-Barre' syndrome.  Pt denies moderate/severe illness at this time.  Risks and benefits of Flu Vaccine reviewed with pt.   VIS for Flu Vaccine dated 05-13-2009 offered and reviewed with pt.    Flu Vaccine 0.5.ml IM administered. Tolerated well by patient.  Patient denies adverse effects from injection at this time.   Patient encouraged to utilize arm and not favor it.  Patient informed may  take pain reliever of choice and to apply ice for discomfort if necessary.  Patient will call with any questions or concerns.  Please refer to Imm./Inj. section for administration site, lot # and exp. date.  Patient was encouraged to wait for twenty minutes in the lobby.

## 2009-07-09 NOTE — Progress Notes (Signed)
Loretta Martin is a 48 year old female who presents for follow up of anxiety disorder and possible depressive disorder, although she did not have postitve PHQ 9.  She now states on fluoxetine she is sleeping better and feeling better over all.  She no longer wants effexor, and she does state that she was mistaken about which medicine she wanted at last visit.  She has been using zantac for periodic GI upset, but generally does not feel she needs it  New concern is having hot flashes.  Still having periods, although none in the last 2 months.  Questions about need for hormone replacement.  Educated re pros and cons of this as well as typical course for menopausal sx, and use of OTC meds.  Requesting refill of flonase which helps with cough.  Using OTC decongestant spray which "made things worse after a couple of days."  Not using nasal saline    ROS: No TIA's or unusual headaches, no dysphagia.  No prolonged cough. No dyspnea or chest pain on exertion.  No abdominal pain, change in bowel habits, black or bloody stools.  No urinary tract symptoms.      OBJECTIVE:  BP 124/72   Pulse 96   Temp(Src) 98.2 F (36.8 C) (Oral)   Wt 128 lb (58.06 kg)   SpO2 98%   LMP 04/29/2009  .pleasant NAD    311 DEPRESSIVE DISORDER NEC  (primary encounter diagnosis)  Comment: doing well. No change in med    786.2 Cough  Plan: FLONASE 50 MCG/ACT NA SUSP        Discussed use of saline    793.81 Mammographic microcalcification  Comment: has had updated mammogram, but HM field is not corect.  Sent to Epic IT support to fix    V04.81 Need for prophylactic vaccination and inoculation against influenza  Plan: INFLUENZA VIRUS VACCINE SPLIT VIRUS 3 YEARS +         IM, IMMUNIZATION ADMIN SINGLE, RN    627.2AN Perimenopause  Comment: counseled re typical course and triggers for hormone replacement     > 50 % of this visit spent in counselling  Face to face 25 minutes  I have reviewed the past medical, surgical, social and family history and  updated these sections of EpicCare as relevant. All interim labs, test results, and consult notes were reviewed and discussed with [name]. Medications were reconciled during this visit and a current medication list was given to the patient at the end of the visit.

## 2009-07-24 ENCOUNTER — Telehealth (HOSPITAL_BASED_OUTPATIENT_CLINIC_OR_DEPARTMENT_OTHER): Payer: Self-pay | Admitting: "Women's Health Care

## 2009-07-24 ENCOUNTER — Ambulatory Visit (HOSPITAL_BASED_OUTPATIENT_CLINIC_OR_DEPARTMENT_OTHER): Payer: No Typology Code available for payment source | Admitting: Obstetrics & Gynecology

## 2009-07-24 VITALS — BP 110/80 | Wt 127.5 lb

## 2009-07-24 DIAGNOSIS — IMO0001 Reserved for inherently not codable concepts without codable children: Secondary | ICD-10-CM

## 2009-07-24 DIAGNOSIS — N949 Unspecified condition associated with female genital organs and menstrual cycle: Secondary | ICD-10-CM

## 2009-07-24 LAB — URINE DIP (POINT OF CARE)
BILIRUBIN, URINE: NEGATIVE (ref 0–0)
GLUCOSE, URINE: NEGATIVE mg/dl (ref 0–0)
KETONE, URINE: NEGATIVE mg/dl (ref 0–0)
LEUKOCYTE ESTERASE: NEGATIVE (ref 0–0)
NITRITE, URINE: NEGATIVE
OCCULT BLOOD, URINE: NEGATIVE (ref 0–0)
PH URINE: 6 (ref 5.0–8.0)
PROTEIN, URINE: NEGATIVE mg/dl (ref 0–15)
SPECIFIC GRAVITY URINE: 1.015 (ref 1.003–1.030)
UROBILINOGEN URINE: 0.2 mg/dl (ref 0.2–1.0)

## 2009-07-24 MED ORDER — DIFLUCAN 150 MG PO TABS
ORAL_TABLET | ORAL | Status: AC
Start: 2009-07-24 — End: 2009-08-23

## 2009-07-24 NOTE — Telephone Encounter (Signed)
TC to pt who states that she has noticed for the last couple of days a white thick vaginal discharge, slight itching in vagina, vagina very irritated, urinating ever hour, urgency and her abd looks swollen. She denies any burning on urination, fever or lower back pain.    Appt given today with Dr. Lynnell Chad for 3:30pm

## 2009-07-24 NOTE — Telephone Encounter (Signed)
Message copied by Lorri Frederick on Thu Jul 24, 2009 11:34 AM  ------       Message from: Robet Leu       Created: Thu Jul 24, 2009 11:09 AM       Regarding: Pt is urinating excessively         Virgina Deakins [id number], 48 year old, female, Telephone Information:       Home Phone      [phone]       Work Phone      Not on file.       Mobile          [phone]                     CALL BACK NUMBER: home       Cell phone:        Other phone:              Available times:              Patient's language of care: English              Patient needs a [place] interpreter.              Patient's PCP: Arlice Colt, MD              Person calling on behalf of patient: Patient (self)              Calls today with GYN problem . Pt is urinating every hours and complaining of burning sensation on vag.

## 2009-07-24 NOTE — Progress Notes (Signed)
For 2 months urinating q hour, drinking more coffee with milk  No glucose in urine  C/o vaginal burning with white dc for a couple of days  Thinks is from frequent wiping for her urinary frequency  Does not seem like a yeast infection  No douching, using OTC vaginal creams or sprays  No harsh detergents  Not sexually active  Having pelvic and back pain for 2-3 days  Not taking ocps for last couple of months  LMP July 27th  u dip neg    Gen:  NAD  Pelvic:  NAFG  Spec: normal appearing cervix, ? Yeast   Bimanual small nontender uterus  Ext:  Normal    A:  Pelvic pain and irritation, does not seem like atrophy , likely more c/w low grade yeast  P:  Diflucan       Urine culture      Voiding diary, cut down on caffeine       Ref to urology      Pelvic US      Pap due 2/11    20/25 min counseling visit  Sherral Hammers

## 2009-07-25 LAB — URINE CULTURE/COLONY COUNT

## 2009-07-28 ENCOUNTER — Telehealth (HOSPITAL_BASED_OUTPATIENT_CLINIC_OR_DEPARTMENT_OTHER): Payer: Self-pay

## 2009-07-28 ENCOUNTER — Ambulatory Visit: Payer: Self-pay | Admitting: Obstetrics & Gynecology

## 2009-07-28 LAB — US PELVIC NON-PREGNANT

## 2009-07-28 LAB — US TRANSVAGINAL NON-OB

## 2009-07-28 LAB — US 3D RENDERING WO DEDICATED WORKSTATION

## 2009-07-28 NOTE — Telephone Encounter (Signed)
TC pt via [place] portuguese interp at ext [phone]    Pt states she was seen by Dr.Franco pelvic pain and irritation.  States she took diflucan which did not improve vaginal discharge which is clear to yellow.  States her abd is still swollen - has back and lower abd pain.  States she made appt for urology as advised by Dr.Franco - states appt is scheduled for March 2011.  Pt wants to know what she should do now.  States she has appt for u/s today at [facility] at 2:30pm.    Forward to Dr.Farhat for review/disposition

## 2009-07-28 NOTE — Telephone Encounter (Signed)
Per Dr.Farhat pt offered appt with Dr.Franco tomorrow.  Pt requests appt first thing is the morning    Pt advised to come in tomorrow morning to see Dr.Franco at 8:45am - appt scheduled by Myra    Pt agrees with above and states she will keep appt    Forward to Dr.Franco [facility]

## 2009-07-28 NOTE — Telephone Encounter (Signed)
Add with Dr. Lynnell Chad tomorrow for reevaluation

## 2009-07-28 NOTE — Telephone Encounter (Signed)
Message copied by Ardeen Fillers on Mon Jul 28, 2009 10:18 AM  ------       Message from: Tresa Garter       Created: Mon Jul 28, 2009  9:21 AM       Regarding: Loretta Martin         Loretta Martin [id number], 48 year old, female, Telephone Information:       Home Phone      [phone]       Work Phone      Not on file.       Mobile          [phone]                     CALL BACK NUMBER: [phone]       Cell phone:        Other phone:              Available times:              Patient's language of care: English              Patient needs a [place] interpreter.              Patient's PCP: Arlice Colt, MD              Person calling on behalf of patient: Patient (self)              Calls today with questions and concerns.Pt stated she saw Dr Lynnell Chad Thursday 07/24/09 for swollen abdomin and vag infect. Patient still having same symptoms.

## 2009-07-29 ENCOUNTER — Ambulatory Visit (HOSPITAL_BASED_OUTPATIENT_CLINIC_OR_DEPARTMENT_OTHER): Payer: No Typology Code available for payment source | Admitting: Obstetrics & Gynecology

## 2009-07-29 VITALS — BP 100/70 | Wt 127.0 lb

## 2009-07-29 DIAGNOSIS — N83209 Unspecified ovarian cyst, unspecified side: Secondary | ICD-10-CM

## 2009-07-29 LAB — URINE DIP (POINT OF CARE)
BILIRUBIN, URINE: NEGATIVE (ref 0–0)
GLUCOSE, URINE: NEGATIVE mg/dl (ref 0–0)
KETONE, URINE: NEGATIVE mg/dl (ref 0–0)
LEUKOCYTE ESTERASE: NEGATIVE (ref 0–0)
NITRITE, URINE: NEGATIVE
PH URINE: 6 (ref 5.0–8.0)
PROTEIN, URINE: NEGATIVE mg/dl (ref 0–15)
SPECIFIC GRAVITY URINE: 1.015 (ref 1.003–1.030)
UROBILINOGEN URINE: 0.2 mg/dl (ref 0.2–1.0)

## 2009-07-29 MED ORDER — PREMARIN 0.625 MG/GM VA CREA
TOPICAL_CREAM | VAGINAL | Status: AC
Start: 2009-07-29 — End: 2010-07-29

## 2009-07-29 NOTE — Progress Notes (Signed)
Here for follow up  Having swollen abdomen  Had [place] with 1.8 cm simple ovarian cyst  Had some burning with vaginal [place]  Not sexually active    Gen:  NAD  Pelvic:  NAFG  Normal appearing vaginal dc on spec    A:  Small ovarian cyst  P: rec repeat US      ? Atrophy, premarin       RTC 8 weeks    Sherral Hammers

## 2009-07-30 ENCOUNTER — Ambulatory Visit (HOSPITAL_BASED_OUTPATIENT_CLINIC_OR_DEPARTMENT_OTHER): Payer: Self-pay | Admitting: Gastroenterology

## 2009-07-30 LAB — GI OPERATIVE NOTE

## 2009-08-06 ENCOUNTER — Telehealth (HOSPITAL_BASED_OUTPATIENT_CLINIC_OR_DEPARTMENT_OTHER): Payer: Self-pay | Admitting: Ambulatory Care

## 2009-08-06 NOTE — Telephone Encounter (Signed)
Left message for pt to call [facility] and ask to speak with Britta Mccreedy

## 2009-08-21 ENCOUNTER — Other Ambulatory Visit (HOSPITAL_BASED_OUTPATIENT_CLINIC_OR_DEPARTMENT_OTHER): Payer: Self-pay | Admitting: Internal Medicine

## 2009-08-21 DIAGNOSIS — R059 Cough, unspecified: Secondary | ICD-10-CM

## 2009-08-21 DIAGNOSIS — R05 Cough: Principal | ICD-10-CM

## 2009-08-21 NOTE — Telephone Encounter (Signed)
Zarayah Lanting is a 48 year old female calling to request a refill of proair inhaler  Other Med Adult:  Most Recent BP Reading(s)  07/29/2009 : 100/70        Cholesterol (mg/dl)   Date     Date  Value    08/10/2005  172    ----------    LDL (mg/dl)   Date     Date  Value    08/10/2005  126*   ----------    HDL (mg/dl)   Date     Date  Value    08/10/2005  46    ----------    TRIGLYCERIDES (mg/dl)   Date     Date  Value    08/10/2005  79    ----------        THYROID SCREEN TSH (uIU/mL)   Date     Date  Value    10/31/2008  2.83    ----------        THYROID STIM HORMONE (uIU/ml)   Date     Date  Value    08/10/2005  3.36    ----------      No results found for this basename: [id number]:1        INR (no units)   Date     Date  Value    05/12/2009  1.0*   ----------       Documented patient preferred [facility]ies:  [facility] [place] [phone] Fax: [phone]

## 2009-08-21 NOTE — Telephone Encounter (Signed)
[facility]    Person calling on behalf of patient: Patient (self)    May list multiple medications in this section  Medicine Name:   PROAIR HFA 108 (90 BASE) MCG/ACT IN AERS (ALBUTEROL HFA)  Dosage:   Frequency (how many pills, how many times a day):   Number of pills left:   Documented patient preferred pharmacies:  [facility] [place] [phone] Fax: [phone]    CALL BACK NUMBER: [phone]  Cell phone:   Other phone:    Available times:    Patient's language of care: English    Patient does not need an interpreter.

## 2009-08-22 ENCOUNTER — Telehealth (HOSPITAL_BASED_OUTPATIENT_CLINIC_OR_DEPARTMENT_OTHER): Payer: Self-pay | Admitting: Ambulatory Care

## 2009-08-22 DIAGNOSIS — R05 Cough: Principal | ICD-10-CM

## 2009-08-22 DIAGNOSIS — R059 Cough, unspecified: Secondary | ICD-10-CM

## 2009-08-22 MED ORDER — ALBUTEROL SULFATE HFA 108 (90 BASE) MCG/ACT IN AERS
INHALATION_SPRAY | RESPIRATORY_TRACT | Status: DC
Start: 2009-08-22 — End: 2011-02-04

## 2009-08-22 MED ORDER — ALBUTEROL SULFATE HFA 108 (90 BASE) MCG/ACT IN AERS
INHALATION_SPRAY | RESPIRATORY_TRACT | Status: DC
Start: 2009-08-21 — End: 2009-08-22

## 2009-08-22 NOTE — Telephone Encounter (Signed)
Message copied by Windell Moulding on Fri Aug 22, 2009  2:42 PM  ------       Message from: Riccardo Dubin       Created: Fri Aug 22, 2009  2:31 PM       Regarding: Questions on prescription sent       Contact: [phone]         Jillienne Egner is a 48 year old old female.              Patient's PCP: Arlice Colt, MD              In case we get disconnected what is the best number to reach you at today:       [phone]              Person calling:       Toni Amend from [facility]              How can I help you today:        Has questions about prescription sent for PRO AIR INHALER. Asked to call pharmacy back.              Patient's language of care: [place]              Would you like an interpreter when the nurse calls you back?       NO

## 2009-08-22 NOTE — Telephone Encounter (Signed)
Proair needs to be ordered with more detailed directions- last written by Dr.Green as max 12 puffs per day, ok'd that .    Dr.Green, please update in med list thanks

## 2009-08-22 NOTE — Telephone Encounter (Signed)
Calling again for refill of PROAIR. She needs it for today.

## 2009-09-01 ENCOUNTER — Ambulatory Visit (HOSPITAL_BASED_OUTPATIENT_CLINIC_OR_DEPARTMENT_OTHER): Payer: Self-pay | Admitting: Gastroenterology

## 2009-09-08 ENCOUNTER — Ambulatory Visit: Payer: Self-pay | Admitting: Obstetrics & Gynecology

## 2009-09-23 ENCOUNTER — Ambulatory Visit (HOSPITAL_BASED_OUTPATIENT_CLINIC_OR_DEPARTMENT_OTHER): Payer: No Typology Code available for payment source | Admitting: Obstetrics & Gynecology

## 2009-10-08 ENCOUNTER — Encounter (HOSPITAL_BASED_OUTPATIENT_CLINIC_OR_DEPARTMENT_OTHER): Payer: Self-pay | Admitting: Internal Medicine

## 2009-10-08 ENCOUNTER — Ambulatory Visit (HOSPITAL_BASED_OUTPATIENT_CLINIC_OR_DEPARTMENT_OTHER): Payer: No Typology Code available for payment source | Admitting: Internal Medicine

## 2009-10-08 VITALS — BP 110/76 | HR 96 | Temp 98.7°F | Wt 133.0 lb

## 2009-10-08 DIAGNOSIS — R05 Cough: Principal | ICD-10-CM

## 2009-10-08 DIAGNOSIS — R51 Headache: Secondary | ICD-10-CM

## 2009-10-08 DIAGNOSIS — R059 Cough, unspecified: Secondary | ICD-10-CM

## 2009-10-08 DIAGNOSIS — F329 Major depressive disorder, single episode, unspecified: Secondary | ICD-10-CM

## 2009-10-08 DIAGNOSIS — F3289 Other specified depressive episodes: Secondary | ICD-10-CM

## 2009-10-08 MED ORDER — CETIRIZINE HCL 10 MG PO CAPS
ORAL_CAPSULE | ORAL | Status: AC
Start: 2009-10-08 — End: 2010-01-06

## 2009-10-08 NOTE — Progress Notes (Signed)
Loretta Martin is a 49 year old female here for multiple issues.  Chief concern is headache at forehead. Present for 5 days.  No fevers or chills, and no visual changes.  Does complain of "runny nose all the time."  Bloody nose on multiple occasions.  States she is concerned because she has had multiple episodes when she had a Cough, felt tired with SOB, and then the headache the starts.  She stopped taking ranitidine after realizing this was not the psych med she wanted (she had specifically requested this at a prior visit).  No GI sx. No hx of GERd.  Depression and anxiety are a little better.  See PHQ-9    Does not have a humidifier.  Explained why this may help sx, and why in the winter she gets these sx.    OBJECTIVE:  BP 110/76   Pulse 96   Temp(Src) 98.7 F (37.1 C) (Oral)   Wt 133 lb (60.328 kg)   SpO2 98%   LMP 09/04/2009  Ears normal. Throat and pharynx normal. Neck supple. No adenopathy or masses in the neck or supraclavicular regions. Sinuses non tender.  S1 and S2 normal, no murmurs, clicks, gallops or rubs. Regular rate and rhythm. Chest is clear; no wheezes or rales. No edema or JVD.    786.2 Cough  Comment: likely mild viral URI superimposed on chrnic sinus irritation from dry mucous membranes  Plan: Cetirizine HCl 10 MG CAPS        Trial of antihistamine to open passages and patient will try humidifying bedroom    784.0 Headache  Comment: as above.  NSAIDS    311 DEPRESSIVE DISORDER NEC  Comment: subjectively beter despite PHQ being slightly worse  Plan. Continue present meds.     > 50 % of this visit spent in counselling  Face to face 25 minutes  I have reviewed the past medical, surgical, social and family history and updated these sections of EpicCare as relevant. All interim labs, test results, and consult notes were reviewed and discussed with Loretta Martin. Medications were reconciled during this visit and a current medication list was given to the patient at the end of the  visit.

## 2009-12-08 ENCOUNTER — Encounter (HOSPITAL_BASED_OUTPATIENT_CLINIC_OR_DEPARTMENT_OTHER): Payer: Self-pay | Admitting: Urology

## 2009-12-08 ENCOUNTER — Ambulatory Visit (HOSPITAL_BASED_OUTPATIENT_CLINIC_OR_DEPARTMENT_OTHER): Payer: No Typology Code available for payment source | Admitting: Urology

## 2009-12-08 VITALS — Temp 98.2°F

## 2009-12-08 DIAGNOSIS — R350 Frequency of micturition: Secondary | ICD-10-CM

## 2009-12-08 LAB — URINE DIP (POINT OF CARE)
BILIRUBIN, URINE: NEGATIVE (ref 0–0)
GLUCOSE, URINE: NEGATIVE mg/dl (ref 0–0)
KETONE, URINE: NEGATIVE mg/dl (ref 0–0)
LEUKOCYTE ESTERASE: NEGATIVE (ref 0–0)
NITRITE, URINE: NEGATIVE
OCCULT BLOOD, URINE: NEGATIVE (ref 0–0)
PH URINE: 5 (ref 5.0–8.0)
PROTEIN, URINE: NEGATIVE mg/dl (ref 0–15)
SPECIFIC GRAVITY URINE: 1.015 (ref 1.003–1.030)
UROBILINOGEN URINE: 0.2 mg/dl (ref 0.2–1.0)

## 2009-12-08 LAB — MEAS POST-VOIDING RESIDUAL URINE&/BLADDER CAP

## 2009-12-08 NOTE — Progress Notes (Signed)
See note below  I agree and have confirmed the findings      Will check UDS

## 2009-12-08 NOTE — Progress Notes (Signed)
Hpi: 49 YO female who presents with ongoing increased urinary frequency and hesitancy.  Patient unable to accurately describe when symptoms (anywhere from 6 months-2 years ago) began but says she just noticed them now.  Urinates once every hour and wakes her up at night.  Denies any excessive fluid intake.   Sometimes has associated suprapubic "belly swelling."   Does have some urinary incontinence with cough, and is sometimes unable to make it to bathroom without some minor incontinence.  Denies any pain during urination, burning during urination.    Denies odorous or excessive discharge.Denies hematuria.  Denies any radiation to back (has LBP at baseline)  Denies any fevers, chills, aches, sweats.      Denies constipation/diarrhea.    Of note patient was seen by PCP for this issue in October 2010, Udip/Ucx were inconclusive.        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 - had negative CXR pp at [facility] referred to TB clinic    Nonspecific abnormal results of thyroid function study 08/31/2004    Comment: increased TSH 4.19 6/00; TSH nl 9/00; 05/04/99 - wnl    Urinary tract infection, site not specified 08/31/2004    Comment: 4/02, 6/02    Depressive disorder, not elsewhere classified 08/31/2004    Comment: 10/00 to Psych on Paxil, self d/c'd; 9/00 head CT: scattered punctate calcifications    Asthma     Presbyopia 04/23/2009    PVD (posterior vitreous detachment) 04/23/2009         Current outpatient prescriptions ordered prior to encounter:  Cetirizine HCl 10 MG CAPS 1 CAPSULE DAILY Disp: 30 capsule Rfl: 2   albuterol (PROAIR HFA) 108 (90 BASE) MCG/ACT AERS 2 puffs every 4 hours as needed for cough Disp: 1 Inhaler Rfl: 5   PREMARIN 0.625 MG/GM VA CREA apply vaginally as directed every other day for 2 weeks  then twice weekly Disp: 1 tube Rfl: 3   FLONASE 50 MCG/ACT NA SUSP 2 puffs in each nostril daily Disp: 1 Bottle Rfl: 11   PROZAC 20 MG OR CAPS 1 CAPSULE EVERY MORNING Disp: 30 capsule Rfl: 5       Review of Patient's Allergies indicates:   Pcn (penicillins)           Comment:Got a urine infection after using   Acetaminophen w/ hy*    Nausea and Vomiting    FH: Mother has diabetes    SH: Last sexual contact one month ago with ex,  Denies any other sexual contacts.   Denies any anal sex or wiping from back to front after defecation.  States she does go to bathroom to urinate during intercourse if needed.    VISIT VITALS: Temp 98.2 F (36.8 C)          Physical Exam   Constitutional: She is oriented to person, place, and time. She appears well-developed and well-nourished. No distress.   HENT:   Head: Normocephalic and atraumatic.   Eyes: Extraocular motions are normal.   Cardiovascular: Normal rate, regular rhythm and normal heart sounds.    Pulmonary/Chest: Effort normal. No respiratory distress. She has no wheezes. She exhibits no tenderness.   Abdominal: Soft. She exhibits no distension. Tenderness is present. She  has no rebound and no guarding.        Mild suprapubic pain to deep palpation   Genitourinary: Vagina normal.        Vaginal mucosa showed no evidence of atrophy.  Good urethral and rectal sphincter tone.  Q tip test showed 5 degree elevation with stress.  No cystocele/rectocele/enterocele.   Neurological: She is alert and oriented to person, place, and time.   Skin: Skin is warm and dry. She is not diaphoretic.       A/P: 49 yo female who presents with ongoing urinary frequency and ?stress incontinence/?URGE incontinence.  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  Will schedule urodynamic study.  -f/u with Dr. Orest Dikes after urodynamic evaluation

## 2009-12-10 ENCOUNTER — Encounter (HOSPITAL_BASED_OUTPATIENT_CLINIC_OR_DEPARTMENT_OTHER): Payer: Self-pay | Admitting: Internal Medicine

## 2009-12-10 ENCOUNTER — Ambulatory Visit (HOSPITAL_BASED_OUTPATIENT_CLINIC_OR_DEPARTMENT_OTHER): Payer: No Typology Code available for payment source | Admitting: Internal Medicine

## 2009-12-10 VITALS — BP 90/60 | HR 96 | Temp 99.3°F | Wt 136.0 lb

## 2009-12-10 DIAGNOSIS — Z01419 Encounter for gynecological examination (general) (routine) without abnormal findings: Secondary | ICD-10-CM

## 2009-12-10 DIAGNOSIS — R040 Epistaxis: Secondary | ICD-10-CM

## 2009-12-10 NOTE — Progress Notes (Signed)
SUBJECTIVE:    Loretta Martin is a 49 year old woman here for annual gyn exam.      Obstetric History   G3  P3  T0  P1  TAB0  SAB0  E0  M0  L3     Comment: C/S X 2 followed by VBAC       CC/HPI: routine screening      Current outpatient prescriptions:  Cetirizine HCl 10 MG CAPS 1 CAPSULE DAILY Disp: 30 capsule Rfl: 2   albuterol (PROAIR HFA) 108 (90 BASE) MCG/ACT AERS 2 puffs every 4 hours as needed for cough Disp: 1 Inhaler Rfl: 5   PREMARIN 0.625 MG/GM VA CREA apply vaginally as directed every other day for 2 weeks then twice weekly Disp: 1 tube Rfl: 3   FLONASE 50 MCG/ACT NA SUSP 2 puffs in each nostril daily Disp: 1 Bottle Rfl: 11   PROZAC 20 MG OR CAPS 1 CAPSULE EVERY MORNING Disp: 30 capsule Rfl: 5       HRT: Premarin cream as directed    ALLERGIES:  Pcn, Acetaminophen W/ Hydrocodone    Menstrual Status:  pre menopausal    LMP: Patient's last menstrual period was 11/02/2009.     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 - had negative CXR pp at [facility] referred to TB clinic    Nonspecific abnormal results of thyroid function study 08/31/2004    Comment: increased TSH 4.19 6/00; TSH nl 9/00; 05/04/99 - wnl    Urinary tract infection, site not specified 08/31/2004    Comment: 4/02, 6/02    Depressive disorder, not elsewhere classified 08/31/2004    Comment: 10/00 to Psych on Paxil, self d/c'd; 9/00 head CT: scattered punctate calcifications    Asthma     Presbyopia 04/23/2009    PVD (posterior vitreous detachment) 04/23/2009           Past  Surgical History    LIG/TRNSXJ FLP TUBE ABDL/VAG APPR UNI/BI 1998    Comment Tubal Ligation    REDUCTION MAMMAPLASTY 2004    Comment abdominoplasty in [place]    OB ANTEPARTUM CARE C DLVR&POSTPARTUM     Comment c/section x 2    EXCISION EXCESSIVE SKIN & SUBQ TISSUE ABDOMEN     BREAST BIOPSY & NEEDLE LOC WIR            Social History   Marital Status: Divorced  Spouse Name: N/A    Years of Education: N/A  Number of Children: 3     Occupational History  housecleaning       Social History Main Topics   Tobacco Use: Quit     Quit date: 08/10/2004    Comment: smoked only for 6 months.    Alcohol Use: Yes    Comment: sporadically    Drug Use: No    Sexually Active: Not Currently  Partner(s): Female    Birth Control/ Protection: Surgical    Comment: men  age 82 q mont, menorrhagia 2006, past history of syphillis and rx , 1st SA age 35, 1 lietime partners, separated again from her husband.     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, he helps with financial support of youngest child                    Family History    Diabetes Mother    Hypertension Mother         ROS:  Endocrine: negative  Genitourinary: negative      PHYSICAL EXAM:  General: A+0x3,NAD  BP 90/60   Pulse 96   Temp(Src) 99.3 F (37.4 C) (Oral)   Wt 136 lb (61.689 kg)   SpO2 97%   LMP 11/02/2009    Breasts: no masses, skin, nipple or axillary changes and nipples everted  Abdomen: no masses or tenderness and soft, non-tender    PELVIC:  External Genitalia: normal architecture, no lesions and no discharge  Vagina: well rugated and no lesions  Vaginal Discharge: normal appearing, moderate and clear  Pelvic supports: normal  Cervix: no lesions  Uterus: anteverted, normal size and non-tender  Adnexa: no masses, nodularity, tenderness  Rectum: normal      ASSESSMENT/PLAN:  V72.31  Routine gynecological examination  (primary encounter diagnosis)  Comment:  Normal GYN exam  Plan: CYTOPATH, C/V, THIN LAYER, OBTAINING SCREEN PAP        SMEAR, HUMAN PAPILLOMAVIRUS CERV/VAG CANC         SCRN,PELV/BREAST EXAM          V76.12B Screening mammogram  Comment:  Plan: ORDER FOR MAMMOGRAM (SCREENING)          784.7 Epistaxis  Comment: At the end of visit, pt voices concerns of having scant bleeding from right nostril in the morning when blowing her nose. Uses Flonase daily. Denies headache, dizziness  Advised pt to use humidifier and hold use of Flonase. To f/u with PCP if symptoms continue or worsening.   Plan:

## 2009-12-17 ENCOUNTER — Encounter (HOSPITAL_BASED_OUTPATIENT_CLINIC_OR_DEPARTMENT_OTHER): Payer: Self-pay | Admitting: Urology

## 2009-12-18 LAB — HUMAN PAPILLOMAVIRUS (HPV): HUMAN PAPILLOMAVIRUS: NEGATIVE

## 2009-12-22 ENCOUNTER — Encounter (HOSPITAL_BASED_OUTPATIENT_CLINIC_OR_DEPARTMENT_OTHER): Payer: Self-pay | Admitting: Internal Medicine

## 2009-12-22 ENCOUNTER — Ambulatory Visit (HOSPITAL_BASED_OUTPATIENT_CLINIC_OR_DEPARTMENT_OTHER): Payer: No Typology Code available for payment source | Admitting: Internal Medicine

## 2009-12-22 VITALS — BP 106/70 | HR 68 | Temp 98.2°F | Ht 62.0 in | Wt 140.0 lb

## 2009-12-22 DIAGNOSIS — R920 Mammographic microcalcification found on diagnostic imaging of breast: Secondary | ICD-10-CM

## 2009-12-22 DIAGNOSIS — F3289 Other specified depressive episodes: Secondary | ICD-10-CM

## 2009-12-22 DIAGNOSIS — R040 Epistaxis: Secondary | ICD-10-CM

## 2009-12-22 DIAGNOSIS — R05 Cough: Secondary | ICD-10-CM

## 2009-12-22 DIAGNOSIS — N952 Postmenopausal atrophic vaginitis: Secondary | ICD-10-CM

## 2009-12-22 DIAGNOSIS — R059 Cough, unspecified: Secondary | ICD-10-CM

## 2009-12-22 DIAGNOSIS — F329 Major depressive disorder, single episode, unspecified: Secondary | ICD-10-CM

## 2009-12-22 MED ORDER — FLUOXETINE HCL 20 MG PO CAPS
40.0000 mg | ORAL_CAPSULE | Freq: Every morning | ORAL | Status: DC
Start: 2009-12-22 — End: 2010-09-15

## 2009-12-22 NOTE — Progress Notes (Signed)
Loretta Martin is a 49 year old female  Who presents for follow up of episodes of epistaxis.  She had been using flonase for seasonal allergies daily and has noticed bleeding at right nostril for months.  only a little bit of blood every day.   No pain, no sneezing or purulent drainage.  Stopped flonase 2 weeks ago on the advice of Caroyn [place].  Seasonal allergies only begin in the late summer.    PRIMARY CARE DEPRESSION FOLLOW-UP    Interpreter needed: No    PHQ-9 Score Today: 10    Patient is reporting depressed mood, irritability, sleep disturbance and decreased motivation  Symptoms have not improved since last visit.    The patient was prescribed medications and is taking them exactly as directed.  The patient is not having any side effects from the medication.  The patient is not in psychotherapy    Suicide Risk:  Low risk = No current thoughts, no major risk factors  Homicide Risk:   Low risk = No current thoughts, no major risk factors    Had mammogram recently with microcalcifications.  Discussed normal biopsy, and plan to have repeat Mammogram in a few months    Had not been using premarin.  inly used 2 times and "problem went away."  Explained use of hormones to maintain vaginal lining.    ROS: No TIA's or unusual headaches, no dysphagia.  No prolonged cough. No dyspnea or chest pain on exertion.  No abdominal pain, change in bowel habits, black or bloody stools.    Exam/ROS: BP 106/70   Pulse 68   Temp(Src) 98.2 F (36.8 C) (Oral)   Ht 5\' 2"  (1.575 m)   Wt 140 lb (63.504 kg)   LMP 11/02/2009  Pleasant NAD  Nasal mucosa pink without rhinorrhea.  Does have ulcer at the right septum anterior with fresh bright red blood, but not actively bleeding.  Alert pleasant, lucid and well groomed    784.7 Epistaxis  (primary encounter diagnosis)  Comment: stop flonase until mid summer.  Use petrolatum to cover ulcer and otherwise leave alone    311 DEPRESSIVE DISORDER NEC  Comment: increase dose as we  discussed  Plan: fluoxetine (PROZAC) 20 MG capsule    793.81 Mammographic microcalcification  Comment: mammogram as above    627.3V Vaginal atrophy  Comment: explained use of medications  Plan: plans to use at least once per week

## 2009-12-25 LAB — CYTOPATH, C/V, THIN LAYER

## 2009-12-26 ENCOUNTER — Encounter (HOSPITAL_BASED_OUTPATIENT_CLINIC_OR_DEPARTMENT_OTHER): Payer: Self-pay

## 2009-12-26 ENCOUNTER — Emergency Department (HOSPITAL_BASED_OUTPATIENT_CLINIC_OR_DEPARTMENT_OTHER)
Admission: RE | Admit: 2009-12-26 | Disposition: A | Discharge status: Home / Self Care | Payer: Self-pay | Source: Emergency Department | Attending: Emergency Medical Services | Admitting: Emergency Medical Services

## 2009-12-26 ENCOUNTER — Encounter (HOSPITAL_BASED_OUTPATIENT_CLINIC_OR_DEPARTMENT_OTHER): Payer: Self-pay | Admitting: Internal Medicine

## 2009-12-26 LAB — CT HEAD WO CONTRAST

## 2009-12-26 LAB — BLOOD SUGAR FINGERSTICK (POINT OF CARE): FINGERSTICK GLUCOSE: 91 mg/dl (ref 74–160)

## 2009-12-26 NOTE — Discharge Instructions (Signed)
Sncope  (Ataque de Desmaio)  (Syncope (Fainting Episode))     Seu mdico diagnosticou que voc teve uma sncope, perda conscincia breve e repentina, seguida por recuperao completa, provocada por uma falta temporria de oxignio e glicose (acar) no crebro.      H MUITAS CAUSAS, COMO:   Uso de comprimidos para presso sangunea e outros medicamentos que podem levar a presso sangunea para baixo do normal, aliada a mudanas repentinas de postura (por exemplo, levantar depressa).    Excesso de [employer] (tome seus medicamentos como orientado).    Ficar em p tempo demais (por exemplo, em um coro de igreja, etc.), fazendo o sangue empoar nas pernas.    Problemas com ataques.    Baixo teor de acar no sangue (hipoglicemia) causado por diabetes (provocando coma mais comumente).    Fora excessiva ao evacuar (faz com que sua presso sangunea suba repentinamente e o seu corpo compensa, de modo que sua presso sangunea estar baixa demais quando parar de Publishing copy fora).   Endurecimento das artrias e o crebro temporariamente no recebe suficiente sangue.    Batida cardaca irregulare e problemas circulatrios.   Medo, estresse emocional, leso, ver sangue ou doena.      Seu mdico o mandar para casa se achar que a sncope seja atribuvel a causas benignas (no h motivo para se preocupar). Dependendo de sua idade e estado de sade, ele pode pedir que fique sob monitorao e observao. Se voltar para casa, providencie um acompanhante se o mdico achar desejvel.       muito importante manter todas as recomendaes e consultas de acompanhamento para tratar essa condio de forma adequada.    Trata-se de um srio problema que pode levar a doenas graves e  morte se no for tratado [employer].       AVISO: No dirija ou opere mquinas at que seu mdico sinta que  seguro voc fazer isso.     DIRIJA-SE IMEDIATAMENTE AO PRONTO-SOCORRO:   Se desmaiar novamente o ou desmaios enquanto com a pessoa  sentada ou deitada. NO DIRIJA. Telefone para 911 se no houver [place] fonte de [place].    Se sentir dor no peito, nusea, vmito, ou dor abdominal.    Voc apresenta batimentos cardacos irregulares ou muito acelerados (mais de 120 batidas por minuto).   Se tiver insensibilidade ou perda de movimento nos seus braos ou pernas.    Se tiver dificuldade de falar, confuso, fraqueza severa, ou problemas visuais.   Voc se torna e/ou suado sentem luz encabeada.     CERTIFIQUE-SE DE SER REEXAMINADO COMO INSTRUDO.     Document Released: 09/20/2005  Document Re-Released: 11/01/2006  [facility] Patient Information 2010 [place], [place].Ferimentos na Higher education careers adviser, Adultos  (Head Injuries Adult)      Voc recebeu um ferimento na cabea que no parece ser srio por enquanto. Uma concusso  um estado de capacidade mental abalada, normalmente provinda de um golpe  cabea. Deve tomar lquidos leves durante o resto do dia e ento voltar  dieta regular. No se devem tomar sedativos nem bebidas alcolicas durante 48 horas aps receber alta. A maioria de problemas decorrentes de ferimentos deste tipo ocorre [employer] primeiras 24 horas.      SINTOMAS MENORES QUE PODEM SER EXPERIMENTADOS DEPOIS DE RECEBER ALTA:   Dificuldades de memria   Tontura   Dores de cabea  Viso dupla   Problemas de audio   Depresso  Cansao   Fraqueza   Dificuldade em concentrar      Se  sentir qualquer destes sintomas, no se assuste. Um traumatismo cerebral (concusso) requer alguns dias para a recuperao, assim como um ferimento em qualquer outra parte do seu corpo. Muitos pacientes com ferimentos semelhantes ao seu freqentemente experimentam tais sintomas. Estes problemas normalmente desaparecem sem cuidado mdico. Entretanto, se os sintomas persistirem por mais de um dia, notifique seu mdico. Consulte seu mdico logo se os sintomas piorarem ao invs de melhorem.      INSTRUES PARA TRATAMENTO DOMICILIAR:   Durante as prximas 24 horas  voc deve ficar com um acompanhante que possa observar se apresenta os sinais acima.     Embora seja pouco provvel que efeitos colaterais srios venham a Cabin crew, voc deve ficar atento para sinais e sintomas que possam requerer que retorne a Furniture conservator/restorer. Os efeitos colaterais podem ocorrer de 7 a 10 aps o ferimento.   importante que voc monitore seu estado cuidadosamente, entre em contato com seu mdico ou procure por assistncia mdica imediata se houver qualquer mudana em [place].     CONSULTE O SEU MDICO SE QUALQUER DOS SEGUINTES SINTOMAS APARECER:   Desorientao ou sonolncia  -  as crianas, entretanto, freqentemente ficam sonolentas depois de qualquer espcie de trauma ou ferimento.    No consegue acordar a pessoa ferida.    Nusea ou vmito forado e persistente (em jatos).    Tontura  -  isto pode ser constatado quando o paciente move seus olhos rapidamente de um lado a outro.   Convulses ou inconscincia.    Fortes e persistentes dores de cabea no aliviadas por medicamentos. S tome no balcao ou medicinas de receita para dor, incmodo, ou febre como dirigido por seu mdico.   Incapacidade de usar os braos ou pernas corretamente.    Mudanas no [employer].    Secreo clara ou sangrenta pelo nariz ou orelhas.     CERTIFIQUE-SE DE:   Compreende as instrues referentes  alta.    Ir monitorar sua condio.   Procurar assistncia mdica imediatamente, conforme indicado.     Document Released: 06/30/2005  Document Re-Released: 09/02/2008  [facility] Patient Information 2010 [place], [place].Ferimentos na Higher education careers adviser, Adultos  (Head Injuries Adult)      Voc recebeu um ferimento na cabea que no parece ser srio por enquanto. Uma concusso  um estado de capacidade mental abalada, normalmente provinda de um golpe  cabea. Deve tomar lquidos leves durante o resto do dia e ento voltar  dieta regular. No se devem tomar sedativos nem bebidas alcolicas durante 48 horas aps  receber alta. A maioria de problemas decorrentes de ferimentos deste tipo ocorre [employer] primeiras 24 horas.      SINTOMAS MENORES QUE PODEM SER EXPERIMENTADOS DEPOIS DE RECEBER ALTA:   Dificuldades de memria   Tontura   Dores de cabea  Viso dupla   Problemas de audio   Depresso  Cansao   Fraqueza   Dificuldade em concentrar      Se sentir qualquer destes sintomas, no se assuste. Um traumatismo cerebral (concusso) requer alguns dias para a recuperao, assim como um ferimento em qualquer outra parte do seu corpo. Muitos pacientes com ferimentos semelhantes ao seu freqentemente experimentam tais sintomas. Estes problemas normalmente desaparecem sem cuidado mdico. Entretanto, se os sintomas persistirem por mais de um dia, notifique seu mdico. Consulte seu mdico logo se os sintomas piorarem ao invs de melhorem.      INSTRUES PARA TRATAMENTO DOMICILIAR:   Durante as prximas 24 horas voc deve ficar com um acompanhante que possa  observar se apresenta os sinais acima.     Embora seja pouco provvel que efeitos colaterais srios venham a Cabin crew, voc deve ficar atento para sinais e sintomas que possam requerer que retorne a Furniture conservator/restorer. Os efeitos colaterais podem ocorrer de 7 a 10 aps o ferimento.   importante que voc monitore seu estado cuidadosamente, entre em contato com seu mdico ou procure por assistncia mdica imediata se houver qualquer mudana em [place].     CONSULTE O SEU MDICO SE QUALQUER DOS SEGUINTES SINTOMAS APARECER:   Desorientao ou sonolncia  -  as crianas, entretanto, freqentemente ficam sonolentas depois de qualquer espcie de trauma ou ferimento.    No consegue acordar a pessoa ferida.    Nusea ou vmito forado e persistente (em jatos).    Tontura  -  isto pode ser constatado quando o paciente move seus olhos rapidamente de um lado a outro.   Convulses ou inconscincia.    Fortes e persistentes dores de cabea no aliviadas por medicamentos. S  tome no balcao ou medicinas de receita para dor, incmodo, ou febre como dirigido por seu mdico.   Incapacidade de usar os braos ou pernas corretamente.    Mudanas no [employer].    Secreo clara ou sangrenta pelo nariz ou orelhas.     CERTIFIQUE-SE DE:   Compreende as instrues referentes  alta.    Ir monitorar sua condio.   Procurar assistncia mdica imediatamente, conforme indicado.     Document Released: 06/30/2005  Document Re-Released: 09/02/2008  [facility] Patient Information 2010 [place], [place].

## 2009-12-26 NOTE — ED Notes (Signed)
Pt reportedly was on her knees praying before going to bed.  Got up, her leg was asleep and she fell backward striking her head on a lamp.  C/o neck and head pain.

## 2009-12-28 LAB — EKG

## 2009-12-28 LAB — EMERGENCY ROOM NOTE

## 2009-12-29 ENCOUNTER — Encounter (HOSPITAL_BASED_OUTPATIENT_CLINIC_OR_DEPARTMENT_OTHER): Payer: Self-pay | Admitting: Internal Medicine

## 2009-12-29 DIAGNOSIS — B690 Cysticercosis of central nervous system: Secondary | ICD-10-CM | POA: Insufficient documentation

## 2010-01-05 ENCOUNTER — Ambulatory Visit (HOSPITAL_BASED_OUTPATIENT_CLINIC_OR_DEPARTMENT_OTHER): Payer: No Typology Code available for payment source | Admitting: Urology

## 2010-01-14 ENCOUNTER — Telehealth (HOSPITAL_BASED_OUTPATIENT_CLINIC_OR_DEPARTMENT_OTHER): Payer: Self-pay | Admitting: Internal Medicine

## 2010-01-14 ENCOUNTER — Encounter (HOSPITAL_BASED_OUTPATIENT_CLINIC_OR_DEPARTMENT_OTHER): Payer: Self-pay | Admitting: Internal Medicine

## 2010-01-14 NOTE — Telephone Encounter (Signed)
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  Curbside of ID done to determine whether any further work up need be done, and if treatment would be indicated in any circumstances.  The advice was to notify her of the brain findings, and reassure her that no tx necessary.  I called her phone number to discuss and left a message.  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 No treatment is indicated without evidence of an active lesion.

## 2010-01-14 NOTE — Telephone Encounter (Signed)
Message copied by Windell Moulding on Wed Jan 14, 2010 11:24 AM  ------       Message from: Riccardo Dubin       Created: Wed Jan 14, 2010 11:00 AM       Regarding: Returning Dr. Thomasene Lot phone call       Contact: [phone]         Loretta Martin is a 49 year old old female.              Patient's PCP: Arlice Colt, MD              In case we get disconnected what is the best number to reach you at today:       Home Phone [phone] (home)              Person calling:       Patient (self)              How can I help you today:        Patient is returning Dr. Thomasene Lot phone call              Patient's language of care: [place]              Would you like an interpreter when the nurse calls you back?       NO

## 2010-01-15 NOTE — Telephone Encounter (Signed)
Called and explained as below

## 2010-01-16 ENCOUNTER — Encounter (HOSPITAL_BASED_OUTPATIENT_CLINIC_OR_DEPARTMENT_OTHER): Payer: Self-pay | Admitting: Urology

## 2010-02-02 ENCOUNTER — Ambulatory Visit (HOSPITAL_BASED_OUTPATIENT_CLINIC_OR_DEPARTMENT_OTHER): Payer: No Typology Code available for payment source | Admitting: Urology

## 2010-02-02 VITALS — BP 134/72 | HR 74 | Temp 97.8°F | Resp 18

## 2010-02-02 DIAGNOSIS — N393 Stress incontinence (female) (male): Secondary | ICD-10-CM

## 2010-02-02 NOTE — Progress Notes (Signed)
tolerated UDS procedure well.

## 2010-02-16 ENCOUNTER — Ambulatory Visit (HOSPITAL_BASED_OUTPATIENT_CLINIC_OR_DEPARTMENT_OTHER): Payer: No Typology Code available for payment source | Admitting: Urology

## 2010-02-16 DIAGNOSIS — N393 Stress incontinence (female) (male): Secondary | ICD-10-CM

## 2010-02-23 NOTE — Progress Notes (Signed)
Today, she comes back and she has stated that things have not improved and on further discussion with her, she has decided she will undergo urodynamics studies.  At this time, she was properly consented and subsequently was brought to the urodynamics suite.      She was asked to void spontaneously to record an initial uroflow and post void residual.    The vesical pressure catheter was placed per urethra into the bladder, and the abdominal pressure sensor was placed in the vagina.  Both were taped in place.  Subsequently, the EMG pads were also placed and at this time, the procedure commenced.  Her bladder was filled with water and she stated when she had a first desire to urinate, when she had a normal desire, and then when she had maximal capacity.  Subsequently then, the bulbocavernosus reflex was performed.  Then she was given the command to Valsalva and cough both in the supine position, also in the standing position.  Finally, she was given the micturition command to urinate when she moved over to the commode.  This was then also documented.  She then returned to the table and a urethral pressure profile was performed.  All the catheters and the pads were then removed.    She was dressed and she was given a follow-up appointment to return in 1 week to go over those results.    Full report can be found in the electroic chart however, briefly  First sensation-56  First desire-171  Full capacity-334    Patient did leak with valsalva or coughing with a full bladder  No detrusor overactivity on filling  Detrusor Acitivity with micturation  sphincteric dyssyergia  Normal bulbocavernosal reflex and UPP  Unobstructed uroflow parameters    AP  The patient does show leakage upon valsalva with a full bladder.  This may be the result of a hypermobile urethra and may be addressed with surgical procedures such as a sling or collagen injection therapy.  There does not appear to be bladder overactivity and the  capacity appears adequate as well.  At the next visit, I will discuss with the patient all of her options and if an pelvic was deferred, I will perform one at that time.

## 2010-02-25 ENCOUNTER — Ambulatory Visit: Payer: Self-pay | Admitting: Internal Medicine

## 2010-02-27 DIAGNOSIS — N393 Stress incontinence (female) (male): Secondary | ICD-10-CM | POA: Insufficient documentation

## 2010-02-27 NOTE — Progress Notes (Signed)
The patient does show leakage upon valsalva with a full bladder.  This may be the result of a hypermobile urethra and may be addressed with surgical procedures such as a sling or collagen injection therapy.  There does not appear to be bladder overactivity and the capacity appears adequate as well.    I have discussed these findings with the patient.  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 We have gone over these results and have  discussed all of the options which include surgical intervention such as a sling.  She does understand all the risks and benefits and has elected a sling.  She has signed the informed consent and will be booked for the surgery.

## 2010-03-03 LAB — MA SCREENING MAMMO BILATERAL WITH CAD

## 2010-04-21 ENCOUNTER — Ambulatory Visit (HOSPITAL_BASED_OUTPATIENT_CLINIC_OR_DEPARTMENT_OTHER): Payer: Self-pay | Admitting: Urology

## 2010-04-28 ENCOUNTER — Ambulatory Visit (HOSPITAL_BASED_OUTPATIENT_CLINIC_OR_DEPARTMENT_OTHER)
Admit: 2010-04-28 | Discharge: 2010-04-28 | Disposition: A | Discharge status: Home / Self Care | Payer: Self-pay | Attending: Urology | Admitting: Urology

## 2010-07-16 ENCOUNTER — Other Ambulatory Visit (HOSPITAL_BASED_OUTPATIENT_CLINIC_OR_DEPARTMENT_OTHER): Payer: Self-pay | Admitting: Internal Medicine

## 2010-07-16 DIAGNOSIS — R05 Cough: Principal | ICD-10-CM

## 2010-07-16 DIAGNOSIS — R059 Cough, unspecified: Secondary | ICD-10-CM

## 2010-07-16 MED ORDER — FLUTICASONE PROPIONATE 50 MCG/ACT NA SUSP
2.0000 | Freq: Every day | NASAL | Status: DC
Start: 2010-07-16 — End: 2011-08-18

## 2010-07-16 NOTE — Telephone Encounter (Signed)
Loretta Martin is a 49 year old female has requested a refill of Flonase nasal spray    Other Med Adult:  Most Recent BP Reading(s)  02/02/10 : 134/72        Cholesterol (mg/dl)   Date     Date  Value    08/10/2005  172    ----------    LDL (mg/dl)   Date     Date  Value    08/10/2005  126*   ----------    HDL (mg/dl)   Date     Date  Value    08/10/2005  46    ----------    TRIGLYCERIDES (mg/dl)   Date     Date  Value    08/10/2005  79    ----------        THYROID SCREEN TSH (uIU/mL)   Date     Date  Value    10/31/2008  2.83    ----------        THYROID STIM HORMONE (uIU/ml)   Date     Date  Value    08/10/2005  3.36    ----------      No results found for this basename: [id number]:1        INR (no units)   Date     Date  Value    05/12/2009  1.0*   ----------       Documented [facility] [place] [phone] Fax: [phone]

## 2010-07-16 NOTE — Telephone Encounter (Signed)
[facility]    Person calling on behalf of patient: Patient (self)    May list multiple medications in this section  Medicine Name: FLONASE 50 MCG/DOSE NA INHA   Dosage:   Frequency (how many pills, how many times a day):   Number of pills left:   Documented patient preferred pharmacies:  [facility] [place] [phone] Fax: [phone]  Pharmacy Name:   Pharmacy Telephone Number:   Pharmacy  Fax Number:     CALL BACK NUMBER:   Cell phone:   Other phone:    Available times:    Patient's language of care: English    Patient does not need an interpreter.

## 2010-08-25 ENCOUNTER — Ambulatory Visit (HOSPITAL_BASED_OUTPATIENT_CLINIC_OR_DEPARTMENT_OTHER): Payer: No Typology Code available for payment source | Admitting: Internal Medicine

## 2010-09-15 ENCOUNTER — Encounter (HOSPITAL_BASED_OUTPATIENT_CLINIC_OR_DEPARTMENT_OTHER): Payer: Self-pay | Admitting: Internal Medicine

## 2010-09-15 ENCOUNTER — Ambulatory Visit (HOSPITAL_BASED_OUTPATIENT_CLINIC_OR_DEPARTMENT_OTHER): Payer: No Typology Code available for payment source | Admitting: Internal Medicine

## 2010-09-15 VITALS — BP 94/64 | HR 84 | Temp 98.1°F | Ht 61.81 in | Wt 142.0 lb

## 2010-09-15 DIAGNOSIS — K589 Irritable bowel syndrome without diarrhea: Secondary | ICD-10-CM

## 2010-09-15 DIAGNOSIS — F329 Major depressive disorder, single episode, unspecified: Secondary | ICD-10-CM

## 2010-09-15 DIAGNOSIS — R21 Rash and other nonspecific skin eruption: Secondary | ICD-10-CM

## 2010-09-15 DIAGNOSIS — M549 Dorsalgia, unspecified: Secondary | ICD-10-CM

## 2010-09-15 DIAGNOSIS — E663 Overweight: Secondary | ICD-10-CM

## 2010-09-15 DIAGNOSIS — R920 Mammographic microcalcification found on diagnostic imaging of breast: Secondary | ICD-10-CM

## 2010-09-15 DIAGNOSIS — F3289 Other specified depressive episodes: Secondary | ICD-10-CM

## 2010-09-15 DIAGNOSIS — M25571 Pain in right ankle and joints of right foot: Secondary | ICD-10-CM

## 2010-09-15 MED ORDER — NAPROXEN SODIUM 220 MG PO TABS
220.0000 mg | ORAL_TABLET | Freq: Two times a day (BID) | ORAL | Status: AC
Start: 2010-09-15 — End: 2010-10-15

## 2010-09-15 MED ORDER — GABAPENTIN 100 MG PO CAPS
100.0000 mg | ORAL_CAPSULE | Freq: Three times a day (TID) | ORAL | Status: DC
Start: 2010-09-15 — End: 2010-12-28

## 2010-09-15 MED ORDER — KETOCONAZOLE 2 % EX CREA
TOPICAL_CREAM | Freq: Two times a day (BID) | CUTANEOUS | Status: AC
Start: 2010-09-15 — End: 2010-10-15

## 2010-09-15 MED ORDER — FLUOXETINE HCL 20 MG PO CAPS
20.0000 mg | ORAL_CAPSULE | Freq: Every morning | ORAL | Status: DC
Start: 2010-09-15 — End: 2011-06-29

## 2010-09-15 NOTE — Progress Notes (Signed)
Loretta Martin is a 49 year old female who presents with multiple concerns of pain.     1)Right  Ankle pain which comes and goes. Not painful every day.   Lasts for 1-2 days when it comes and occurs more than 1 time a week.  Sometimes can not walk because of pain.  Increasing in frequency.  Began last month, (although sometimes seems to think this has happened more frequently  2) Pain at the lower back.  Worried about kidney pain.  Has moderate pain at Both sides.  Does get some exercise including Zumba up to 2 times a week, but not in the last week.  Ibuprofen 800 mg, helps with back.  Back pain never stops completely.    No fevers or chills.  No dysuria or hematuria.  3) Burning like fire at 4th toe medial aspect right foot.  Has been present for a couple of weeks.  No acute trigger, but does have some rash there  4) Irregular use of prozac.  Takes it "as needed."  We discussed that she is adjusting the dose, but that would be better served by taking lower dose regularly so that we would know how to adjust  5) weight increasing.  Not sure why.  "I am not eating much."  Counseled re diet and exercise and triggers for weight gain.  6) Omega 3 fatty acids  7) reviewed mammogram and reminder for when she will be due to get this again  OBJECTIVE:  BP 94/64   Pulse 84   Temp(Src) 98.1 F (36.7 C) (Oral)   Ht 5' 1.81" (1.57 m)   Wt 142 lb (64.411 kg)   BMI 26.13 kg/m2   SpO2 98%  Pleasant woman NAD  Cervical, thoracic and lumbar spine exam is normal without tenderness, masses or kyphoscoliosis. Full range of motion without pain is noted.  Ankle exam - both sides normal; full range of motion, no pain on motion, no effusion, tenderness, ligamentous instability or deformity noted.  There is peeling and mild erythema at medial aspect of 4th toe at right    724.5E Back pain  (primary encounter diagnosis)  Comment: no clear trigger, but by hx and exam seems musculoskelatal, and may be worsened by going off ssri.  Will  trial NSAIDS and gabapentin for multiple pains  Plan: gabapentin (NEURONTIN) 100 MG capsule, naproxen        sodium (ANAPROX) 220 MG tablet    311 DEPRESSIVE DISORDER NEC  Comment: as above,  Adjusted dose  Plan: fluoxetine (PROZAC) 20 MG capsule, gabapentin         (NEURONTIN) 100 MG capsule    564.1U IBS (irritable bowel syndrome)  Comment: stable now.  Educated re diet and lifestyle modifications    278.02 Overweight  Comment: counselled extensively as above  Plan: fish oil-omega-3 fatty acids 1000 MG capsules    719.47U Ankle pain, right  Comment: no clear injury, but will follow  Plan: gabapentin (NEURONTIN) 100 MG capsule    782.1R Rash  Comment: burning and rash c/w tinea pedis  Plan: ketoconazole (NIZORAL) 2 % cream    793.81 Mammographic microcalcification  Comment: clarified screening schedule

## 2010-12-16 ENCOUNTER — Ambulatory Visit (HOSPITAL_BASED_OUTPATIENT_CLINIC_OR_DEPARTMENT_OTHER): Payer: No Typology Code available for payment source | Admitting: Internal Medicine

## 2010-12-28 ENCOUNTER — Encounter (HOSPITAL_BASED_OUTPATIENT_CLINIC_OR_DEPARTMENT_OTHER): Payer: Self-pay | Admitting: Internal Medicine

## 2010-12-28 ENCOUNTER — Ambulatory Visit (HOSPITAL_BASED_OUTPATIENT_CLINIC_OR_DEPARTMENT_OTHER): Payer: No Typology Code available for payment source | Admitting: Internal Medicine

## 2010-12-28 VITALS — BP 120/74 | HR 92 | Temp 98.0°F

## 2010-12-28 DIAGNOSIS — Z833 Family history of diabetes mellitus: Secondary | ICD-10-CM

## 2010-12-28 DIAGNOSIS — M79674 Pain in right toe(s): Secondary | ICD-10-CM

## 2010-12-28 DIAGNOSIS — K589 Irritable bowel syndrome without diarrhea: Secondary | ICD-10-CM

## 2010-12-28 DIAGNOSIS — F329 Major depressive disorder, single episode, unspecified: Secondary | ICD-10-CM

## 2010-12-28 DIAGNOSIS — M549 Dorsalgia, unspecified: Secondary | ICD-10-CM

## 2010-12-28 DIAGNOSIS — R920 Mammographic microcalcification found on diagnostic imaging of breast: Secondary | ICD-10-CM

## 2010-12-28 DIAGNOSIS — F3289 Other specified depressive episodes: Secondary | ICD-10-CM

## 2010-12-28 MED ORDER — GABAPENTIN 300 MG PO CAPS
300.0000 mg | ORAL_CAPSULE | Freq: Two times a day (BID) | ORAL | Status: DC
Start: 2010-12-28 — End: 2011-08-18

## 2010-12-28 NOTE — Progress Notes (Signed)
Loretta Martin is a 50 year old female for follow up of multiple issues    1) Back hurts a lttle, but not too much.  Finds gabapentin very helpful without side effects.  Mood better.  We discussed use of Gabapentin at higher dose, and patient does want to increase this.    2) Right toe "wart " 4th digit.  Has had pain there and used wart destruction solutions there.  Still has pain, especially when wearing tight shoes    3) Headache - no new issues and relatively well controlled    4) worried about family hx of diabetes, wants blood tests for this.  Also concerned about vitamin D status.  We discuseed use of calcium and vitamin D suplementation    5) due for mammogram soon.  Has had abnormal exam in the past    6) No concerns with GI at this time    OBJECTIVE:  BP 120/74   Pulse 92   Temp(Src) 98 F (36.7 C) (Oral)   SpO2 96%  Pleasant woman NAD  Alert well groomed lucid and logical  Foot exam show mild "bunching of toes, and mild redness at medial aspect of 4th DIP.        724.5E Back pain  (primary encounter diagnosis)  Comment: doing better, and wants to increase gabapentin  Plan: gabapentin (NEURONTIN) 300 MG capsule, ROUTINE         VENIPUNCTURE, VITAMIN D,25 HYDROXY    311 DEPRESSIVE DISORDER NEC  Comment: doing well  Plan: gabapentin (NEURONTIN) 300 MG capsule    793.81 Mammographic microcalcification  Comment: due for screening  Plan: ORDER FOR MAMMOGRAM (SCREENING)    729.5FD Pain in toe of right foot  Comment: there is no wart at this time.  Patient needs to wear shoes that do not crush toe box  Plan: ROUTINE VENIPUNCTURE    V18.0X Family history of diabetes mellitus type II  Plan: ROUTINE VENIPUNCTURE, CHOLESTEROL, HIGH DENSITY        LIPOPROTEIN, LOW DENSITY LIPOPROTEIN,DIRECT,         HEMOGLOBIN A1C    564.1U IBS (irritable bowel syndrome)  Comment: stable  Plan: doing well     > 50 % of this visit spent in counselling  Face to face 25 minutes  I have reviewed the past medical, surgical, social  and family history and updated these sections of EpicCare as relevant. All interim labs, test results, and consult notes were reviewed and discussed with [name]. Medications were reconciled during this visit and a current medication list was given to the patient at the end of the visit.

## 2010-12-29 LAB — VITAMIN D,25 HYDROXY: VITAMIN D,25 HYDROXY: 29.4 ng/ml — ABNORMAL LOW (ref 30.0–100.0)

## 2010-12-29 LAB — CHG LIPOPROTEIN DIR MEAS HIGH DENSITY CHOLESTEROL: HIGH DENSITY LIPOPROTEIN: 57 mg/dl (ref 35–85)

## 2010-12-29 LAB — HEMOGLOBIN A1C
ESTIMATED AVERAGE GLUCOSE: 111 (ref 74–160)
HEMOGLOBIN A1C: 5.5 % (ref 0–6.0)

## 2010-12-29 LAB — CHOLESTEROL: Cholesterol: 207 mg/dl — ABNORMAL HIGH (ref 0–200)

## 2010-12-29 LAB — CHG LIPOPROTEIN DIRECT MEASUREMENT LDL CHOLESTEROL: LOW DENSITY LIPOPROTEIN DIRECT: 130 mg/dl — ABNORMAL HIGH (ref 0–100)

## 2011-01-04 ENCOUNTER — Telehealth (HOSPITAL_BASED_OUTPATIENT_CLINIC_OR_DEPARTMENT_OTHER): Payer: Self-pay | Admitting: Internal Medicine

## 2011-01-04 DIAGNOSIS — E559 Vitamin D deficiency, unspecified: Secondary | ICD-10-CM

## 2011-01-04 MED ORDER — VITAMIN D 400 UNITS PO TABS
400.0000 [IU] | ORAL_TABLET | Freq: Every day | ORAL | Status: DC
Start: 2011-01-04 — End: 2011-08-18

## 2011-01-04 NOTE — Telephone Encounter (Signed)
Called to give blood test results.  mwessage left.  Will send letter.  Message stated that overall labs good and vitamin d was just a little low.

## 2011-02-04 ENCOUNTER — Other Ambulatory Visit (HOSPITAL_BASED_OUTPATIENT_CLINIC_OR_DEPARTMENT_OTHER): Payer: Self-pay | Admitting: Internal Medicine

## 2011-02-04 DIAGNOSIS — R059 Cough, unspecified: Secondary | ICD-10-CM

## 2011-02-04 DIAGNOSIS — R05 Cough: Principal | ICD-10-CM

## 2011-02-04 MED ORDER — PROAIR HFA 108 (90 BASE) MCG/ACT IN AERS
2.0000 | INHALATION_SPRAY | RESPIRATORY_TRACT | Status: DC | PRN
Start: 2011-02-04 — End: 2012-12-01

## 2011-02-04 NOTE — Telephone Encounter (Signed)
[facility]    Person calling on behalf of patient: Patient (self)    May list multiple medications in this section  Medicine Name: Albuterol inhaler  Dosage:   Frequency (how many pills, how many times a day):   Number of pills left:     Documented patient preferred pharmacies:  [facility] [place] [phone] Fax: [phone]    Patient's language of care: English    Patient does not need an interpreter.

## 2011-02-04 NOTE — Telephone Encounter (Signed)
Kathia Covington is a 50 year old female has requested a refill of Proair.  Other Med Adult:  Most Recent BP Reading(s)  12/28/10 : 120/74        Cholesterol (mg/dl)   Date     Date  Value    12/28/2010  207*   ----------    LDL (mg/dl)   Date     Date  Value    12/28/2010  130*   ----------    HDL (mg/dl)   Date     Date  Value    12/28/2010  57    ----------    TRIGLYCERIDE (mg/dl)   Date     Date  Value    08/10/2005  79    ----------        THYROID SCREEN TSH (uIU/mL)   Date     Date  Value    10/31/2008  2.83    ----------        THYROID STIM HORMONE (uIU/ml)   Date     Date  Value    08/10/2005  3.36    ----------      HEMOGLOBIN A1C (%)   Date     Date  Value    12/28/2010  5.5    ----------        INR (no units)   Date     Date  Value    05/12/2009  1.0*   ----------       Documented patient [facility] [place] [phone] Fax: [phone]

## 2011-02-09 ENCOUNTER — Ambulatory Visit (HOSPITAL_BASED_OUTPATIENT_CLINIC_OR_DEPARTMENT_OTHER): Payer: No Typology Code available for payment source | Admitting: Internal Medicine

## 2011-02-09 ENCOUNTER — Telehealth (HOSPITAL_BASED_OUTPATIENT_CLINIC_OR_DEPARTMENT_OTHER): Payer: Self-pay | Admitting: Ambulatory Care

## 2011-02-09 VITALS — BP 115/80 | HR 89 | Temp 98.0°F | Wt 138.0 lb

## 2011-02-09 DIAGNOSIS — J040 Acute laryngitis: Secondary | ICD-10-CM

## 2011-02-09 NOTE — Telephone Encounter (Signed)
Message copied by Windell Moulding on Tue Feb 09, 2011  9:02 AM  ------       Message from: Lorenza Chick       Created: Tue Feb 09, 2011  8:59 AM       Regarding: triage - sick call        Contact: [phone]         Loretta Martin is a 50 year old old female.              Patient's PCP: Arlice Colt, MD              In case we get disconnected what is the best number to reach you at today:       Home Phone [phone] (home)              Person calling:       Patient (self)              How can I help you today:        Sick Call:         What is your main complaint today?       Cough and hoarseness and difficult for her to talk she said. Wants to know if she can see any doctor today.               The nurse will call you back within one hour.  She may call you at anytime. Can you remain at your telephone for the next 60 minutes?       Yes                       Patient's language of care: [place]              Would you like an interpreter when the nurse calls you back?       NO

## 2011-02-09 NOTE — Telephone Encounter (Signed)
Patient calls with c/o cough and sob. Feels it is worse at night. Started last week with throat irritation and worsened. Given appt today with Suzette Battiest

## 2011-02-09 NOTE — Progress Notes (Signed)
SUBJECTIVE:   Loretta Martin is a 50 year old female who complains of sore throat, chills, cough described as dry and hoarseness for 5 days. She denies a history of fevers, nausea and vomiting and does not have a history of asthma formally, but has been using albuterol for cough variant for years.. Patient does not smoke cigarettes.     OBJECTIVE:  BP 115/80   Pulse 89   Temp(Src) 98 F (36.7 C) (Oral)   Wt 138 lb (62.596 kg)    She appears well, vital signs are as noted by the nurse. Ears normal.  Throat and pharynx normal.  Neck supple. No adenopathy in the neck. Nose is congested. Sinuses non tender. The chest is clear, without wheezes or rales.    ASSESSMENT:   viral upper respiratory illness    PLAN:  Symptomatic therapy suggested: push fluids, rest and ROV prn if symptoms persist or worsen. Call or return to clinic prn if these symptoms worsen or fail to improve as anticipated.

## 2011-03-03 ENCOUNTER — Ambulatory Visit: Payer: Self-pay | Admitting: Internal Medicine

## 2011-03-08 LAB — MA SCREENING MAMMO BILATERAL WITH CAD

## 2011-03-17 ENCOUNTER — Telehealth (HOSPITAL_BASED_OUTPATIENT_CLINIC_OR_DEPARTMENT_OTHER): Payer: Self-pay

## 2011-03-17 NOTE — Telephone Encounter (Signed)
Call back to patient-states going to [place] on June 28th. Came to [place] in 1991, has been back twice since then but not since 2004.does not use computer so no access to online resources. States that her daughter was given vaccines at this clinic because she is going as well. Also calling because she has what thinks are mosquito bites on stomach and under breast for about a month. States that she did not call because thought that they would go away, but states that some did but new ones coming out Very itchy. States that she does have some right now. No one else in the house has these.    Declined appt until evening Wednesday June 20th for evaluation of rash.scheduled to see dr green  In the meantime to provider for vaccine recommendations?

## 2011-03-17 NOTE — Telephone Encounter (Signed)
Message copied by Sharlyne Pacas on Wed Mar 17, 2011  1:03 PM  ------       Message from: Lorenza Chick       Created: Wed Mar 17, 2011 12:48 PM       Regarding: to speak with provider - going out of the country       Contact: [phone]         Loretta Martin is a 50 year old old female.              Patient's PCP: Arlice Colt, MD              In case we get disconnected what is the best number to reach you at today:       Home Phone [phone] (home)              Person calling:       Patient (self)              How can I help you today:        Other going away to [place] for one month. Wants to speak with provider to see if she needs to come in to have any immunization before she leaves.               Patient's language of care: [place]              Would you like an interpreter when the nurse calls you back?       NO

## 2011-03-18 NOTE — Telephone Encounter (Signed)
Call back to patient-going to [place] in [place] per patient.  We are not authorized to administer yellow fever-would have to try to get appt at [facility] for this  Advised immuned to hep b.  Will call patient back.

## 2011-03-18 NOTE — Telephone Encounter (Signed)
It depends where in [place] she is traveling, but at this late a date I am not sure if she can get into a travel clinic.  Yellow fever is a consideration.  She is immune to Hep B

## 2011-03-22 NOTE — Telephone Encounter (Signed)
Not clear what further I need to do.  The patient could book with travel clinic, and I will refer, but there is no vaccine at this site that she needs

## 2011-03-23 NOTE — Telephone Encounter (Signed)
Left message for pt don't think there is any vaccine we have here that she needs for travel but can call for direction

## 2011-03-23 NOTE — Telephone Encounter (Signed)
Call back to patient, lm to call clinic and speak with rn

## 2011-03-23 NOTE — Telephone Encounter (Signed)
Message copied by Windell Moulding on Tue Mar 23, 2011  2:17 PM  ------       Message from: Laury Deep       Created: Tue Mar 23, 2011  2:11 PM       Regarding: returning rn's call       Contact: [phone]         Loretta Martin is a 50 year old old female.              Patient's PCP: Arlice Colt, MD              In case we get disconnected what is the best number to reach you at today:       Mobile Phone Telephone Information:       Mobile          [phone]                     Person calling:       Patient (self)              How can I help you today:        Other returning rn's call              Patient's language of care: [place]              Would you like an interpreter when the nurse calls you back?       NO

## 2011-03-24 ENCOUNTER — Ambulatory Visit (HOSPITAL_BASED_OUTPATIENT_CLINIC_OR_DEPARTMENT_OTHER): Payer: No Typology Code available for payment source | Admitting: Internal Medicine

## 2011-03-30 ENCOUNTER — Telehealth (HOSPITAL_BASED_OUTPATIENT_CLINIC_OR_DEPARTMENT_OTHER): Payer: Self-pay | Admitting: Ambulatory Care

## 2011-03-30 NOTE — Telephone Encounter (Signed)
Spoke with Irving Burton, pharmacist. Pt leaving for [place] tomorrow and needs to fill fluoxetine early. Advised ok to do so

## 2011-03-30 NOTE — Telephone Encounter (Signed)
Message copied by Windell Moulding on Tue Mar 30, 2011  2:04 PM  ------       Message from: Riccardo Dubin       Created: Tue Mar 30, 2011  1:55 PM       Regarding: Authorization for refill Rx early       Contact: [phone]         Loretta Martin is a 50 year old old female.              Patient's PCP: Arlice Colt, MD              In case we get disconnected what is the best number to reach you at today:       Other: [phone]              Person calling:       Pharmacist at [facility]              How can I help you today:        York Spaniel that patient is leaving to [place] tomorrow, patient has refill for Fluoxetine but needs an authorization from PCP to refill it early.              Patient's language of care: [place]              Would you like an interpreter when the nurse calls you back?       NO

## 2011-03-30 NOTE — Telephone Encounter (Signed)
Agree with plan 

## 2011-04-05 NOTE — Telephone Encounter (Signed)
Did not return the call

## 2011-05-10 ENCOUNTER — Telehealth (HOSPITAL_BASED_OUTPATIENT_CLINIC_OR_DEPARTMENT_OTHER): Payer: Self-pay

## 2011-05-10 DIAGNOSIS — Z1329 Encounter for screening for other suspected endocrine disorder: Secondary | ICD-10-CM

## 2011-05-10 NOTE — Telephone Encounter (Signed)
Return call from pt. She was given the message below. She said she will drop off the records from [place] tomorrow morning.

## 2011-05-10 NOTE — Telephone Encounter (Signed)
Drop off copies of all tests done in [place] for Dr Chilton Si to review

## 2011-05-10 NOTE — Telephone Encounter (Signed)
Call to patient, lm to call clinic and speak with rn. In meantime to covering provider. Wait for call back?

## 2011-05-10 NOTE — Telephone Encounter (Signed)
Message copied by Sharlyne Pacas on Mon May 10, 2011  2:01 PM  ------       Message from: Riccardo Dubin       Created: Mon May 10, 2011  1:45 PM       Regarding: Triage       Contact: [phone]         Loretta Martin is a 50 year old old female.              Patient's PCP: Arlice Colt, MD              In case we get disconnected what is the best number to reach you at today:       Home Phone [phone] (home)              Person calling:       Patient (self)              How can I help you today:        Patient said that she was in [place], had a blood work done there and was told that her thyroid is high, Wants to have blood work done here to confirm that.              Patient's language of care: [place]              Would you like an interpreter when the nurse calls you back?       NO

## 2011-05-10 NOTE — Telephone Encounter (Signed)
Message copied by Sharlyne Pacas on Mon May 10, 2011  2:28 PM  ------       Message from: Otilio Saber       Created: Mon May 10, 2011  2:23 PM       Regarding: Returning phone call        Contact: [phone]         Loretta Martin is a 50 year old old female.              Patient's PCP: Arlice Colt, MD              In case we get disconnected what is the best number to reach you at today:       Mobile Phone Telephone Information:       Mobile          [phone]                     Person calling:       Patient (self)              How can I help you today:        Other Returning RN's phone call regarding lab work needed.              Patient's language of care: [place]              Would you like an interpreter when the nurse calls you back?       NO

## 2011-05-10 NOTE — Telephone Encounter (Signed)
Call back to patient, had physical and blood work done in [place] has paperwork-done three weeks ago-told was very high-does not know actual level. Advised will consult provider. Can have lab only visit? eval with provider? Can bring forms in for review? To covering provider.

## 2011-05-10 NOTE — Telephone Encounter (Signed)
Call to patient,lm to call clinic and speak with rn.

## 2011-05-11 ENCOUNTER — Telehealth (HOSPITAL_BASED_OUTPATIENT_CLINIC_OR_DEPARTMENT_OTHER): Payer: Self-pay

## 2011-05-11 ENCOUNTER — Ambulatory Visit (HOSPITAL_BASED_OUTPATIENT_CLINIC_OR_DEPARTMENT_OTHER): Payer: No Typology Code available for payment source

## 2011-05-11 ENCOUNTER — Encounter (HOSPITAL_BASED_OUTPATIENT_CLINIC_OR_DEPARTMENT_OTHER): Payer: Self-pay

## 2011-05-11 VITALS — BP 116/79 | HR 85 | Temp 97.6°F

## 2011-05-11 DIAGNOSIS — D2330 Other benign neoplasm of skin of unspecified part of face: Secondary | ICD-10-CM

## 2011-05-11 NOTE — Progress Notes (Signed)
Loretta Martin presents today after having some facial surgery done in [place] on Friday. She reports she had some broken blood vessels removed from under L eye and a mole removed from L nasolabial area. She did not have the mole sent for pathologic review although the surgeon who removed it recommended it be sent. She has no complaints but does need to have sutures removed.     A&Ox3  NAD  There is mild periorbital bruising. Both the L nasolabial incision and L under eye incision are clean dry and intact w/o evidence of infection or cellulitis. The sutures from both incisions  where removed and a steri strip was placed.    I discussed keeping a steri strip on areas for 1 week and after [place] of Mederma for improvement of scar. I asked her to use caution with this product under her eye.  We also discussed the importance of sunscreen use to prevent permanent redness in an immature scar  Lastly I told her that if she notices the lesion from her nasolabial area coming back she should return to you office, her PCP or dermatologist as we can not r/o malignancy of this lesion since it was not reviewed by pathology. She understand and will do so.

## 2011-05-11 NOTE — Telephone Encounter (Signed)
Records dropped off this am to [facility] for dr green to review as per below. To dr green for follow up per dr Noel Gerold on return to clinic.

## 2011-05-11 NOTE — Telephone Encounter (Signed)
Patient walking in-reports facial surgery this past Friday in [place] remove a vein", asking for stitch removal-told by surgeon there it had to be removed in 5 days-which would be Wednesday of this week  No pain-slightly swollen under eye ? Related to surgery itself. Noted area of stitches under left eye and under left side of nose.  No s/s infection noted at incision sites-consulted dr seth-asked I call surgery to see if they can see patient-  Call to surgical specialties-spoke with judy-patient needs to be evaluated by surgery PA today at 130p at surgical [facility] and then it will be determined if she needs to see plastic surgeon.  Advised patient above-agrees to go to appt  fyi to dr Pennie Rushing

## 2011-05-20 NOTE — Telephone Encounter (Signed)
I do not see documents in any of the materials at my desk.  Willing to reorder a check of labs.  Will send to Loretta Martin to see if she knows where the documents may have gone, and to invite the patient to get repeat blood test

## 2011-06-29 ENCOUNTER — Ambulatory Visit (HOSPITAL_BASED_OUTPATIENT_CLINIC_OR_DEPARTMENT_OTHER): Payer: No Typology Code available for payment source | Admitting: Internal Medicine

## 2011-06-29 ENCOUNTER — Encounter (HOSPITAL_BASED_OUTPATIENT_CLINIC_OR_DEPARTMENT_OTHER): Payer: Self-pay | Admitting: Internal Medicine

## 2011-06-29 ENCOUNTER — Other Ambulatory Visit (HOSPITAL_BASED_OUTPATIENT_CLINIC_OR_DEPARTMENT_OTHER): Payer: Self-pay

## 2011-06-29 VITALS — BP 118/78 | HR 72 | Temp 98.2°F | Wt 141.0 lb

## 2011-06-29 DIAGNOSIS — F3289 Other specified depressive episodes: Secondary | ICD-10-CM

## 2011-06-29 DIAGNOSIS — Z23 Encounter for immunization: Secondary | ICD-10-CM

## 2011-06-29 DIAGNOSIS — F329 Major depressive disorder, single episode, unspecified: Principal | ICD-10-CM

## 2011-06-29 DIAGNOSIS — E559 Vitamin D deficiency, unspecified: Secondary | ICD-10-CM

## 2011-06-29 DIAGNOSIS — B351 Tinea unguium: Secondary | ICD-10-CM

## 2011-06-29 DIAGNOSIS — N393 Stress incontinence (female) (male): Secondary | ICD-10-CM

## 2011-06-29 DIAGNOSIS — M549 Dorsalgia, unspecified: Secondary | ICD-10-CM

## 2011-06-29 MED ORDER — FLUOXETINE HCL 20 MG PO CAPS
40.0000 mg | ORAL_CAPSULE | Freq: Every day | ORAL | Status: DC
Start: 2011-06-29 — End: 2012-07-20

## 2011-06-29 MED ORDER — FLUOXETINE HCL 40 MG PO CAPS
40.0000 mg | ORAL_CAPSULE | Freq: Every day | ORAL | Status: DC
Start: 2011-06-29 — End: 2011-06-29

## 2011-06-29 NOTE — Telephone Encounter (Signed)
Orders done

## 2011-06-29 NOTE — Telephone Encounter (Signed)
Call to pharmacy-spoke with yesika-insurance does not pay for the fluoxetine 40mg , wants to know if ok to give the 20 mg tablets and have patient take two of them. Advised would have provider enter new orders.

## 2011-06-29 NOTE — Progress Notes (Signed)
Pt requesting Flu Vaccine . Confirmed patient's name and date of birth. Pt denies allergies to this vaccine. Pt denies allergies to egg or egg products. Pt denies allergy to contact lens solution. Pt denies pregnancy.     Pt denies adverse effects from previous administration of this medication. Pt denies history of Guillain-Barre' syndrome. Pt denies moderate/severe illness at this time. Risks and benefits of Flu Vaccine reviewed with pt. VIS for Flu Vaccine offered and reviewed with pt.   Flu Vaccine 0.5.ml IM administered. Tolerated well by patient. Patient denies adverse effects from injection at this time. Patient encouraged to utilize arm and not favor it. Patient informed may take pain reliever of choice and to apply ice for discomfort if necessary. Patient will call with any questions or concerns. Please refer to Imm./Inj. section for administration site, lot # and exp. date.  Patient was encouraged to wait for twenty minutes in the lobby.

## 2011-06-29 NOTE — Telephone Encounter (Signed)
Message copied by Sharlyne Pacas on Tue Jun 29, 2011 10:25 AM  ------       Message from: Otilio Saber       Created: Tue Jun 29, 2011 10:18 AM       Regarding: Medication questions        Contact: [phone]         Loretta Martin is a 50 year old old female.              Patient's PCP: Arlice Colt, MD              In case we get disconnected what is the best number to reach you at today:       Pharmacy #  [phone]              Person calling:       [name], Pharmacist              How can I help you today:        Other calling with questions on   fluoxetine , needs clarification.                [facility] [place] [facility]       Phone: [phone] Fax: [phone]                                   Patient's language of care: [place]              Would you like an interpreter when the nurse calls you back?       NO

## 2011-06-29 NOTE — Progress Notes (Signed)
Loretta Martin is a 50 year old female who presents for follow up of multiple issues.  She reports since last visit she has been using 4 meds from [place]: itraconazol for toenails, Sertraline for "thyroid", estriol vaginal cream, and a synthetic estrogen called tibiol.  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  I reviewed with her lab tests from [place] and showed her that T4 normal, and furthermore, that she was not getting thyroid meds.    We discussed that use of 2 SSRIs did not make any sense, and asked if she wanted to increase the dose on either instead.  She does think meds are helpful, so wants to increase prozac.    She complains of low back pain for 3 months, positional with bending or lifting, without radiation down the legs. Worse upon awakening and after resting.  Precipitating factors: none recalled by the patient. Prior history of back problems: recurrent self limited episodes of low back pain in the past. There is no numbness in the legs.    OBJECTIVE:  BP 118/78   Pulse 72   Temp(Src) 98.2 F (36.8 C) (Oral)   Wt 141 lb (63.957 kg)   LMP 11/02/2009   Patient appears to be in mild to moderate pain, antalgic gait noted. Lumbosacral spine area reveals no local tenderness or mass.  Painful and reduced LS ROM noted. Straight leg raise is negative DTR's, motor strength and sensation normal, including heel and toe gait.  Peripheral pulses are palpable. X-Ray: not indicated.    311 DEPRESSIVE DISORDER NEC  (primary encounter diagnosis)  Comment: will stop duplicate SSRI and increase prozac.  No sign of thyroid illness  Plan: DISCONTINUED: fluoxetine (PROZAC) 40 MG capsule    724.5E Back pain  Comment: explained exercise regimen to help and printed up exercises to do at home    625.6 Female stress incontinence  Comment: can continue [place] meds as this seems to help  Plan: estradiol (ESTRACE VAGINAL) 0.1 MG/GM vaginal         cream    268.9G Vitamin D  deficiency  Plan: replacing    110.1C Onychomycosis  Comment: added med to the list  Plan: itraconazole (SPORANOX) 100 MG capsule    V04.81 Flu Vaccine Given  Plan: IMMUNIZATION ADMIN SINGLE, RN, INFLUENZA VIRUS         VACCINE SPLIT VIRUS 3/> YRS IM

## 2011-07-21 ENCOUNTER — Ambulatory Visit (HOSPITAL_BASED_OUTPATIENT_CLINIC_OR_DEPARTMENT_OTHER): Payer: No Typology Code available for payment source | Admitting: Internal Medicine

## 2011-07-21 ENCOUNTER — Telehealth (HOSPITAL_BASED_OUTPATIENT_CLINIC_OR_DEPARTMENT_OTHER): Payer: Self-pay | Admitting: Ambulatory Care

## 2011-07-21 NOTE — Telephone Encounter (Signed)
Message copied by Claudell Kyle on Wed Jul 21, 2011  9:47 AM  ------       Message from: Kerney Elbe       Created: Wed Jul 21, 2011  9:37 AM       Regarding: vaginal issues         [facility]              Eowyn Tabone [id number], 50 year old, female, Telephone Information:       Home Phone      [phone]       Work Phone      [phone]       Mobile          [phone]       Home Phone      [phone]       Work Phone      [phone]                     Cleotis Lema NUMBER: [phone]       Best time to call back:        Cell phone:        Other phone:              Available times:              Patient's language of care: English              Patient does not need an interpreter.              Patient's PCP: Arlice Colt, MD              Person calling on behalf of patient: Patient (self)              Calls today with a sick call. Vaginal issues.              Patient's Preferred [facility]:        [facility]              Phone: [phone] Fax: [phone]

## 2011-07-21 NOTE — Telephone Encounter (Signed)
TC to pt, LM on answering machine for pt to return call to clinic today.

## 2011-07-21 NOTE — Telephone Encounter (Signed)
Spoke with pt. C/o body rash and hand pain. Asking for xray of hand, trauma denied. Discussed rash comes and goes, thinks may be stress related but now her daughter has. Asked if she checked for evidence of bed bugs states no sign of. Given appt with Dr.Stahl today

## 2011-07-21 NOTE — Telephone Encounter (Signed)
Message copied by Windell Moulding on Wed Jul 21, 2011  9:40 AM  ------       Message from: Riccardo Dubin       Created: Wed Jul 21, 2011  9:33 AM       Regarding: Triage       Contact: [phone]         Loretta Martin is a 50 year old old female.              Patient's PCP: Arlice Colt, MD              In case we get disconnected what is the best number to reach you at today:       Home Phone [phone] (home)              Person calling:       Patient (self)              How can I help you today:        Patient said that she is having back pain and also pain on her right hand. Has rashes on her body also. Wants to be seen today              Patient's language of care: [place]              Would you like an interpreter when the nurse calls you back?       YES  [place]

## 2011-07-22 ENCOUNTER — Ambulatory Visit (HOSPITAL_BASED_OUTPATIENT_CLINIC_OR_DEPARTMENT_OTHER): Payer: No Typology Code available for payment source | Admitting: Internal Medicine

## 2011-07-22 ENCOUNTER — Encounter (HOSPITAL_BASED_OUTPATIENT_CLINIC_OR_DEPARTMENT_OTHER): Payer: Self-pay | Admitting: Internal Medicine

## 2011-07-22 VITALS — BP 112/80 | HR 76 | Temp 98.0°F | Wt 143.0 lb

## 2011-07-22 DIAGNOSIS — L989 Disorder of the skin and subcutaneous tissue, unspecified: Secondary | ICD-10-CM

## 2011-07-22 DIAGNOSIS — M79641 Pain in right hand: Secondary | ICD-10-CM

## 2011-07-22 DIAGNOSIS — M545 Low back pain, unspecified: Secondary | ICD-10-CM

## 2011-07-22 MED ORDER — IBUPROFEN 800 MG PO TABS
800.0000 mg | ORAL_TABLET | ORAL | Status: DC
Start: 2011-07-22 — End: 2011-08-18

## 2011-07-22 MED ORDER — LORATADINE 10 MG PO TABS
10.0000 mg | ORAL_TABLET | Freq: Every day | ORAL | Status: DC
Start: 2011-07-22 — End: 2012-10-23

## 2011-07-22 MED ORDER — MOMETASONE FUROATE 0.1 % EX CREA
TOPICAL_CREAM | Freq: Two times a day (BID) | CUTANEOUS | Status: AC
Start: 2011-07-22 — End: 2012-07-21

## 2011-07-22 NOTE — Telephone Encounter (Signed)
TC to pt, LM on answering machine for pt to return call to clinic today.

## 2011-07-22 NOTE — Patient Instructions (Signed)
Try ibuprofen three times daily for 5 days then as needed after that for hand pain.   Can use ice. 20 minutes twice daily if needed.

## 2011-07-22 NOTE — Progress Notes (Signed)
Loretta Martin is a 50 year old female who presents with the following problems:  Paient notes back pain, chronic but currently pain exacerbated and has been 8 out of 10 for months. Notes worse when she bends down. Pain localized on bilateral illiac crest and does not radiate. No changes in bowel or bladder. No other neurologic changes. Dull and achy.     Hand pain on dorsal side over metatarsals. Right hand only. Pain worsened with grasp, no exacerbation with movement of fingers or wrist. No swelling or erythema. Pulsating pain. Denies hand pain at night. Denies numbness.     Itchy lesions, increased since February but has had them off and on all her life. Denies any infestation. Daughter with similar problem, but does not believe it is related to bed bugs. On arms and legs, mainly. No changes or worsening with soaps or detergents.           ROS:  Cor: no chest pains or palps  Pulm: no SOB, cough  MSK: no joint pains or swelling other than current hand pian.   Skin: as above      Past medical history/Past surgical History  Noted in Epic    Social History Narrative  Noted in Epic    Family History Narrative  Noted in Epic      Review of Patient's Allergies indicates:   Pcn (penicillins)           Comment:Got a urine infection after using   Acetaminophen w/ hy*    Nausea and Vomiting        Current outpatient prescriptions ordered prior to encounter:  itraconazole (SPORANOX) 100 MG capsule Take  by mouth daily. Disp:  Rfl: 0   estradiol (ESTRACE VAGINAL) 0.1 MG/GM vaginal cream Place 2 g vaginally daily. Disp: 42.5 g Rfl: 0   fluoxetine (PROZAC) 20 MG capsule Take 2 capsules by mouth daily. Disp: 60 capsule Rfl: 12   PROAIR HFA 108 (90 BASE) MCG/ACT inhaler Inhale 2 puffs into the lungs every 4 (four) hours as needed (cough ). Disp: 1 Inhaler Rfl: 11   Cholecalciferol (VITAMIN D) 400 UNIT tablet Take 1 tablet by mouth daily. Disp: 30 tablet Rfl: 12   gabapentin (NEURONTIN) 300 MG capsule Take 1 capsule by  mouth 2 (two) times daily. Disp: 60 capsule Rfl: 6   fish oil-omega-3 fatty acids 1000 MG capsule Take 2 capsules by mouth daily. Disp: 30 capsule Rfl:    fluticasone (FLONASE) 50 MCG/ACT nasal spray 2 sprays by Each Nostril route daily. Disp: 48 g Rfl: 3         OBJECTIVE:  BP 112/80   Pulse 76   Temp(Src) 98 F (36.7 C) (Oral)   Wt 143 lb (64.864 kg)   SpO2 96%   LMP 11/02/2009  General: seated comfortable in NAD, alert, pleasant and cooperative  Eyes: Sclera anicteric.  HEENT: Mucous membranes moist.  Cardiac: Heart regular rate rhythm. Normal S1 S2. Without murmur gallop or rub. No clubbing cyanosis or edema of extremities. DP and PT pulses +2.   Pulm: Patient speaking in full sentences. Lungs clear to auscultation.   Ext: Lower/ upper extremities. No erythema or deformity noted. Hands full range of motion without point tenderness or muscle wasting. Strength intact. TTP diffusely in lumbar paraspinal region.   Skin: Warm, dry and red lesions with excoriation. Scattered on upper and lower extremities.            A/P  724.2 Lumbago  (primary encounter  diagnosis)  Comment: Musculoskeletal, discussed benefits of physical therapy, will refer today and encourage follow up with Dr Chilton Si.  Plan: REFERRAL TO PHYSICAL THERAPY ( INT)    729.5EX Hand pain, right  Comment: Unsure diagnosis, may be nonspecific from overuse. No point tendernous to suggest tenopathy. Discussed ice and NSAIDs prn. Follow with PMD.  Plan: ibuprofen (ADVIL,MOTRIN) 800 MG tablet    709.9G Skin lesion  Comment: discussed possibility of insect bites and patient denies, although will consider if more of her family is affected. Discussed that this could be a skin reaction from seasonal allergens as well. Will give trial of claritin mometasone cream for current lesions.  Plan: mometasone (ELOCON) 0.1 % cream, loratadine         (CLARITIN) 10 MG tablet        Return to Clinic:  In 1-2 months for PMD follow up, or sooner if problems arise.

## 2011-07-27 ENCOUNTER — Ambulatory Visit (HOSPITAL_BASED_OUTPATIENT_CLINIC_OR_DEPARTMENT_OTHER): Payer: No Typology Code available for payment source | Admitting: Rehabilitative and Restorative Service Providers"

## 2011-07-27 DIAGNOSIS — M545 Low back pain, unspecified: Secondary | ICD-10-CM

## 2011-07-27 NOTE — Telephone Encounter (Signed)
TC to pt, who states she would like to have an appt to see someone GB:[id number] issues. Reports having stress incontinence when she sneezes for several months. Denies any UTI sxs (dysuria/freq/fever.)  States "I think I might need surgery to fix it"  Call transferred to front desk to schedule appt with Dr Raylene Miyamoto for eval.

## 2011-07-27 NOTE — Progress Notes (Signed)
Loretta Martin    OUTPATIENT EVALUATION    REFERRING PROVIDER: Alton Revere     Hx OF PRESENT ILLNESS: 50 y.o female with c/o chronic low back pain for 3 years. No hx of injuries, pain was of  insidious onset. Pain is worse with unsupported sitting, lifting and bending. Better with supported sitting and with laying down. No c/o radiating pain, no numbness or tingling. Pt referred to PT for evaluation.   Occupation: housekeeper       PRECAUTIONS: n/a    MEDICATIONS (Rx Comments, concerns): For a list of current medications review the Medication activity.     LEARNS BEST: demo   MENTAL STATUS/COMMUNICATION: WNL     PRIMARY LANGUAGE: english      REQUIRES INTERPRETER: No   INTERPRETER PRESENT DURING EVAL: No    DRESSING/GROOMING: WNL      DRIVING: Pain with prolonged driving.      SLEEPING: WNL     POSTURE/ALIGNMENT:marked elevated R UT and shoulder girdle. Normal lumbar Lordorsis, PSIS, ASIS, and iliac crest aligned.        Lumbar AROM:   FB: 80 deg WNl pain  BB: WNL  SB: L 20 deg but no curve in upper lumbar/lower T spine, R 20 deg with curve  ROT: WNL B each side        NEUROLOGICAL: WNL      PALPATION: No TTP,  increased lumbar paraspinal muscle tone   JOINT PLAY: increased pain to R sided lumbar PA's.   PIVMS; restricted to L SB from L4- T9-8, R SB WNL  Flexion PIVMS:  WNL    GAIT: no gross deviations noted.      SKIN INTEGRITY: WNL        Please Note: Only populated fields were assessed by provider, fields left blank were not assessed.    GIRTH LEFT RIGHT GIRTH LEFT RIGHT GIRTH LEFT RIGHT                           OTHER:     USE IMAGES ACTIVITY. IMAGE AVAILABLE: No      LEFT RIGHT  LEFT RIGHT   SHOULD A/PROM MMT A/PROM MMT HIP A/PROM MMT A/PROM MMT   FLEX.     FLEX. All wfl All 5 All wfl All 5   EXT.     EXT.       IR     IR       ER     ER       ABD     ABD       H. ABD     ADD       H. ADD     KNEE       ELBOW     FLEX       FLEX.     EXT       EXT.     ANKLE       WRIST     DF       FLEX.     PF       EXT.     IV        SUP/  PRON     EV          SPECIAL TEST LEFT RIGHT SPECIAL TEST LEFT RIGHT SPECIAL TEST LEFT RIGHT   SLR   Active SLR   Prone instability     slump - - SI distraction   Lumbar  traction     Repeated extension   sI provocation          Physical Therapy Plan of Care    BJ:YNWGN Chilton Si, MD    Referring Provider: Alton Revere  Diagnosis: low back pain.     Assessment/Objective Findings: 50 y/o female with chronic low back pain, worse over the past few months and presents with the following: decreased lumbar and lower T-spine joint mobility, decreased LE muscle length, limited lumbar ROM (L SB), increased muscle tone/guaring at paraspinals, pain with bending, lifting and prolonged unsupported sitting. Pt's symptoms are consistent with lumbar and T spine joint restriction and result of poor body mechanics/conditioning. Pt will benefit from skilled PT to address impairments, pt ed on safe lifting and body mechanics to improve functional mobility with decreased pain.           Rehabilitation Goal     Short Term Goals:  Pt will demonstrate equal lumbar ROM with min c/o pain in 4 weeks.   Pt will demonstrate lifting and bending techniques with correct body mechanics with min c/o pain in 3 weeks.   Pt will demonstrate improved lumbar and lower T spine joint mobility with PIVMS and PA's in 4 weeks.   Pt will gain 25% increased LE muscle length in 4 weeks.   Pt will be independent with HEP in 2 weeks.     Long Term Goal:   Pt will perform all functional activites and work tasks with min-no c/o pain in 6 weeks.     Treatment Plan:   ** Stretching/ROM Exercise  ** Therapeutic Exercise  ** Home Exercise Program  ** Joint Mobilization  ** Soft Tissue Mobilization  ** Hot/Cold Rx  ** Manual Traction  ** Gait Training  ** Functional Activities  ** Patient Education    Recommend Physical Therapy be continued 1-2 times per week for 6 weeks.  The rehabilitation potential for this patient is good     Patient is agreeable to treatment plan  and is aware of attendance policy.    Crecencio Mc, PT, DPT

## 2011-07-27 NOTE — Patient Instructions (Signed)
Rehabilitation Treatment Flowsheet    Precautions:    Date: 07/27/11         Initials: CR         Visit #: 1         POC Due Date: 4 weeks         Time: 45         HEP LTR, prayer stretch         Treatment(s) 9/10                Initial eval

## 2011-07-28 NOTE — Progress Notes (Addendum)
I certify that the documented Treatment Plan is reasonable and necessary.    07/28/2011  Arlice Colt, MD

## 2011-07-28 NOTE — Progress Notes (Addendum)
I certify that the documented Treatment Plan is reasonable and necessary.    07/28/2011  Erdem Naas MD

## 2011-08-03 ENCOUNTER — Ambulatory Visit (HOSPITAL_BASED_OUTPATIENT_CLINIC_OR_DEPARTMENT_OTHER): Payer: No Typology Code available for payment source | Admitting: Rehabilitative and Restorative Service Providers"

## 2011-08-10 ENCOUNTER — Ambulatory Visit (HOSPITAL_BASED_OUTPATIENT_CLINIC_OR_DEPARTMENT_OTHER): Payer: No Typology Code available for payment source | Admitting: Rehabilitative and Restorative Service Providers"

## 2011-08-10 DIAGNOSIS — M549 Dorsalgia, unspecified: Secondary | ICD-10-CM

## 2011-08-10 NOTE — Progress Notes (Signed)
.    S: pt reports feeling 5/10 pain, states that changing her body mechanics with work has helped some.   O: see PT flowsheet.   A: Pt with marked increased lumbar paraspinal muscle tone, Pt with no pain with lumbar R and L sided PA's. No stiffness noted with lumbar PA's. Pt with increased pain with FB, continues with limited B SB. Pt's pain is mostly due to myofacial restriction, continue stretching and will address correct lifting techniques next visit.   P: continue per POC

## 2011-08-10 NOTE — Patient Instructions (Addendum)
Rehabilitation Treatment Flowsheet    Precautions:    Date: 07/27/11 08/10/11        Initials: CR CR        Visit #: 1 2        POC Due Date: 4 weeks         Time: 45 30 min (+10 min MHP)        HEP LTR, prayer stretch         Treatment(s) 9/10 5/10               Initial eval  Bike 5 min Review lifting tehniques               STM to lumbar paraspinals x 10 min                MHP x 10 min                Prayer stretch midline L and R x 30 sec ea                 bridging 3 x 10                FT rows seated on Pball 50# 2 x 15

## 2011-08-12 ENCOUNTER — Ambulatory Visit (HOSPITAL_BASED_OUTPATIENT_CLINIC_OR_DEPARTMENT_OTHER): Payer: No Typology Code available for payment source

## 2011-08-12 NOTE — Telephone Encounter (Signed)
Seen in clinic

## 2011-08-17 ENCOUNTER — Ambulatory Visit (HOSPITAL_BASED_OUTPATIENT_CLINIC_OR_DEPARTMENT_OTHER): Payer: No Typology Code available for payment source | Admitting: Rehabilitative and Restorative Service Providers"

## 2011-08-17 DIAGNOSIS — M549 Dorsalgia, unspecified: Secondary | ICD-10-CM

## 2011-08-17 NOTE — Progress Notes (Signed)
.    S: pt reports feeling better and with less pain than last PT session, however still states 7/10 pain.   O: see PT flowsheet.   A: Pt with decreased lumbar paraspinal tension and guarding, L side continues to be increased vs R. Tolerated exercises well. No increased c/o pain. Reviewed and gave pt education on lifting and vacuuming techniques to avoid improper body mechanics at work. Pt with good demonstration of techniques.   P: continue per POC

## 2011-08-17 NOTE — Patient Instructions (Addendum)
Rehabilitation Treatment Flowsheet    Precautions:    Date: 07/27/11 08/10/11 08/17/11       Initials: CR CR CR       Visit #: 1 2 3        POC Due Date: 4 weeks         Time: 45 30 min (+10 min MHP) 30 min (10 min MHP)       HEP LTR, prayer stretch         Treatment(s) 9/10 5/10 7/10              Initial eval  Bike 5 min Pt ed on lifting tehniquesx 10 min               STM to lumbar paraspinals x 10 min x               MHP x 10 min x               Prayer stretch midline L and R x 30 sec ea X  2 x 30 sec ea                bridging 3 x 10 x               FT rows seated on Pball 50# 2 x 15 x                FT CGPD  65#  2 x 15

## 2011-08-18 ENCOUNTER — Ambulatory Visit (HOSPITAL_BASED_OUTPATIENT_CLINIC_OR_DEPARTMENT_OTHER): Payer: No Typology Code available for payment source | Admitting: Internal Medicine

## 2011-08-18 ENCOUNTER — Encounter (HOSPITAL_BASED_OUTPATIENT_CLINIC_OR_DEPARTMENT_OTHER): Payer: Self-pay | Admitting: Internal Medicine

## 2011-08-18 VITALS — BP 108/72 | HR 92 | Temp 97.9°F | Ht 62.0 in | Wt 145.0 lb

## 2011-08-18 DIAGNOSIS — M549 Dorsalgia, unspecified: Secondary | ICD-10-CM

## 2011-08-18 DIAGNOSIS — Z7189 Other specified counseling: Secondary | ICD-10-CM

## 2011-08-18 DIAGNOSIS — R05 Cough: Secondary | ICD-10-CM

## 2011-08-18 DIAGNOSIS — F3289 Other specified depressive episodes: Secondary | ICD-10-CM

## 2011-08-18 DIAGNOSIS — F329 Major depressive disorder, single episode, unspecified: Secondary | ICD-10-CM

## 2011-08-18 DIAGNOSIS — M79641 Pain in right hand: Secondary | ICD-10-CM

## 2011-08-18 DIAGNOSIS — R059 Cough, unspecified: Secondary | ICD-10-CM

## 2011-08-18 DIAGNOSIS — E559 Vitamin D deficiency, unspecified: Secondary | ICD-10-CM

## 2011-08-18 MED ORDER — GABAPENTIN 300 MG PO CAPS
300.0000 mg | ORAL_CAPSULE | Freq: Two times a day (BID) | ORAL | Status: DC
Start: 2011-08-18 — End: 2012-08-21

## 2011-08-18 MED ORDER — IBUPROFEN 800 MG PO TABS
800.0000 mg | ORAL_TABLET | Freq: Three times a day (TID) | ORAL | Status: DC | PRN
Start: 2011-08-18 — End: 2011-09-29

## 2011-08-18 MED ORDER — VITAMIN D 400 UNITS PO TABS
400.0000 [IU] | ORAL_TABLET | Freq: Every day | ORAL | Status: DC
Start: 2011-08-18 — End: 2012-10-23

## 2011-08-18 MED ORDER — FLUTICASONE PROPIONATE 50 MCG/ACT NA SUSP
2.0000 | Freq: Every day | NASAL | Status: DC
Start: 2011-08-18 — End: 2013-01-12

## 2011-08-18 NOTE — Progress Notes (Signed)
Loretta Martin is a 50 year old female who presents for follow up of ongoing back pain and depression.  She has been doing a little better, but she now notes pain in her right hand which is exacerbated by work.  It is difficult for her to work with other hand.  She wants "blood test to be sure it isn't arthritis" but I explained that arthritis encompasses many diseases.  No swelling now, and pain less presnt during the day and when not working. on dorsal side over metatarsals. Right hand only. Pain worsened with grasp, no exacerbation with movement of fingers or wrist. No swelling or erythema. Denies numbness.   ongoing cough    We discussed health care proxy and getting vitamin D in diet    Updated much of hx.    OBJECTIVE:  BP 108/72   Pulse 92   Temp(Src) 97.9 F (36.6 C) (Oral)   Ht 5\' 2"  (1.575 m)   Wt 145 lb (65.772 kg)   BMI 26.52 kg/m2   SpO2 95%   LMP 11/02/2009   Pleasant woman NAD  Hand exam - both sides normal, full range of motion of all joints, no swelling, tenderness or deformities. There is normal motor, sensory, vascular and tendon function.    729.5EX Hand pain, right  Comment: counseled re rest and use of NSAIDS  Plan: ibuprofen (ADVIL,MOTRIN) 800 MG tablet    311 DEPRESSIVE DISORDER NEC  Comment: renewed med  Plan: gabapentin (NEURONTIN) 300 MG capsule    724.5E Back pain  Comment: as above  Plan: gabapentin (NEURONTIN) 300 MG capsule    786.2 Cough  Comment: renewed med  Plan: fluticasone (FLONASE) 50 MCG/ACT nasal spray    V65.49AJ ACP (advance care planning)  Plan: HEALTH CARE PROXY    268.9G Vitamin d deficiency  Comment: meds renewed  Plan: cholecalciferol (VITAMIN D3) 400 UNIT tablet

## 2011-09-02 ENCOUNTER — Ambulatory Visit (HOSPITAL_BASED_OUTPATIENT_CLINIC_OR_DEPARTMENT_OTHER): Payer: No Typology Code available for payment source | Admitting: Rehabilitative and Restorative Service Providers"

## 2011-09-02 DIAGNOSIS — M549 Dorsalgia, unspecified: Secondary | ICD-10-CM

## 2011-09-02 NOTE — Progress Notes (Signed)
.    S: pt reports feeling increased pain over the past few days, 9 /10 now 8/10. Pt does not recall doing any different activities.    O: see PT flowsheet.   A: Pt with increased lumbar paraspinal muscle tension, decreased moderately with STM and myofascial release. Pt with fair tolerance for exercises, some VC's to complete with correct form. Pt with decreased lumbar SB 2/2 myofascial restriction.   P: continue per POC

## 2011-09-02 NOTE — Patient Instructions (Signed)
Rehabilitation Treatment Flowsheet    Precautions:    Date: 07/27/11 08/10/11 08/17/11 09/02/11      Initials: CR CR CR CR      Visit #: 1 2 3 4       POC Due Date: 4 weeks         Time: 45 30 min (+10 min MHP) 30 min (10 min MHP) 30 min ( 10 min MHP)       HEP LTR, prayer stretch         Treatment(s) 9/10 5/10 7/10 8/10             Initial eval  Bike 5 min Pt ed on lifting tehniquesx 10 min resistive jackknives 3 x 10               STM to lumbar paraspinals x 10 min x x              MHP x 10 min x x              Prayer stretch midline L and R x 30 sec ea X  2 x 30 sec ea X R L and neutral x 30 sec ea               bridging 3 x 10 x X  On ball 3 x 10               FT rows seated on Pball 50# 2 x 15 x X  3 x 10               FT CGPD  65#  2 x 15 x

## 2011-09-07 ENCOUNTER — Telehealth (HOSPITAL_BASED_OUTPATIENT_CLINIC_OR_DEPARTMENT_OTHER): Payer: Self-pay | Admitting: Internal Medicine

## 2011-09-07 DIAGNOSIS — M545 Low back pain, unspecified: Secondary | ICD-10-CM

## 2011-09-07 DIAGNOSIS — G8929 Other chronic pain: Secondary | ICD-10-CM

## 2011-09-07 NOTE — Telephone Encounter (Signed)
Message copied by Otilio Saber on Tue Sep 07, 2011 11:02 AM  ------       Message from: Tanna Savoy       Created: Tue Sep 07, 2011  8:30 AM       Regarding: FW: referral       Contact: [phone]         mo       ----- Message -----          From: Elvina Mattes          Sent: 09/07/2011   7:58 AM            To: Tanna Savoy       Subject: RE: referral                                               This goes to Erhard Senske              ----- Message -----          From: Tanna Savoy          Sent: 09/06/2011   4:13 PM            To: Elvina Mattes       Subject: referral                                                   Loretta Martin is a 50 year old old female.              Patient's PCP: Arlice Colt, MD              In case we get disconnected what is the best number to reach you at today:       Home Phone [phone] (home)              Person calling:       Patient (self)              How can I help you today:        Requesting a Referral:         Have you discussed the need for a referral with your PCP? No       What is the reason you need to see this specialist (diagnosis)? Lower back pain       What specialty do you need a referral for? chiroprater        What is the specialist's name? Onalee Hua shabrick       What is the specialist's address(hospital or facility name)? [address] [place], [place] [address]       What is the specialist's phone number? [phone]       What is the specialist's fax number? [phone]       What is the specialist's NPI number? [id number]       When is your appointment (date/time)? 09-06-11                       Patient's language of care: [place]              Would you like an interpreter when the nurse calls you back?  YES  [place]

## 2011-09-07 NOTE — Progress Notes (Addendum)
Addended by: HALPERIN, Sharvi Mooneyhan on: 09/07/2011      Modules accepted: Level of Service

## 2011-09-07 NOTE — Telephone Encounter (Signed)
Pt calling for Chiropractor referral

## 2011-09-29 ENCOUNTER — Ambulatory Visit (HOSPITAL_BASED_OUTPATIENT_CLINIC_OR_DEPARTMENT_OTHER): Payer: No Typology Code available for payment source | Admitting: Internal Medicine

## 2011-09-29 ENCOUNTER — Encounter (HOSPITAL_BASED_OUTPATIENT_CLINIC_OR_DEPARTMENT_OTHER): Payer: Self-pay | Admitting: Internal Medicine

## 2011-09-29 VITALS — BP 118/80 | HR 98 | Temp 99.1°F | Wt 147.0 lb

## 2011-09-29 DIAGNOSIS — IMO0002 Reserved for concepts with insufficient information to code with codable children: Secondary | ICD-10-CM

## 2011-09-29 MED ORDER — DICLOFENAC POTASSIUM 50 MG PO TABS
50.00 mg | ORAL_TABLET | Freq: Three times a day (TID) | ORAL | Status: AC
Start: 2011-09-29 — End: 2011-10-09

## 2011-09-29 MED ORDER — CYCLOBENZAPRINE HCL 10 MG PO TABS
10.0000 mg | ORAL_TABLET | Freq: Three times a day (TID) | ORAL | Status: DC | PRN
Start: 2011-09-29 — End: 2011-10-20

## 2011-09-29 NOTE — Progress Notes (Signed)
SUBJECTIVE:  Loretta Martin is a 50 year old female who presents with left sided shoulder and arm pain beginning 1 week ago. Mechanism of injury: No remembered injury.  No trauma.  Woke up with pain. At back of neck which then spread to shoulder. Parasthesias- None, weakness-Yes reports this as some shaking at the fingers of the left hand. Symptoms have been unchanged since onset.   Used tylenol, motrin aleve, and Ice without benefit.    Prior history of related problems: no prior problems with tshoulder, but has been having pain at right hand and chronic neck pain.    OBJECTIVE:  Vital signs as noted above.  Appearance: in no apparent distress.  Shoulder exam: normal exam, no swelling, instability; ligaments intact, FROM shoulder joint.  She reports tenderness diffusely, but when focusing appears to be at rotator cuff muscles and not joints.    840.9AD Shoulder sprain or strain  (primary encounter diagnosis)  Comment: educated to not use multiple NSAIDS at the same time, but willing to switch to Voltaren.  Has had adverse reaction to opioids, and does not take these when prescribed  Plan: diclofenac (VOLTAREN) 50 MG tablet,         cyclobenzaprine (FLEXERIL) 10 MG tablet

## 2011-09-30 ENCOUNTER — Ambulatory Visit (HOSPITAL_BASED_OUTPATIENT_CLINIC_OR_DEPARTMENT_OTHER): Payer: No Typology Code available for payment source | Admitting: Obstetrics & Gynecology

## 2011-10-18 ENCOUNTER — Telehealth (HOSPITAL_BASED_OUTPATIENT_CLINIC_OR_DEPARTMENT_OTHER): Payer: Self-pay

## 2011-10-18 NOTE — Telephone Encounter (Signed)
Call to patient,lm to call clinic and speak with rn.

## 2011-10-18 NOTE — Telephone Encounter (Signed)
Message copied by Sharlyne Pacas on Mon Oct 18, 2011  3:07 PM  ------       Message from: Riccardo Dubin       Created: Mon Oct 18, 2011  2:40 PM       Regarding: Pain on left arm       Contact: [phone]         Nazifa Trinka is a 51 year old old female.              Patient's PCP: Arlice Colt, MD              In case we get disconnected what is the best number to reach you at today:       Home Phone [phone] (home)              Person calling:       Patient (self)              How can I help you today:        Patient said that was seen by Dr. Chilton Si on 09/29/11 and was given medication, but it didn't help, is still having pain on her left arm.              Patient's language of care: [place]              Would you like an interpreter when the nurse calls you back?       NO

## 2011-10-20 ENCOUNTER — Telehealth (HOSPITAL_BASED_OUTPATIENT_CLINIC_OR_DEPARTMENT_OTHER): Payer: Self-pay | Admitting: Internal Medicine

## 2011-10-20 ENCOUNTER — Ambulatory Visit (HOSPITAL_BASED_OUTPATIENT_CLINIC_OR_DEPARTMENT_OTHER): Payer: No Typology Code available for payment source | Admitting: Internal Medicine

## 2011-10-20 ENCOUNTER — Encounter (HOSPITAL_BASED_OUTPATIENT_CLINIC_OR_DEPARTMENT_OTHER): Payer: Self-pay | Admitting: Internal Medicine

## 2011-10-20 VITALS — BP 114/68 | HR 60 | Temp 98.1°F | Ht 61.81 in | Wt 147.0 lb

## 2011-10-20 DIAGNOSIS — M542 Cervicalgia: Secondary | ICD-10-CM

## 2011-10-20 DIAGNOSIS — N950 Postmenopausal bleeding: Secondary | ICD-10-CM

## 2011-10-20 NOTE — Progress Notes (Signed)
Loretta Martin is a 51 year old female who presents with 2 areas of concern.  She had menstrual type bleeding despite being post menopausal.  Last period was 2 years ago.  No cramps, but she saw blood in the toilet when urinating 3 days ago.  Bleeding Stopped today.   Had a little nausea, and felt a little dizzy 1 day.  Also felt some belly swelling.  I reviewed meds with her including the estrogen that a [place] doctor started her on last year, and she eventually states that she was taking "hormones" until 2 weeks ago.  No other abnormal discharge.  No fevers or chills.  No changes in bowels or bladder.    Also reports ongoing neck pain.  Began at left shoulder about 3-4 weeks ago.  Has not noticed much improvement with NSAIDS and muscle relaxants.  She does find massage helps The pain is positional with movement of neck with radiation of pain down the left shoulder. Mechanism of injury: none.  Symptoms have been unchanged since that time.  There is no numbness, tingling, weakness in the arms.    OBJECTIVE:  BP 114/68   Pulse 60   Temp(Src) 98.1 F (36.7 C) (Oral)   Ht 5' 1.81" (1.57 m)   Wt 147 lb (66.679 kg)   BMI 27.05 kg/m2   LMP 11/02/2009   Neck exam: normal C-spine, FROM, tenderness over left trapezial muscles, and when turning neck to left. normal neurological exam of arms; normal motor, sensory exam.  S1 and S2 normal, no murmurs, clicks, gallops or rubs. Regular rate and rhythm. Chest is clear; no wheezes or rales. No edema or JVD.  The abdomen is soft without tenderness, guarding, mass, rebound or organomegaly. Bowel sounds are normal. No CVA tenderness or inguinal adenopathy noted.      627.1 Postmenopausal bleeding  Comment: likely related to unapposed estrogen which she stopped.  Not clear how much surveillance she needs, but does need at least 1 evaluation for possible endometrial cancer  Plan: ORDER FOR ULTRASOUND, REFERRAL TO GYNECOLOGY (         INT)    723.1CD Neck pain on left  side  Comment: likely work connected  Plan: REFERRAL TO PHYSICAL THERAPY ( INT)

## 2011-10-20 NOTE — Telephone Encounter (Signed)
Message copied by Doroteo Glassman on Wed Oct 20, 2011 12:49 PM  ------       Message from: Riccardo Dubin       Created: Wed Oct 20, 2011 12:28 PM       Regarding: Triage       Contact: [phone]         Brayli Klingbeil is a 51 year old old female.              Patient's PCP: Arlice Colt, MD              In case we get disconnected what is the best number to reach you at today:       Home Phone [phone] (home)              Person calling:       Patient (self)              How can I help you today:        Patient said that does not have her period for more 2 years, last Monday her period came again, wants to speak with the nurse to see if this is normal.              Patient's language of care: [place]              Would you like an interpreter when the nurse calls you back?       Yes: [place]

## 2011-10-20 NOTE — Telephone Encounter (Signed)
Call to patient. States that her periods stopped 2 years ago and she was told that she was post menapausal.  States she started bleeding on Monday 1/14 just like she was having a normal period.  States she has been changing her pads at least 3 X per day.  Also says that her abdomen is swollen.  Is concerned about this and is requesting to be seen.  Appt scheduled with Dr Chilton Si at 5:30p today.

## 2011-10-21 ENCOUNTER — Emergency Department (HOSPITAL_BASED_OUTPATIENT_CLINIC_OR_DEPARTMENT_OTHER)
Admission: RE | Admit: 2011-10-21 | Disposition: A | Discharge status: Home / Self Care | Payer: Self-pay | Source: Emergency Department

## 2011-10-21 ENCOUNTER — Encounter (HOSPITAL_BASED_OUTPATIENT_CLINIC_OR_DEPARTMENT_OTHER): Payer: Self-pay

## 2011-10-21 LAB — BLOOD SUGAR FINGERSTICK (POINT OF CARE): FINGERSTICK GLUCOSE: 156 mg/dl (ref 74–160)

## 2011-10-21 MED ORDER — SODIUM CHLORIDE 0.9 % IV BOLUS
1000.00 mL | Freq: Once | INTRAVENOUS | Status: AC
Start: 2011-10-21 — End: 2011-10-21
  Administered 2011-10-21: 1000 mL via INTRAVENOUS

## 2011-10-21 MED ORDER — MECLIZINE HCL 25 MG PO TABS
25.00 mg | ORAL_TABLET | Freq: Two times a day (BID) | ORAL | Status: AC | PRN
Start: 2011-10-21 — End: 2011-11-20

## 2011-10-21 MED ORDER — MECLIZINE HCL 25 MG PO TABS
25.00 mg | ORAL_TABLET | Freq: Once | ORAL | Status: AC
Start: 2011-10-21 — End: 2011-10-21
  Administered 2011-10-21: 25 mg via ORAL
  Filled 2011-10-21: qty 1

## 2011-10-21 NOTE — Discharge Instructions (Signed)
Treated today in the emergency department for symptoms of dizziness, nausea and vomiting.  Currently your vital signs are within normal limits.  Your given IV fluids along with a medication to help with the dizziness symptoms.  You have since improved.  Your electrocardiogram and blood work was normal.  Please continue to take the prescribed medications as directed.  Drink plenty of fluids to stay hydrated.  Take Tylenol every 4 hours and Motrin every 6 hours for any fever and pain.  Please followup with your primary care doctor in 2 days.  Return to the emergency department with any worsening signs or symptoms.

## 2011-10-21 NOTE — ED Notes (Signed)
Pt report given to Janet,RN.

## 2011-10-21 NOTE — ED Triage Note (Signed)
Pt reporting vomiting and dizziness that started yesterday, denies any abdominal pain, diarrhea or fever.

## 2011-10-21 NOTE — ED Notes (Signed)
Patient Disposition    Patient education for diagnosis, medications, activity, diet and follow-up.  Patient left ED 8:53 PM.  Patient received written instructions and prescription for meclizine.  Pt stated that she understood why and how to take meclizine and the importance of follow up.  Interpreter to provide instructions: no    Discharged to: home

## 2011-10-21 NOTE — ED Notes (Signed)
Pt states that she does not feel dizzy when she's lying down, but when she sits up or moves her head to the side - she feels dizzy -

## 2011-10-21 NOTE — ED Provider Notes (Addendum)
The patient was seen in conjunction with emergency department attending physician, Dr. Gildardo Cranker. ED nursing record was reviewed. Prior records as available electronically through the Epic record were reviewed.    HPI:    This 51 year old female patient presents ambulatory to the emergency department tonight with chief complaint of nausea, vomiting and dizziness.  Patient states that last night she had a mild headache and had one bout of vomiting.  She went to sleep and woke up this morning with no headache but had some dizziness and associated nausea.  She did vomit a couple of times during the day and states that it was usually associated with trying to stand up quickly.  She states this is when her dizziness is worse.  When lying down she does not feel rather dizzy at all.  Denies any chest pain, shortness of breath, fever, chills, sore throat, cough or headache currently.  Patient denies any previous cardiac or respiratory issues.  States she does not have history of headaches or migraines.  Patient denies being relatively ill at all lately or being near any sick contacts or having foreign travel.  Currently alert and oriented x3, well-appearing and in no acute distress.      ROS: Pertinent positives were reviewed as per the HPI above. All other systems were reviewed and are negative.      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 - had negative CXR pp at [facility] referred to TB clinic    Nonspecific abnormal results of thyroid function study 08/31/2004    Comment: increased TSH 4.19 6/00; TSH nl 9/00; 05/04/99 - wnl    Urinary tract infection, site not specified 08/31/2004    Comment: 4/02, 6/02    Depressive disorder, not elsewhere classified 08/31/2004    Comment:  10/00 to Psych on Paxil, self d/c'd; 9/00 head CT: scattered punctate calcifications    Asthma     Presbyopia 04/23/2009    PVD (posterior vitreous detachment) 04/23/2009       Patient Active Problem List:     TUBERCULIN TEST REACTION NO TBC [795.5]     DEPRESSIVE DISORDER NEC [311]     MAMMOGRAPHIC MICROCALCIFICATION [793.81]     IBS (Irritable Bowel Syndrome) [564.1U]     DPH-CARE COORDINATION PROGRAM PARTICIPANT [55555]     Chronic Low Back Pain [724.2AG]     Presbyopia [367.4]     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]     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  Current outpatient prescriptions ordered prior to encounter:  gabapentin (NEURONTIN) 300 MG capsule Take 1 capsule by mouth 2 (two) times daily. Disp: 60 capsule Rfl: 6   fluticasone (FLONASE) 50 MCG/ACT nasal spray 2 sprays by Each Nostril route daily. Disp: 48 g Rfl: 3   cholecalciferol (VITAMIN D3) 400 UNIT tablet Take 1 tablet by mouth daily. Disp: 30 tablet Rfl: 12   fluoxetine (PROZAC) 20 MG capsule Take 2 capsules by mouth daily. Disp: 60 capsule Rfl: 12   PROAIR HFA 108 (90 BASE) MCG/ACT inhaler Inhale 2 puffs into the lungs every 4 (four) hours as needed (cough ). Disp: 1 Inhaler Rfl: 11   mometasone (ELOCON) 0.1 % cream Apply  topically 2 (two) times daily. Disp: 45 g Rfl: 2   loratadine (CLARITIN) 10 MG tablet Take 1 tablet by mouth daily. Disp: 30 tablet  Rfl: 6   estradiol (ESTRACE VAGINAL) 0.1 MG/GM vaginal cream Place 2 g vaginally daily. Disp: 42.5 g Rfl: 0   fish oil-omega-3 fatty acids 1000 MG capsule Take 2 capsules by mouth daily. Disp: 30 capsule Rfl:            Social History:   Social History   Marital Status: Divorced  Spouse Name: N/A    Years of Education: N/A  Number of Children: 3     Occupational History  housecleaning       Social History Main Topics   Smoking status: Former Smoker     Quit date: 08/10/2004    Smokeless tobacco:     Comment:     Alcohol Use: Yes    Comment: sporadically    Drug Use: No    Sexually Active: Not Currently  Partner(s): Female    Birth Control/ Protection: Surgical    Comment: men age 2 q mont, menorrhagia 2006, past history of syphillis and rx , 1st SA age 56, 1 lietime partners, separated again from her husband.     Other Topics Concern    Military Service No    Blood Transfusions No    Occupational Exposure No    Hobby Hazards No    Sleep Concern No    Stress Concern No    Weight Concern Yes    Special Diet No    Exercise No    Comment: not much    Seat Belt Yes     Social History Narrative        To [place] from [place] in 2001    Lives with 2 children, 2 older children working    Works English as a second language teacher    Remarried to husband,       Allergies:  Review of Patient's Allergies indicates:   Pcn (penicillins)           Comment:Got a urine infection after using   Acetaminophen w/ hy*    Nausea and Vomiting      Physical Exam:  BP 124/82   Pulse 90   Temp 98 F   Resp 16   Wt 66.679 kg   SpO2 97%   LMP 11/02/2009    GENERAL:  WDWN, no acute distress, non-toxic   SKIN:  Warm & Dry, no rash, no petechia.  HEAD:  NCAT. Sclerae are anicteric and aninjected, oropharynx is clear with moist mucous membranes. PERRL. EOMI. B TMs clear.  No nystagmus elicited however patient reported increased dizziness with extraocular movements.  NECK:  No C-spine tenderness, crepitus, step-off.  No meningismus.  No LAN. No stridor.  Full  passive range of motion.  Negative Kernig's and Brudzinski sign.  LUNGS:  Clear to auscultation bilaterally. No wheezes, rales, rhonchi.   HEART:  RRR.  No murmurs, rubs, or gallops.   ABDOMEN:  Soft, NTND.  No hepatosplenomegaly.  No masses.  No involuntary guarding or rebound.   EXTREMITIES:  No obvious deformities.  Warm and well perfused.  No cyanosis, clubbing, or edema.   GENITOURINARY:  No CVA tenderness.   NEUROLOGIC:  Alert and oriented x4; moves all extremities well; speaking in clear fluent sentences. Normal gait without ataxia; nonfocal. CNsII-XII symmetrical and intact. Sensation intact to light touch throughout. 5/5 strength globally.  Negative Romberg.  Patient reported increased dizziness with rapid hand eye coordination.  PSYCHIATRIC:  Appropriate for age, time of day, and situation        ED Course and Medical Decision-making:  This is a 51 year old female with 24 hours of nausea, vomiting and dizziness.  The patient initially presented slightly hypertensive at 142/103 and a pulse of 110.  Upon reexamination her blood pressure did reduce to 120/80 and heart rate had reduced to 80 as well.  She describes her dizziness more as a room spinning and feeling like being on a boat when she stands up.  At this time it appears to be more of a vestibular neuritis or peripheral vertigo.  We'll treat with 1 liter of normal saline and 25 milligrams of meclizine.  Patient's initial fingerstick blood sugar was within normal limits.  Her electrocardiogram showed a sinus tachycardia 103 beats per minute, normal axis, PR interval of 140, QRS of 80 and QT/QTC of 364/476 milliseconds.  There were no acute ST-T changes.  Patient had orthostatics measured after medications had infused and did not show any signs of orthostatic hypotension.  Patient stated that she felt much better and had no residual dizziness after her medications.  She was ambulated without any difficulty.  Patient was given prescription for meclizine  to take as directed at home.  She will drink plenty fluids to stay hydrated.  She is to followup with her primary care doctor in 2 days. Reasons to return to the ED were reviewed in detail. The patient agrees with this plan and disposition.      Condition on Discharge: Improved and Stable      Diagnosis/Diagnoses:  780.4A Dizziness  (primary encounter diagnosis)    Donella Stade. Jodene Nam    Attestation    This 51 year old female patient was evaluated in the ED for vomiting and dizziness.  Initial history and physical exam information was obtained by PA Willette Pa, who also made a detailed record of this visit. I performed a history and physical examination of the patient and discussed management with him. I reviewed Tony's note and agree with the documented findings and plan of care.    I reviewed the patient's past medical history/problem list, past surgical history, medication list, social history and allergies.    Course:  The pt had dizziness, "like the room spinning," whenever she got up, as well as nausea and vomiting, since this morning.  Her neurological exam was normal.  Her EKG was notable only for sinus tachycardia at 103 bpm, and slight long QT of 476 ms.  Pulse ultimately lowered to 80 and she was improved significantly after meclizine and IVF.         Impression: Peripheral vertigo  Plan: Rx for meclizine 25 mg PO  q 4-6hrs prn dizziness    Reasons to return to the ED were reviewed in detail. The patient agrees with this plan and disposition.    Zan Triska E. Samyak Sackmann,MD

## 2011-10-23 ENCOUNTER — Telehealth (HOSPITAL_BASED_OUTPATIENT_CLINIC_OR_DEPARTMENT_OTHER): Payer: Self-pay

## 2011-10-23 NOTE — Telephone Encounter (Signed)
Call placed to patient to follow-up on visit to ED. No answer noted. Voicemail message left for patient to call the clinic to speak with one of the nurses.

## 2011-10-25 LAB — EKG

## 2011-10-26 ENCOUNTER — Ambulatory Visit: Payer: Self-pay | Admitting: Internal Medicine

## 2011-10-26 LAB — US TRANSVAGINAL NON-OB

## 2011-10-26 LAB — US PELVIC NON-PREGNANT

## 2011-10-26 LAB — US 3D RENDERING WO DEDICATED WORKSTATION

## 2011-11-02 NOTE — Telephone Encounter (Signed)
Since seen in the office

## 2011-11-11 ENCOUNTER — Ambulatory Visit (HOSPITAL_BASED_OUTPATIENT_CLINIC_OR_DEPARTMENT_OTHER): Payer: No Typology Code available for payment source | Admitting: Obstetrics & Gynecology

## 2011-12-03 ENCOUNTER — Ambulatory Visit (HOSPITAL_BASED_OUTPATIENT_CLINIC_OR_DEPARTMENT_OTHER): Payer: No Typology Code available for payment source | Admitting: Obstetrics & Gynecology

## 2012-02-02 ENCOUNTER — Encounter (HOSPITAL_BASED_OUTPATIENT_CLINIC_OR_DEPARTMENT_OTHER): Payer: Self-pay | Admitting: Emergency Medicine

## 2012-02-02 ENCOUNTER — Emergency Department (HOSPITAL_BASED_OUTPATIENT_CLINIC_OR_DEPARTMENT_OTHER)
Admission: RE | Admit: 2012-02-02 | Disposition: A | Discharge status: Home / Self Care | Payer: Self-pay | Source: Emergency Department | Attending: Emergency Medicine | Admitting: Emergency Medicine

## 2012-02-02 MED ORDER — DIAZEPAM 5 MG PO TABS
5.0000 mg | ORAL_TABLET | Freq: Three times a day (TID) | ORAL | Status: AC | PRN
Start: 2012-02-02 — End: 2012-02-06

## 2012-02-02 MED ORDER — IBUPROFEN 600 MG PO TABS
600.00 mg | ORAL_TABLET | Freq: Once | ORAL | Status: AC
Start: 2012-02-02 — End: 2012-02-02
  Administered 2012-02-02: 600 mg via ORAL
  Filled 2012-02-02: qty 1

## 2012-02-02 MED ORDER — IBUPROFEN 600 MG PO TABS
600.00 mg | ORAL_TABLET | Freq: Three times a day (TID) | ORAL | Status: AC | PRN
Start: 2012-02-02 — End: 2012-03-03

## 2012-02-02 NOTE — ED Triage Note (Signed)
PT C/O LEFT SHOULDER PAIN RADIATING DOWN HER SHOULDER. PT CLAIMS PAIN STARTED Friday. PT ADMITS TYLENOL WITHOUT RELIEF. PT WITH LIMITED ROM.

## 2012-02-02 NOTE — ED Notes (Signed)
PT TO ER C/O LEFT SHOULDER PAIN WITH RADIATION DOWN LEFT ARM. PT WITH LIMITED ROM. PT HAND GRASP AND STRENGTH IN LEFT ARM SLIGHTLY WEAKER THAN RIGHT. +RADIAL PULSE. PT ADMITS PAIN ON MOVEMENT. PT CLAIMS TYLENOL HAD NO EFFECT. PT RESTING ON STRETCHER.

## 2012-02-02 NOTE — ED Notes (Signed)
PT D/C TO HOME. D/C INSTRUCTIONS GIVEN AND VERBALLY UNDERSTOOD. PT LEFT ER AMBULATORY WITH STEADY GAIT.

## 2012-02-02 NOTE — ED Notes (Signed)
Patient Disposition    Patient education for diagnosis, medications, activity, diet and follow-up.  Patient left ED 4:51 PM.  Patient rep received written instructions.  Interpreter to provide instructions: No    Discharged to: Discharged to home

## 2012-02-02 NOTE — Discharge Instructions (Signed)
Use ibuprofen as needed for pain from neck strain and pinched nerve.  Valium will help with spasm but will probably make you sleepy.   You can try a heating pad or warm shower.  You may also benefit from heat and/or massage.

## 2012-02-02 NOTE — ED Provider Notes (Signed)
I have reviewed the ED nursing notes and prior records. I have reviewed the patient's past medical history/problem list, allergies, social history and medication list.  I saw this patient primarily.      HPI:  This 51 year old female patient presents with chief complaint of five days of gradually worsening and now severe burning pain in the left posterior neck, radiating down the left upper extremity.  Pain is constant, somewhat worse with movement.  No relief with Tylenol or massage.  She does remember any specific injuries.  No numbness or weakness in the arms.  No chest pain, shortness of breath, no palpitations, no nausea or vomiting.  No fevers or chills, no cough.    ROS: Pertinent positives were reviewed as per the HPI above. All other systems were reviewed and are negative.    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 - had negative CXR pp at [facility] referred to TB clinic    Nonspecific abnormal results of thyroid function study 08/31/2004    Comment: increased TSH 4.19 6/00; TSH nl 9/00; 05/04/99 - wnl    Urinary tract infection, site not specified 08/31/2004    Comment: 4/02, 6/02    Depressive disorder, not elsewhere classified 08/31/2004    Comment: 10/00 to Psych on Paxil, self d/c'd; 9/00 head CT: scattered punctate calcifications    Asthma     Presbyopia 04/23/2009    PVD (posterior vitreous detachment) 04/23/2009       Patient Active Problem List:     TUBERCULIN TEST REACTION NO TBC [795.5]     DEPRESSIVE DISORDER NEC [311]     MAMMOGRAPHIC MICROCALCIFICATION [793.81]     IBS (Irritable Bowel Syndrome) [564.1U]     DPH-CARE COORDINATION PROGRAM PARTICIPANT [55555]     Chronic Low Back Pain [724.2AG]     Presbyopia [367.4]     PVD (Posterior Vitreous  Detachment) [379.21Q]     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  Current outpatient prescriptions ordered prior to encounter:  gabapentin (NEURONTIN) 300 MG capsule Take 1 capsule by mouth 2 (two) times daily. Disp: 60 capsule Rfl: 6   fluticasone (FLONASE) 50 MCG/ACT nasal spray 2 sprays by Each Nostril route daily. Disp: 48 g Rfl: 3   cholecalciferol (VITAMIN D3) 400 UNIT tablet Take 1 tablet by mouth daily. Disp: 30 tablet Rfl: 12   mometasone (ELOCON) 0.1 % cream Apply  topically 2 (two) times daily.  Disp: 45 g Rfl: 2   estradiol (ESTRACE VAGINAL) 0.1 MG/GM vaginal cream Place 2 g vaginally daily. Disp: 42.5 g Rfl: 0   fluoxetine (PROZAC) 20 MG capsule Take 2 capsules by mouth daily. Disp: 60 capsule Rfl: 12   PROAIR HFA 108 (90 BASE) MCG/ACT inhaler Inhale 2 puffs into the lungs every 4 (four) hours as needed (cough ). Disp: 1 Inhaler Rfl: 11   fish oil-omega-3 fatty acids 1000 MG capsule Take 2 capsules by mouth daily. Disp: 30 capsule Rfl:          Social History:     Smoking status: Former Smoker     Quit date: 08/10/2004    Smokeless tobacco:     Comment:     Alcohol Use: Yes    Comment: sporadically       Allergies:  Review of Patient's Allergies indicates:   Pcn (penicillins)           Comment:Got a urine infection after using   Acetaminophen w/ hy*    Nausea and Vomiting    Physical Exam:  BP 127/95   Pulse 104   Temp 98.1  F   Resp 18   Wt 63.504 kg   SpO2 97%   LMP 11/02/2009  GENERAL:  Well-appearing, no distress.  SKIN:  Warm & Dry, no rash, no bruising.  HEAD:  Atraumatic. EOMI. Anicteric sclera. Oropharynx clear.  NECK:  Supple, no midline tender. +L paraspinal spasm and tender, extending partially into trapezius ridge.  LUNGS:  Clear to auscultation bilaterally without rales, rhonchi or wheezing.   HEART:  RRR.  No murmurs, rubs, or gallops.   ABDOMEN:  Soft, flat, without distension.  Nontender to palpation.   MUSCULOSKELETAL:  No deformities, nontender. Well-perfused extremities. No cyanosis or edema.  NEUROLOGIC:  Normal speech.  alert & oriented x 3, MAE. No pronator drift, sensation grossly intact. Gait normal.  PSYCHIATRIC:  Normal affect    ED Course and Medical Decision-making:    The patient is 51 year old female with neck and left arm pain, exam consistent with cervical strain/spasm and radiculopathy.  Low suspicion for cardiac or pulmonary etiology of symptoms.  Patient given ibuprofen while awaiting results of electrocardiogram.    EKG: NSR at 77. Nl axis and itnervals. Nl QRS/ST/T  Patient reports some improvement after ibuprofen.  Will discharge with prescription for ibuprofen and for Valium, advise patient to follow up with PCP.  Reasons to return to the ED were reviewed in detail. The patient agrees with this plan and disposition.    Disposition:  Discharged to home    Condition on  Discharge:  Improved and Stable    Diagnosis/Diagnoses:  847.0P Cervical strain  729.2CU Radiculopathy      Everlene Farrier, MD

## 2012-02-04 ENCOUNTER — Telehealth (HOSPITAL_BASED_OUTPATIENT_CLINIC_OR_DEPARTMENT_OTHER): Payer: Self-pay | Admitting: Internal Medicine

## 2012-02-04 LAB — EKG

## 2012-02-04 NOTE — Telephone Encounter (Signed)
Call patient to schedule a follow up appointment ,patient do not answer the phone left a message on her answer machine to call the office to schedule her appointment.

## 2012-02-08 NOTE — Telephone Encounter (Signed)
Message copied by Windell Moulding on Tue Feb 08, 2012  4:05 PM  ------       Message from: Joneen Roach       Created: Tue Feb 08, 2012  3:56 PM       Regarding: cervical strain       Contact: [phone]         Loretta Martin is a 51 year old old female.              Patient's PCP: Arlice Colt, MD              In case we get disconnected what is the best number to reach you at today:       Home Phone [phone] (home)              Person calling:       Patient (self)              How can I help you today:        Other        Went to [facility] on 02/02/2012 for Cervical strain, patient states she is still in a lot pain want to be seen.       Patient's language of care: [place]              Would you like an interpreter when the nurse calls you back?       NO

## 2012-02-08 NOTE — Telephone Encounter (Signed)
Patient agreed to plan- scheduled

## 2012-02-08 NOTE — Telephone Encounter (Signed)
Can come see me tomorrow at 1:50 OR 2 :50

## 2012-02-08 NOTE — Telephone Encounter (Signed)
Spoke with pt. States taking ibuprofen since returning from [place] 800mg  is not helping. Seen in ER 02/02/12 offered appt Friday asking to be seen by pcp asap. Can come in any time tomorrow-will discuss with pcp and get back to her

## 2012-02-09 ENCOUNTER — Ambulatory Visit (HOSPITAL_BASED_OUTPATIENT_CLINIC_OR_DEPARTMENT_OTHER): Payer: No Typology Code available for payment source | Admitting: Internal Medicine

## 2012-02-16 ENCOUNTER — Ambulatory Visit (HOSPITAL_BASED_OUTPATIENT_CLINIC_OR_DEPARTMENT_OTHER): Payer: No Typology Code available for payment source | Admitting: Internal Medicine

## 2012-03-08 ENCOUNTER — Ambulatory Visit: Payer: Self-pay | Admitting: Internal Medicine

## 2012-03-13 ENCOUNTER — Ambulatory Visit: Payer: Self-pay | Admitting: Internal Medicine

## 2012-03-13 ENCOUNTER — Ambulatory Visit (HOSPITAL_BASED_OUTPATIENT_CLINIC_OR_DEPARTMENT_OTHER): Payer: No Typology Code available for payment source | Admitting: Internal Medicine

## 2012-03-13 ENCOUNTER — Encounter (HOSPITAL_BASED_OUTPATIENT_CLINIC_OR_DEPARTMENT_OTHER): Payer: Self-pay | Admitting: Internal Medicine

## 2012-03-13 VITALS — BP 120/80 | HR 84 | Temp 98.1°F | Wt 143.0 lb

## 2012-03-13 DIAGNOSIS — M25512 Pain in left shoulder: Secondary | ICD-10-CM

## 2012-03-13 DIAGNOSIS — M5412 Radiculopathy, cervical region: Secondary | ICD-10-CM

## 2012-03-13 LAB — MA SCREENING MAMMO BILATERAL WITH CAD

## 2012-03-13 MED ORDER — NAPROXEN 500 MG PO TABS
500.0000 mg | ORAL_TABLET | Freq: Two times a day (BID) | ORAL | Status: DC
Start: 2012-03-13 — End: 2012-04-12

## 2012-03-13 NOTE — Progress Notes (Signed)
SUBJECTIVE:    [name] is a 51 y.o who presents to clinic with c/c of left shoulder pain, posterior neck pain x 2 months     Reports onset of posterior neck pain with radiation of pain down her left shoulder while visiting [place]. Denies any triggers, no fall, trauma. Woke up one morning with this pain. Left arm feels weak with lifting.+ numbness of her left 5th finger.  Has taken Motrin, Tylenol with little effect. Describes the past as a pulsing sensation.     Denies fevers, chills, body aches, chest pain, SOB, n/v, abdominal pain, tingling, change in bowel and urinary patterns     OBJECTIVE:  General: A+0x3,NAD  BP 120/80   Pulse 84   Temp(Src) 98.1 F (36.7 C) (Oral)   Wt 143 lb (64.864 kg)   BMI 26.32 kg/m2   SpO2 96%   LMP 11/02/2009  HEENT: NT/AT PERRLA,  NECK: supple, no nodules, negative lymphadenopathy. + posterior neck pain on palpation, + firm. No rigidity   CHEST: Clear to auscultation, no wheezes or rales.  CV: RRR, S1, S2 WNL, no murmurs  Shoulder exam -  bilateral normal; full range of motion, no pain on motion, no tenderness or deformity noted.  Neuro: CN II-XII grossly intact, steady gait, normal reflexes      ASSESSMENT/PLAN  (723.4) Cervical radiculopathy  (primary encounter diagnosis)  Comment: will refer to PT  Naprosyn prn  Plan: naproxen (NAPROSYN) 500 MG tablet, ORDER FOR         GENERAL X-RAY, REFERRAL TO PHYSICAL THERAPY (         INT)          (719.41) Left shoulder pain  Comment: as above  Plan: naproxen (NAPROSYN) 500 MG tablet, ORDER FOR         GENERAL X-RAY, REFERRAL TO PHYSICAL THERAPY (         INT)              I have spent 25 minutes in face to face time with this patient/patient proxy of which > 50% was in counseling or coordination of care regarding above issues/Dx.

## 2012-03-14 LAB — XR SHOULDER LEFT MINIMUM 2 VIEWS

## 2012-03-14 LAB — XR CERVICAL SPINE WITH OBLIQUES 5 VIEWS

## 2012-03-15 ENCOUNTER — Telehealth (HOSPITAL_BASED_OUTPATIENT_CLINIC_OR_DEPARTMENT_OTHER): Payer: Self-pay | Admitting: Internal Medicine

## 2012-03-15 NOTE — Progress Notes (Addendum)
Call made to pt to discuss x ray results    Cervical x ray: Mild degenerative changes of the cervical spine extending from   C3-C4 to C5-C6 levels with a disc space narrowing and marginal   osteophytes.   2. Mild reversal of the normal cervical lordosis which may be due to   positioning or muscle spasm.     Left Shoulder x ray: Mild degenerative changes with acromioclavicular joint   space narrowing.     Left message on pt voicemail. Awaiting call back

## 2012-03-16 NOTE — Progress Notes (Addendum)
[name] - returning phone call to [place] ','<<< Less Detail          returning phone call to [place]  [name]    [name]  MRN: [id number] DOB: 1961/05/12      Pt Work: [phone] Pt Home: [phone]    Sent: Thu March 16, 2012 8:33 AM   Entered: [phone]      To: P Shpc Rn'S                                                 Message              Loretta Martin [id number], 51 year old, female, Telephone Information:    Home Phone [phone]    Work Phone [phone]    Mobile [phone]    Home Phone [phone]    Work Phone [phone]            Patient's Preferred Pharmacy:         [facility] - [address] [place], [place] - [address]    Phone: [phone] Fax: [phone]            CONFIRMED TODAY: Yes        CALL BACK NUMBER: [phone]    Best time to call back:     Cell phone:     Other phone:        Available times:        Patient's language of care: English        Patient does not need an interpreter.        Patient's PCP: Arlice Colt, MD        Person calling on behalf of patient: Patient (self)        Calls today for test result(s).    Returning phonecall            Patient is calling back to speak to [place] regarding results.       6/13 13  849 am  Called pt,  Results given to pt  She is asking what the plan is  Regarding these results,  Message sent to PCP and  C. [place]

## 2012-03-17 DIAGNOSIS — M5412 Radiculopathy, cervical region: Secondary | ICD-10-CM | POA: Insufficient documentation

## 2012-03-17 NOTE — Telephone Encounter (Signed)
Message copied by Doroteo Glassman on Fri Mar 17, 2012  3:06 PM  ------       Message from: Riccardo Dubin       Created: Fri Mar 17, 2012  2:58 PM       Regarding: Returning phone call.       Contact: [phone]         Chinyere Galiano is a 51 year old old female.              Patient's PCP: Arlice Colt, MD              In case we get disconnected what is the best number to reach you at today:       Home Phone [phone] (home)              Person calling:       Patient (self)              How can I help you today:        Patient is returning Montreal Jordan's phone call.              Patient's language of care: [place]              Would you like an interpreter when the nurse calls you back?       No                ------

## 2012-03-17 NOTE — Progress Notes (Addendum)
Call made to pt. Left message on pt voicemail. Awaiting call back     PLAN: Please advise pt she will need to see Physical therapy for management

## 2012-03-17 NOTE — Telephone Encounter (Signed)
Message copied by Doroteo Glassman on Fri Mar 17, 2012  3:20 PM  ------       Message from: Tanna Savoy       Created: Fri Mar 17, 2012  3:14 PM       Contact: [phone]         Return your call   ------

## 2012-03-17 NOTE — Progress Notes (Addendum)
Call to patient.  Unavailable.  Left VM message for her to call the nurse back.    Patient calling back.  Gave her message below from CJ.  Expressed verbal agreement with plan.

## 2012-03-20 NOTE — Progress Notes (Signed)
Call made to pt. Left message, I also see pt has been spoke to with plan and also results. Advised pt to call clinic with any other questions from this provider, otherwise will close this encounter.

## 2012-04-10 ENCOUNTER — Ambulatory Visit (HOSPITAL_BASED_OUTPATIENT_CLINIC_OR_DEPARTMENT_OTHER): Payer: No Typology Code available for payment source

## 2012-04-10 DIAGNOSIS — M542 Cervicalgia: Secondary | ICD-10-CM

## 2012-04-10 NOTE — Patient Instructions (Signed)
Rehabilitation Treatment Flowsheet    Precautions:    Date: 04/10/12         Initials: DB         Visit #: 1         POC Due Date: 05/11/12         Time: 40         HEP Yes - dispensed         Treatment(s) 5/10                Chin Tucks  10 x 5 sec hold                Levator scap stretch  10 x 5 sec hold                Upper trap stretch  10 x 5 sec hold

## 2012-04-10 NOTE — Progress Notes (Signed)
OUTPATIENT EVALUATION    REFERRING PROVIDER: Arlice Colt  [facility]  [address]  [place], [place] [address]   Hx OF PRESENT ILLNESS: Pt is a 51 year old female referred to Physical Therapy by Dr. Arlice Colt for left sided neck pain as well as pain that radiates into the left hand. Pt reports that her symptoms began approximately two months ago when she was home in [place] visiting her family on vacation. 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 Pt states that currently her discomfort has decreased since it began approximately two months ago. She reports having massages which have helped to significantly decrease her discomfort from a previously reported 10/10 to now a more manageable 5/10. Pt also states that currently she is not having any radicular symptoms, however when she was about a week ago the tingling was down her left upper extremity into her last two fingers ( in the ulnar C8-T1 distrubution). Pt was unable to describe what activities made her discomfort increase or decrease besides the massages.   PRECAUTIONS: None   MEDICATIONS (Rx Comments, concerns): For a list of current medications review the Medication activity.    LEARNS BEST: Pt reports that she learns best through both verbal and written instruction.   MENTAL STATUS/COMMUNICATION: WNL     PRIMARY LANGUAGE: [place]     REQUIRES INTERPRETER: No   INTERPRETER PRESENT DURING EVAL: No   DRESSING/GROOMING: WNL     DRIVING: WNL     SLEEPING: WNL     POSTURE/ALIGNMENT: Fair, moderately rounded shoulders and a forward head position.     MUSCLE LENGTH: moderately decreased left upper trap and levator scapula     NEUROLOGICAL: IMPAIRED: Pt reports that she had previous radicular symptoms in the C8-T1 dermatome on the left UE, UE DTR's equal bilaterally (2/2)     PALPATION: Moderate TTP of the left upper trap, left  levator scapula and left cervical extensors, right upper trap minimally tender.     GAIT: N/A     SKIN INTEGRITY: WNL       Please Note: Only populated fields were assessed by provider, fields left blank were not assessed.    GIRTH LEFT RIGHT GIRTH LEFT RIGHT GIRTH LEFT RIGHT                           CORE STRENGTH: Lower abdominals: 2/5    USE IMAGES ACTIVITY. IMAGE AVAILABLE: No      LEFT RIGHT  LEFT RIGHT   SHOULD A/PROM MMT A/PROM MMT HIP A/PROM MMT A/PROM MMT   FLEX.  -4/5  4/5 FLEX.       EXT.     EXT.       IR     IR       ER     ER       ABD  4/5  4/5 ABD       H. ABD     ADD       H. ADD     KNEE       ELBOW     FLEX       FLEX.  -4/5  4/5 EXT       EXT.  -4/5  4/5 CERVICAL       WRIST     FLEX WNL - reports 5/10  discomfort into left levator scap - no rad sx      FLEX.     EXTEN WNL- reports 5/10 discomfort in left upper trap -  no rad sx      EXT.     SB WNL - 3/10 pain reported into right upper trap  WNL - 5/10 pain reported into left upper trap    SUP/  PRON     ROT WNL  WNL       SPECIAL TEST LEFT RIGHT SPECIAL TEST LEFT RIGHT SPECIAL TEST LEFT RIGHT   Compression/ Distraction of c-spine neg neg Spurling's Test neg neg      Alar Ligament Test neg neg         Vertebral Artery Test neg neg           Physical Therapy Plan of Care    UJ:[id number] Chilton Si, MD  Referring Provider: Arlice Colt, MD  Diagnosis: Neck pain on left side  (primary encounter diagnosis)    Assessment/Objective Findings: Patient is a 51 year old female who presents to physical therapy with left sided neck pain that began approximately two months ago while in vacation in [place]. Pt states that over time her discomfort has gotten progressively better. Pt reports that getting massages by a professional has helped to decrease her discomfort. Pt currently reports also that she is no longer having radicular symptoms into her left hand as she was approximately one week ago. Reports previously having radicular symptoms into her left UE in the C8-T1  dermatome. Upon evaluation pt had increased tenderness with palpation of the left cervical soft tissue, superficially the levator scapula and left upper trap, with slight muscle spasm noted of the left upper trap. All special tests currently negative, not provoking any of pt's reported discomfort.Pt's cervical range of motion all tested with normal limitations, however provoking some discomfort as detailed above. Therefore pt will benefit currently from a good stretching program as well as some strengthening for postural re-education. Pt will meet the following goals:  Goals short term and long term:   1. Pt's upper body and core strength will improve by 1/2 MMT grade, in 2 weeks  2. Pt's symptoms will decrease from 5/10 to a more tolerable 1-2/10 , in 4 weeks.  3. Pt will be independent with her established HEP, in 4 weeks.  4. Pt's upper body and core strength will improve by 1 MMT grade, in 4-6 weeks.  5. Pt's posture will improve from fair to good, in 3-4 weeks.    Treatment Plan: ** Stretching/ROM Exercise  ** Therapeutic Exercise  ** Home Exercise Program  ** Joint Mobilization  ** Soft Tissue Mobilization  ** Electrical Stimulation/TENS  ** Hot/Cold Rx  ** Manual Traction  ** Functional Activities  ** Patient Education    Recommend Physical Therapy be continued 1 times per week for 4 weeks.  The rehabilitation potential for this patient is fair    Patient is agreeable to treatment plan and is aware of attendance policy.    BERGER,DENISE PT, MSPT

## 2012-04-11 NOTE — Progress Notes (Signed)
I certify that the documented Treatment Plan is reasonable and necessary.    04/11/2012  Arlice Colt, MD

## 2012-04-19 ENCOUNTER — Ambulatory Visit (HOSPITAL_BASED_OUTPATIENT_CLINIC_OR_DEPARTMENT_OTHER): Payer: No Typology Code available for payment source

## 2012-04-26 ENCOUNTER — Ambulatory Visit (HOSPITAL_BASED_OUTPATIENT_CLINIC_OR_DEPARTMENT_OTHER): Payer: No Typology Code available for payment source

## 2012-05-01 ENCOUNTER — Ambulatory Visit (HOSPITAL_BASED_OUTPATIENT_CLINIC_OR_DEPARTMENT_OTHER): Payer: No Typology Code available for payment source

## 2012-06-27 ENCOUNTER — Ambulatory Visit (HOSPITAL_BASED_OUTPATIENT_CLINIC_OR_DEPARTMENT_OTHER): Payer: No Typology Code available for payment source | Admitting: Internal Medicine

## 2012-06-27 ENCOUNTER — Encounter (HOSPITAL_BASED_OUTPATIENT_CLINIC_OR_DEPARTMENT_OTHER): Payer: Self-pay | Admitting: Internal Medicine

## 2012-06-27 VITALS — BP 102/68 | HR 87 | Temp 98.0°F | Wt 136.0 lb

## 2012-06-27 DIAGNOSIS — L03031 Cellulitis of right toe: Secondary | ICD-10-CM

## 2012-06-27 DIAGNOSIS — B351 Tinea unguium: Secondary | ICD-10-CM

## 2012-06-27 MED ORDER — MUPIROCIN 2 % EX OINT
TOPICAL_OINTMENT | Freq: Two times a day (BID) | CUTANEOUS | Status: AC
Start: 2012-06-27 — End: 2012-07-07

## 2012-06-27 NOTE — Progress Notes (Signed)
SUBJECTIVE:    Mrs. Kunsman is a 51 y.o who presents to clinic with c/c of toenail problem and pain and swelling of right third toe x 2 weeks    Reports losing her right and left middle toenails these past 2 weeks. Denies trauma to toes. + cracked and thickened toenail of left great toenail.   Also reports having pain and swelling of her right 3rd toe. Denies bleeding, drainage, numbness and tingling of feet  Has applied antibotic spray to her right 3rd toe with little effect. Denies fevers, chills.   No hx of Diabetes     OBJECTIVE:  General: A+0x3,NAD  BP 102/68   Pulse 87   Temp(Src) 98 F (36.7 C) (Oral)   Wt 136 lb (61.689 kg)   BMI 25.03 kg/m2   SpO2 98%   LMP 11/02/2009  Foot exam - RIght 3rd toenail bed with mild swelling and mild erythema noted. No blisters, ulcerations, ecchymoses noted. No  vascular lesions. Color and temperature is normal. Sensation is intact. Peripheral pulses are palpable. Toenails with yellowish hue, both great toenails are thickened.     ASSESSMENT/PLAN  (681.11) Paronychia of third toe, right  (primary encounter diagnosis)  Comment: Epsom salt soaks  Bactroban 2 % ointment BID x 10 days  Advised pt to report continuing or worsening symptoms. Pt agreed to plan.  Plan: mupirocin (BACTROBAN) 2 % ointment            (110.1) Onychomycosis  Comment: findings on exam do appear to be fungal infection of toenails  Will refer to Podiatry  Plan: REFERRAL TO PODIATRY ( INT)

## 2012-07-03 ENCOUNTER — Ambulatory Visit (HOSPITAL_BASED_OUTPATIENT_CLINIC_OR_DEPARTMENT_OTHER): Payer: No Typology Code available for payment source | Admitting: Primary Podiatric Medicine

## 2012-07-03 ENCOUNTER — Ambulatory Visit: Payer: Self-pay | Admitting: Primary Podiatric Medicine

## 2012-07-03 DIAGNOSIS — L03039 Cellulitis of unspecified toe: Secondary | ICD-10-CM

## 2012-07-03 DIAGNOSIS — M79609 Pain in unspecified limb: Secondary | ICD-10-CM

## 2012-07-03 DIAGNOSIS — B351 Tinea unguium: Secondary | ICD-10-CM

## 2012-07-03 LAB — XR FOOT RIGHT MINIMUM 3 VIEWS

## 2012-07-03 NOTE — Progress Notes (Signed)
See dictation

## 2012-07-05 LAB — SURGICAL PATH SPECIMEN

## 2012-07-05 NOTE — Progress Notes (Signed)
July 03, 2012        Loretta Swaziland, NP  [facility]  [address]  [place], [place]  [address]    RE:  Loretta Martin    Dear Loretta Martin:    I had the pleasure of seeing Loretta Martin today for evaluation of toenail fungus as well as paronychia at the right third toe.  This very pleasant 51 year old female patient does describe problems with loss of her middle toenails in the past year or 2 and then more recently some redness and swelling that developed on top of the base of the right third toenail area.  She has been doing the warm water Epsom salt soaks per your instruction, but has not started using the Bactroban ointment that you also prescribed.  On questioning, she does go to a nail salon every 2 weeks and does admit that cuticle areas are generally trimmed and disrupted.  She does also notice some yellow discoloration with thickening, especially at the left great toenail area.  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  She does notice redness and swelling and pain at the top of the right third nail plate area is slightly better than last week.    MEDICAL HISTORY REVIEW:  Depression, irritable bowel syndrome, chronic low back pain, cysticercosis of central nervous system, stress incontinence, vitamin D deficiency, cervical radiculopathy, left-sided neck pain.    MEDICATIONS REVIEW:  Bactroban 2% ointment, Flonase, vitamin D3, Elocon 0.1% cream, estradiol cream, fish oil capsules.    ALLERGIES:  HYDROCODONE-ACETAMINOPHEN causes nausea and vomiting.  PENICILLINS caused secondary urinary tract infection.    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  She quit smoking in 2005.  She does drink alcohol occasionally.    REVIEW OF SYSTEMS:  The patient denies current fever, chills, nausea, vomiting, headache, breathing difficulty, chest pain, night sweats.    PHYSICAL EXAMINATION:  The patient  is alert and oriented and in no acute distress.  She does have yellow nail discoloration with slight thickening and scant distal subungual debris at the distal medial quadrant of the left hallux nail plate that does have clinical appearance of onychomycosis.  No surrounding nail fold edema or erythema at the left great toe.  She does have shortly trimmed right third nail plate with loose exfoliating epidermal skin at the proximal nail fold area with scant local proximal nail fold erythema but no evidence of any active drainage with palpation and no significant pain with deep palpation.  Antiseptic cleansing of the right third nail and periungual tissue followed by gentle debridement of the loose epidermal tissue reveals no active open blister and no production of pus over the proximal nail plate area.  Other nails reveal slight minimal thickening with only very scant yellow discoloration but no subungual debris present.  She does have complete nail plate growth at both third toenails, but there does appear to be some disruption of the nail cuticles at a number of her nail plates including the right third nail area.  Pedal skin in general is warm and supple for age with positive pedal hair growth.  Web spaces are clean, dry, and maceration free.  Dorsalis pedis and posterior tibial pulses are +2/4.  Light touch sensation is intact at both feet.    RADIOLOGY REVIEW:  The patient was sent to x-ray today for radiographs of the right third digit at the distal  phalanx and distal interphalangeal joint just to make sure no evidence of any underlying osseous destruction or osteomyelitis and review of the radiographs does reveal no fracture, osseous erosion or evidence of osteomyelitis at the distal phalanx.  No evidence of any unusual subungual exostosis.    ASSESSMENT:  Clinical onychomycosis at great toenail plates with minimal pain.  Mild paronychia at the proximal nail fold area of the right third digit.    PLAN:  Today,  nail fragments from the more distinct yellow dystrophic nail plate at the left great toe were debrided and collected and sent to pathology for PAS stain to confirm presence of onychomycosis.  Briefly discussed with patient treatment options for nail fungus with oral Lamisil tablets, still considered gold standard for treatment.  The right third nail plate area and surrounding skin was cleansed with povidone iodine solution and dressed with thin layer bacitracin and a small gauze and Band-Aid.  I did instruct patient to continue with the twice daily warm water Epsom salt soaks and to use the Bactroban cream that you prescribed with a small gauze and Band-Aid dressing twice daily over the next week.  I did discuss at length that she should prevent all future disruption of the cuticles that does allow bacteria and fungus to infect underneath the proximal nail fold areas.  I have asked her to come back to see me again in 2 weeks to go over results of the PAS stain for nail fungus and to recheck the right third nail plate area.  I did discuss with patient that if the redness, swelling, and pain increases or does not get better, that possible total nail avulsion might be in order, but today there was no acute infection noted.    Thank you for allowing me to participate in the care of your patient.    Sincerely,    ___________________________  Reviewed and Electronically Signed By: Renae Fickle Soraiya Ahner DPM  Sig Date: 07/05/2012  Sig Time: 15:41:30  Dictated By: Renae Fickle Adamariz Gillott DPM  Dict Date: 07/03/2012 Dict Time: 05 01 PM    Dictation Date and Time:07/03/2012 17:01:59  Transcription Date and Time:07/03/2012 20:38:13  eScription Dictation id: [id number] Confirmation # :[id number]    DICTATED BY: Hazelgrace Bonham DPMPAUL Kataleya Zaugg DPMD:07/03/2012 17:01:59 T:07/03/2012 20:38:13 DN Job#: [id number]

## 2012-07-18 ENCOUNTER — Ambulatory Visit (HOSPITAL_BASED_OUTPATIENT_CLINIC_OR_DEPARTMENT_OTHER): Payer: No Typology Code available for payment source | Admitting: Primary Podiatric Medicine

## 2012-07-18 DIAGNOSIS — B351 Tinea unguium: Secondary | ICD-10-CM

## 2012-07-18 DIAGNOSIS — M79609 Pain in unspecified limb: Secondary | ICD-10-CM

## 2012-07-18 MED ORDER — TERBINAFINE HCL 250 MG PO TABS
250.00 mg | ORAL_TABLET | Freq: Every day | ORAL | Status: AC
Start: 2012-07-18 — End: 2012-10-16

## 2012-07-18 NOTE — Progress Notes (Signed)
Date of Service: 07/18/2012    SUBJECTIVE:  This very pleasant 50 year old female patient returns for followup evaluation of fungus-like toenail at her left great toe and to reevaluate recent infected right third toenail.  She does describe that redness and swelling has subsided quite a bit at the right third toenail area.  She has had problems with discoloration at her left great toenail and nail fragments obtained on her initial visit 2 weeks ago did reveal positive fungal organisms on PAS stain.  On questioning, she does describe taking an oral antifungal pill in [place] over a year ago and did apparently see some improvement in the nail plates at that time, but noticed return of discoloration and thickening 6-10 months later.    PHYSICAL EXAMINATION:  The patient is alert and oriented today, in no acute distress.  She does have still yellow nail plate discoloration with minimal thickening and scant distal subungual debris at the distal medial quadrant of the left great toenail consistent with onychomycosis.  No localized nail fold edema or erythema at the left great toe.  Right third toenail plate today reveals no significant proximal nail fold edema or erythema and no evidence of any subungual or periungual drainage.  There does appear to be some remaining evidence of disruption of the cuticles of a number of her nail plates, but in general no proximal nail fold abscesses at either foot.  Dorsalis pedis and posterior tibial pulses are +2/4.  Web spaces are clean, dry, and maceration free.    ASSESSMENT:  Clinical onychomycosis at both great toenail plates with minimal pain.  Resolved paronychia at the proximal nail fold area of the right third toe.    PLAN:  Today, I did discuss with patient positive fungus from nail fragments taken from the left great toe and treatment options including a 100-month course of oral Lamisil tablets versus ineffective topical remedies.  She would like to try the oral Lamisil.  I am  not sure if this is the same medication she took in [place] over a year ago.  Prescription for terbinafine hydrochloride tablets was sent to her [facility], 250 mg to be taken 1 tablet daily for 3 months.  I have asked her to come back in 6 weeks to reevaluate the nail areas and also to perform hepatic function testing.  I did warn patient on avoiding all cuticle trimming or disruption at nail salon or at home.  She was advised to discontinue the oral terbinafine hydrochloride tablets if she has any adverse side effect such as nausea, vomiting, diarrhea, stomach upset, skin rash.  I did discuss with patient not to expect any significant improvement in the nail plates for at least 6-10 months.    ___________________________  Reviewed and Electronically Signed By: Renae Fickle Jax Kentner DPM  Sig Date: 07/20/2012  Sig Time: 11:45:19  Dictated By: Renae Fickle Mildred Bollard DPM  Dict Date: 07/18/2012 Dict Time: 04 13 PM    Dictation Date and Time:07/18/2012 16:13:20  Transcription Date and Time:07/18/2012 16:27:23  eScription Dictation id: [id number] Confirmation # :[id number]      DICTATED BY: Aeriel Boulay DPMPAUL Edye Hainline DPMD:07/18/2012 16:13:20 T:07/18/2012 16:27:23 DN Job#: [id number]

## 2012-07-18 NOTE — Progress Notes (Signed)
See dictation

## 2012-07-20 ENCOUNTER — Other Ambulatory Visit (HOSPITAL_BASED_OUTPATIENT_CLINIC_OR_DEPARTMENT_OTHER): Payer: Self-pay | Admitting: Internal Medicine

## 2012-07-20 DIAGNOSIS — F329 Major depressive disorder, single episode, unspecified: Principal | ICD-10-CM

## 2012-07-20 DIAGNOSIS — F3289 Other specified depressive episodes: Secondary | ICD-10-CM

## 2012-07-20 NOTE — Progress Notes (Signed)
.  [facility] INTERNAL MED    Person calling on behalf of patient: Pharmacy    May list multiple medications in this section    Medicine Name: flouxetine        Documented patient preferred pharmacies:   [facility] - [address] - [place], [place] - [address]  Phone: [phone] Fax: [phone]

## 2012-07-20 NOTE — Progress Notes (Signed)
PER Pharmacy,     Loretta Martin, a 51 year old female has requested a refill of fluoxetine 20mg  .    Last Office Visit: 06/27/2012  Last Physical Exam: 10/30/2008    Other Med Adult:  Most Recent BP Reading(s)  06/27/12 : 102/68        Cholesterol (mg/dl)   Date  Value    1/61/0960  207*   ----------    LDL DIRECT (mg/dl)   Date  Value    4/54/0981  130*   ----------    HDL (mg/dl)   Date  Value    1/91/4782  57    ----------    TRIGLYCERIDES (mg/dl)   Date  Value    95/03/2129  79    ----------        THYROID SCREEN TSH REFLEX FT4 (uIU/mL)   Date  Value    10/31/2008  2.83    ----------        TSH (THYROID STIM HORMONE) (uIU/ml)   Date  Value    08/10/2005  3.36    ----------      HEMOGLOBIN A1C (%)   Date  Value    12/28/2010  5.5    ----------        INR (no units)   Date  Value    05/12/2009  1.0*   ----------       Documented patient preferred pharmacies:    [facility] - [address] - [place], [place] - [address]  Phone: [phone] Fax: [phone]

## 2012-07-21 MED ORDER — FLUOXETINE HCL 20 MG PO CAPS
40.0000 mg | ORAL_CAPSULE | Freq: Every day | ORAL | Status: DC
Start: 2012-07-20 — End: 2013-08-14

## 2012-08-21 ENCOUNTER — Other Ambulatory Visit: Payer: Self-pay | Admitting: Internal Medicine

## 2012-08-22 NOTE — Progress Notes (Signed)
Person calling on behalf of patient: Pharmacy    Loretta Martin is a 51 year old female       Medication(s) request:    - Gabapentin 300mg   Last office visit: 06/27/12  Last physical exam: 10/30/08      Other Med Adult:  Most Recent BP Reading(s)  06/27/12 : 102/68        Cholesterol (mg/dl)   Date  Value    1/61/0960  207*   ----------    LDL DIRECT (mg/dl)   Date  Value    4/54/0981  130*   ----------    HDL (mg/dl)   Date  Value    1/91/4782  57    ----------    TRIGLYCERIDES (mg/dl)   Date  Value    95/03/2129  79    ----------        THYROID SCREEN TSH REFLEX FT4 (uIU/mL)   Date  Value    10/31/2008  2.83    ----------        TSH (THYROID STIM HORMONE) (uIU/ml)   Date  Value    08/10/2005  3.36    ----------      HEMOGLOBIN A1C (%)   Date  Value    12/28/2010  5.5    ----------        INR (no units)   Date  Value    05/12/2009  1.0*   ----------           Documented patient preferred pharmacies:    [facility] - [address] - [place], [place] - [address]  Phone: [phone] Fax: [phone]

## 2012-08-29 ENCOUNTER — Ambulatory Visit (HOSPITAL_BASED_OUTPATIENT_CLINIC_OR_DEPARTMENT_OTHER): Payer: No Typology Code available for payment source | Admitting: Primary Podiatric Medicine

## 2012-10-18 ENCOUNTER — Ambulatory Visit (HOSPITAL_BASED_OUTPATIENT_CLINIC_OR_DEPARTMENT_OTHER): Payer: No Typology Code available for payment source | Admitting: Internal Medicine

## 2012-10-23 ENCOUNTER — Encounter (HOSPITAL_BASED_OUTPATIENT_CLINIC_OR_DEPARTMENT_OTHER): Payer: Self-pay | Admitting: Internal Medicine

## 2012-10-23 ENCOUNTER — Ambulatory Visit (HOSPITAL_BASED_OUTPATIENT_CLINIC_OR_DEPARTMENT_OTHER): Payer: No Typology Code available for payment source | Admitting: Internal Medicine

## 2012-10-23 VITALS — BP 100/60 | HR 89 | Temp 97.7°F | Wt 139.0 lb

## 2012-10-23 DIAGNOSIS — Z0000 Encounter for general adult medical examination without abnormal findings: Secondary | ICD-10-CM

## 2012-10-23 DIAGNOSIS — F3289 Other specified depressive episodes: Secondary | ICD-10-CM

## 2012-10-23 DIAGNOSIS — E559 Vitamin D deficiency, unspecified: Secondary | ICD-10-CM

## 2012-10-23 DIAGNOSIS — R21 Rash and other nonspecific skin eruption: Secondary | ICD-10-CM

## 2012-10-23 DIAGNOSIS — K589 Irritable bowel syndrome without diarrhea: Secondary | ICD-10-CM

## 2012-10-23 DIAGNOSIS — R109 Unspecified abdominal pain: Secondary | ICD-10-CM

## 2012-10-23 DIAGNOSIS — F329 Major depressive disorder, single episode, unspecified: Secondary | ICD-10-CM

## 2012-10-23 DIAGNOSIS — R3589 Other polyuria: Secondary | ICD-10-CM

## 2012-10-23 DIAGNOSIS — R358 Other polyuria: Secondary | ICD-10-CM

## 2012-10-23 LAB — URINALYSIS
BILIRUBIN, URINE: NEGATIVE
GLUCOSE, URINE: NEGATIVE MG/DL
KETONE, URINE: NEGATIVE MG/DL
LEUKOCYTE ESTERASE: NEGATIVE
NITRITE, URINE: NEGATIVE
OCCULT BLOOD, URINE: NEGATIVE
PH URINE: 6.5 (ref 5.0–8.0)
PROTEIN, URINE: NEGATIVE MG/DL
SPECIFIC GRAVITY URINE: 1.01 (ref 1.003–1.035)

## 2012-10-23 MED ORDER — VITAMIN D 400 UNITS PO TABS
400.0000 [IU] | ORAL_TABLET | Freq: Every day | ORAL | Status: DC
Start: 2012-10-23 — End: 2013-11-20

## 2012-10-23 MED ORDER — LORATADINE 10 MG PO TABS
10.0000 mg | ORAL_TABLET | Freq: Every day | ORAL | Status: AC
Start: 2012-10-23 — End: 2013-04-23

## 2012-10-23 NOTE — Progress Notes (Signed)
Loretta Martin is a 52 year old female who is ostensibly here for "PE"  But reports a large number of somatic concerns.  1) bumps all over body for more than a year.  Seems to be coming more frequently and lasting longer.  Never goes away from entire body, but can be present anywhere.  Each spot lasts weeks.  Very itchy.  Friend had the same thing and believed it was "emotional allergy."  No fevers or chills.  Not clearly worse or better with stresses.  Has children in the house without any rash.  Did not seem to be better when she was in [place].  loratidine helped in the past  2) Mind is kind of slow, so changed omega 3 fatty acids  3) in the last week has noted swelling at the belly.  Believes this is an "infection."  No pain with urination, but urinating frequently.  Normal appearing. Bladder "seems low."  No change in bowel movements.  Uses "Natural laxitives."  No pain with defication no blood.  4)  Mild Vaginal discharge.  Clear and no smell, but after a while turns yellowish. Has been present the same 1 week that belly has been distended.  Not sexually active for about 6 months.  Still married, but separated 6 months ago.  Vaginal bleeding we discussed last year has stopped, and she is not using vaginal cream.      See PHQ-9    OBJECTIVE:  BP 100/60   Pulse 89   Temp(Src) 97.7 F (36.5 C) (Oral)   Wt 139 lb (63.05 kg)   BMI 25.58 kg/m2   SpO2 97%   LMP 11/02/2009  Appears well, in no apparent distress.  Scattered isolated red papules arms nack and near ears  S1 and S2 normal, no murmurs, clicks, gallops or rubs. Regular rate and rhythm. Chest is clear; no wheezes or rales. No edema or JVD.The abdomen is soft without, guarding, mass, rebound or organomegaly.  Reports mild suprapubic tenderness.   No CVA tenderness or inguinal adenopathy noted.     (V70.0) Routine general medical examination at a health care facility  (primary encounter diagnosis)  Comment: updated R[facility]    (782.1) Rash and other  nonspecific skin eruption  Comment: appears to be bed bugs.  There are a few things that do not fit this, but rash is benign appearing.  Encouraged patient to wrap mattress and see if this impacts rash.  Will refill calritine as she requests  Plan: loratadine (CLARITIN) 10 MG tablet    (788.42) Polyuria  Comment: diabetes possible as is UA.  Will check labs as she is a complex historian  Plan: URINALYSIS, URINE CULTURE    (789.09) Abdominal  pain, other specified site  Comment: large differential.  Previously diagnosed with IBS.  Diabetes also in differential.  Low liklihood for STD, but will have her see female provider for pelvic at her request.  Since checking urine will get GC chlamydia  Plan: COLLECTION VENOUS BLOOD VENIPUNCTURE,         HEMOGLOBIN A1C, AMPLIFIED GENPROBE CHLAM/GC    (564.1) IBS (irritable bowel syndrome)  Comment: as above.  May be stable    (311) Depressive disorder, not elsewhere classified  Comment: PHQ 9 reasonably low    (268.9) Vitamin d deficiency  Plan: cholecalciferol (VITAMIN D3) 400 UNIT tablet

## 2012-10-24 LAB — HEMOGLOBIN A1C
ESTIMATED AVERAGE GLUCOSE: 91 (ref 74–160)
HEMOGLOBIN A1C: 4.8 % (ref 4.0–5.6)

## 2012-10-24 LAB — CHLAMYDIA GC NAAT
GENPROBE CHLAMYDIA: NEGATIVE
GENPROBE GC: NEGATIVE

## 2012-10-24 LAB — URINE CULTURE

## 2012-10-25 ENCOUNTER — Ambulatory Visit (HOSPITAL_BASED_OUTPATIENT_CLINIC_OR_DEPARTMENT_OTHER): Payer: No Typology Code available for payment source | Admitting: Internal Medicine

## 2012-10-25 ENCOUNTER — Encounter (HOSPITAL_BASED_OUTPATIENT_CLINIC_OR_DEPARTMENT_OTHER): Payer: Self-pay | Admitting: Internal Medicine

## 2012-10-25 VITALS — BP 104/64 | HR 94 | Temp 98.6°F | Wt 138.0 lb

## 2012-10-25 DIAGNOSIS — R102 Pelvic and perineal pain unspecified side: Secondary | ICD-10-CM

## 2012-10-25 DIAGNOSIS — B3731 Acute candidiasis of vulva and vagina: Secondary | ICD-10-CM

## 2012-10-25 DIAGNOSIS — N393 Stress incontinence (female) (male): Secondary | ICD-10-CM

## 2012-10-25 DIAGNOSIS — B373 Candidiasis of vulva and vagina: Secondary | ICD-10-CM

## 2012-10-25 LAB — VAGINAL PH (POINT OF CARE): PH BODY FLUID: 5 (ref 4.5–7.5)

## 2012-10-25 LAB — VAGINAL KOH (POINT OF CARE)

## 2012-10-25 NOTE — Progress Notes (Signed)
SUBJECTIVE:    Loretta Martin is a 52 y.o who presents with c/c of pelvic pain, vaginal discharge x 1 week    1. Reports having pelvic pain and abdominal bloating. Abdominal bloating is not new, hx of IBS  Denies increase in constipation.  + pelvic pain, described the pain as a pressure sensation.   Hx of urinary incontinence has see urology in the past, was recommended by urology to have surgical sling procedure in 2011, however pt did not follow up with urology to have this procedure done.   Would like to be referred back to Urology for consultation to see if she can proceed with the surgery     White discharge x 2 weeks, denies vulvovaginal itching, irritation, dysuria, abnormal vaginal bleeding.   Is not sexually active     OBJECTIVE:  General: A+0x3,NAD  BP 104/64   Pulse 94   Temp(Src) 98.6 F (37 C) (Oral)   Wt 138 lb (62.596 kg)   BMI 25.39 kg/m2   SpO2 97%   LMP 11/02/2009  The abdomen is soft without tenderness, guarding, mass, rebound or organomegaly. Bowel sounds are normal. No CVA tenderness or inguinal adenopathy noted.  Pelvic exam: normal vagina and vulva, vaginal discharge described as white, normal cervix without lesions, polyps or tenderness, uterus normal size, shape, consistency, no mass or tenderness, adnexa normal in size without mass or tenderness.      ASSESSMENT/PLAN  (625.9) Pelvic pain  (primary encounter diagnosis)  Comment:   Normal finding on exam,   Due to chronic abdominal bloating, I will still send pt for pelvic u/s to r/o ovarian pathology   Plan: ORDER FOR ULTRASOUND            (625.6) Female stress incontinence  Comment:   Plan: REFERRAL TO UROLOGY ( INT)            (112.1) Candida vaginitis  Comment:    + hyphae  Ph 5.0  Negative clue cells, trich, wbcs, whiff  Pt reports only having vaginal discharge, no other symptoms, no hx of DM, therefore treatment is not necessary at this time, if pt does become symptomatic of vaginitis, can use Monistat OTC  Plan: VAGINAL  KOH (POINT OF CARE), VAGINAL PH (POINT         OF CARE)         I have spent 25 minutes in face to face time with this patient/patient proxy of which > 50% was in counseling or coordination of care regarding above issues/Dx.

## 2012-10-26 ENCOUNTER — Telehealth (HOSPITAL_BASED_OUTPATIENT_CLINIC_OR_DEPARTMENT_OTHER): Payer: Self-pay

## 2012-10-26 NOTE — Telephone Encounter (Signed)
Message copied by Sharlyne Pacas on Thu Oct 26, 2012  3:53 PM  ------       Message from: Joneen Roach       Created: Thu Oct 26, 2012  3:16 PM       Regarding: rash body       Contact: [phone]         Loretta Martin is a 52 year old old female.              Patient's PCP: Arlice Colt, MD              In case we get disconnected what is the best number to reach you at today:       Mobile Phone Telephone Information:       Mobile          [phone]                       Person calling:       Patient (self)              How can I help you today:        Sick Call:         What is your main complaint today?           Have body rash,want to get an Allergy test. Dr. Chilton Si told her it may be bed bug. She said she is the only one that has it. Her two kids that she lives with doesn't have it.              The nurse will call you back within one hour.  She may call you at anytime. Can you remain at your telephone for the next 60 minutes?       Yes                       Patient's language of care: [place]              Would you like an interpreter when the nurse calls you back?       YES  [place]         ------

## 2012-10-26 NOTE — Progress Notes (Addendum)
Call to patient, with portuguese interpreter Christina-reports rash is unchanged  No one else in the house has this  States exterminated  Tried claritan for over a week without change  States rash is the same-has a bunch of new bites on her neck  States that she did wrap bed as well same day she was in for last visit  Wants to know what she should do-very itchy  Will send to dr green for input and get back to patient

## 2012-10-26 NOTE — Progress Notes (Addendum)
I think it is a little soon to determine if this is a failed treatment since the spots come out later, but the fastest way to get her furhter testing is to have come in just so I can photograph her skin and send a picture to the dermatologist.  If needed he will then arrange skin testing.  If she can come in Monday, (I can add her in in the late morning or afternoon for a quick visit) or I will help a cross covering physician use the camera to make the consultation.  (as an aside, Selena Batten has the camera, so if this is booked with a colleague they will need to know how to find the camera.)   Please also let her no that lab results were normal.

## 2012-10-27 NOTE — Progress Notes (Addendum)
Call back to patient, lm to call clinic and speak with front desk or RN for appt on Monday as outlined as below per dr green. Please schedule as below if patient returns call

## 2012-11-08 ENCOUNTER — Ambulatory Visit: Payer: Self-pay | Admitting: Internal Medicine

## 2012-11-13 ENCOUNTER — Ambulatory Visit: Payer: Self-pay | Admitting: Internal Medicine

## 2012-11-28 ENCOUNTER — Ambulatory Visit (HOSPITAL_BASED_OUTPATIENT_CLINIC_OR_DEPARTMENT_OTHER): Payer: No Typology Code available for payment source | Admitting: Urology

## 2012-11-28 DIAGNOSIS — N393 Stress incontinence (female) (male): Secondary | ICD-10-CM

## 2012-11-28 DIAGNOSIS — N952 Postmenopausal atrophic vaginitis: Secondary | ICD-10-CM

## 2012-11-28 LAB — URINE DIP (POINT OF CARE)
BILIRUBIN, URINE: NEGATIVE
GLUCOSE, URINE: NEGATIVE mg/dl
KETONE, URINE: NEGATIVE mg/dl
LEUKOCYTE ESTERASE: NEGATIVE
NITRITE, URINE: NEGATIVE
OCCULT BLOOD, URINE: NEGATIVE
PH URINE: 5.5 (ref 5.0–8.0)
PROTEIN, URINE: NEGATIVE mg/dl (ref 0–15)
SPECIFIC GRAVITY URINE: 1.015 (ref 1.003–1.030)
UROBILINOGEN URINE: 0.2 mg/dl (ref 0.2–1.0)

## 2012-11-28 LAB — MEAS POST-VOIDING RESIDUAL URINE&/BLADDER CAP: Post Void Residual: 18 (ref 0–0)

## 2012-11-28 MED ORDER — ESTRADIOL 0.1 MG/GM VA CREA
2.0000 g | TOPICAL_CREAM | Freq: Every day | VAGINAL | Status: AC
Start: 2012-11-28 — End: 2013-02-25

## 2012-11-28 NOTE — Progress Notes (Signed)
Patient presents with:    Incontinence - Female stress incontinence    Last seen in 2011- agreed to sling but did not show up (she now states that she could not take too much time off from work)    Please see UDS questionnaire for full HPI    On estrace vaginal cream (stopped 2 months ago)      AP  Repeat UDS since it has been about 3 years  Recommend UDS to further investigate this patient's voiding dynamics.  I have explained the entire procedure to the patient and why it is important to establish a diagnosis via urodynamics first before formulating a treatment plan which may consist of behavioral modification, surgery, medication, biofeedback, or a combination of the above.    Resume estrace vaginal cream  FU after UDS

## 2012-11-29 LAB — URINE CULTURE: URINE CULTURE/COLONY COUNT: NO GROWTH

## 2012-12-01 ENCOUNTER — Other Ambulatory Visit (HOSPITAL_BASED_OUTPATIENT_CLINIC_OR_DEPARTMENT_OTHER): Payer: Self-pay | Admitting: Internal Medicine

## 2012-12-01 NOTE — Progress Notes (Signed)
Person calling on behalf of patient: Pharmacy    Loretta Martin is a 52 year old female       Medication(s) request:    - Proair HFA 108 (90 base) mcg/act  Last office visit: 10/25/12  Last physical exam: 10/23/12      Other Med Adult:  Most Recent BP Reading(s)  10/25/12 : 104/64        Cholesterol (mg/dl)   Date  Value    03/07/5408  207*   ----------    LDL DIRECT (mg/dl)   Date  Value    05/14/9146  130*   ----------    HDL (mg/dl)   Date  Value    06/01/5620  57    ----------    TRIGLYCERIDES (mg/dl)   Date  Value    30/05/6577  79    ----------        THYROID SCREEN TSH REFLEX FT4 (uIU/mL)   Date  Value    10/31/2008  2.83    ----------        TSH (THYROID STIM HORMONE) (uIU/ml)   Date  Value    08/10/2005  3.36    ----------      HEMOGLOBIN A1C (%)   Date  Value    10/23/2012  4.8    ----------        INR (no units)   Date  Value    05/12/2009  1.0*   ----------           Documented patient preferred pharmacies:    [facility] [address] - [place], [place] [address]  Phone: [phone] Fax: [phone]

## 2012-12-18 ENCOUNTER — Ambulatory Visit (HOSPITAL_BASED_OUTPATIENT_CLINIC_OR_DEPARTMENT_OTHER): Payer: No Typology Code available for payment source

## 2012-12-18 VITALS — BP 124/80 | HR 78 | Temp 99.0°F | Wt 138.0 lb

## 2012-12-18 DIAGNOSIS — R51 Headache: Principal | ICD-10-CM

## 2012-12-18 DIAGNOSIS — M545 Low back pain, unspecified: Secondary | ICD-10-CM

## 2012-12-18 DIAGNOSIS — R519 Headache, unspecified: Secondary | ICD-10-CM

## 2012-12-18 MED ORDER — IBUPROFEN 600 MG PO TABS
600.00 mg | ORAL_TABLET | Freq: Four times a day (QID) | ORAL | Status: AC | PRN
Start: 2012-12-18 — End: 2013-01-17

## 2012-12-18 MED ORDER — IBUPROFEN 400 MG PO TABS
400.0000 mg | ORAL_TABLET | Freq: Four times a day (QID) | ORAL | Status: DC | PRN
Start: 2012-12-18 — End: 2012-12-18

## 2012-12-18 NOTE — Progress Notes (Signed)
CC: frontal headache and low back pain    HPI: 52yo woman coming in for acute visit re: headache and low back pain as follows:    Headache: frontal, dull in character, non-radiating, 7-8/10, present for two days intermittently.  Has not tried any OTC meds.  Does not usually have headaches, only with allergies which she doesn't think she currently has.  Less PO intake during past couple of days, feeling very stressed with work.  Denies any vision changes, tinnitus, sinus problems, rhinorrhea, sore throat, cough, fevers, muscle aches, n/v, dizziness.     Low back pain: tells me it's chronic; worse this morning for unknown reason.  Denies any trauma to the area, incontinence, numbness/tingling down legs.  Has tender muscles.  May be from sleeping in wrong position.  Also complains of stiffness when she wakes up that gets better as day unfolds.  Pain not limiting factor in performance of daily activities.  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  Life stresses may be adding to "body tension".    ROS: in addition to HPI denies abdominal pain, weight gain/loss, rashes, joint swelling.    Allergies:  Review of Patient's Allergies indicates:   Hydrocodone-acetami*    Nausea and Vomiting   Pcn (penicillins)           Comment:Got a urine infection after using    Current Medications:    Current Outpatient Prescriptions on File Prior to Visit:  PROAIR HFA 108 (90 BASE) MCG/ACT inhaler Inhale 2 puffs into the lungs every 4 (four) hours as needed for Wheezing. Disp: 1 Inhaler Rfl: 11   estradiol (ESTRACE VAGINAL) 0.1 MG/GM vaginal cream Place 2 g vaginally daily. Disp: 42.5 g Rfl: 0   cholecalciferol (VITAMIN D3) 400 UNIT tablet Take 1 tablet by mouth daily. Disp: 30 tablet Rfl: 12   loratadine (CLARITIN) 10 MG tablet Take 1 tablet by mouth daily. Disp: 30 tablet Rfl: 6   gabapentin (NEURONTIN) 300 MG capsule Take 1 capsule by mouth 2 (two) times daily. Disp: 180 capsule Rfl: 3   FLUoxetine  (PROZAC) 20 MG capsule Take 2 capsules by mouth daily. Disp: 60 capsule Rfl: 11   fluticasone (FLONASE) 50 MCG/ACT nasal spray 2 sprays by Each Nostril route daily. Disp: 48 g Rfl: 3   fish oil-omega-3 fatty acids 1000 MG capsule Take 2 capsules by mouth daily. Disp: 30 capsule Rfl:      No current facility-administered medications on file prior to visit.    Physical Exam:  BP 124/80   Pulse 78   Temp(Src) 99 F (37.2 C) (Oral)   Wt 138 lb (62.596 kg)   BMI 25.39 kg/m2   LMP 11/02/2009  General: pleasant woman who looks anxious but in no acute distress  HEENT: +forehead tenderness on palpation, no scalp tenderness.  PERRL, EOMi, conjunctiva non-injected. Oral mucosa semi-dry and without lesions.  No cervical lymphadenopathy.  MSK: point tenderness at base of occipetal scalp, bilateral neck/trapezius to mid back.  Also has paraspinal tenderness along sacrum, no bruising or edema.  Preserved ROM of hips, legs, arms.  No point tenderness beyond mid back.    Cardio: RRR, nl S1 and S2. No murmurs appreciated  Lungs: CTAB  Abdomen: soft, nt, nd. Bowel sounds present  Extremities: warm, no LE edema.       A/P    (784.0) Frontal headache  (primary encounter diagnosis)  Comment: Likely from dehydration.  Encouraged hydration, scheduled NSAIDs (600mg  TID X5days) to  be taken with food.  She will call back the office if HA does not improve.  Plan: ibuprofen (ADVIL,MOTRIN) 600 MG tablet            (724.2) Low back pain  Comment: unclear trigger for worsening today, presentation consistent with musculoskeletal cause either positional (overnight while asleep) vs overuse at work.  May have component of OA contributing to pain.  Encouraged exercise including morning and nightly stretches which I taught her as well as relaxation techniques (for her stresses), and application of warm compress to the area.  Will do scheduled NSAIDs as above to address inflammation and pain control.  Offered PT, but patient declines.    Plan: ibuprofen  (ADVIL,MOTRIN) 600 MG tablet    Follow Up: with PCP as needed    Irven Shelling, MD

## 2012-12-20 NOTE — Progress Notes (Signed)
PRECEPTOR NOTE  On the day of the patient's visit, I personally saw and evaluated the patient. In addition, I reviewed findings with the resident.  I confirm the key elements of history and physical exam as described in resident's note.  I agree with the assessment and plan as described above.  Please see resident's note for further details.

## 2013-01-12 ENCOUNTER — Other Ambulatory Visit (HOSPITAL_BASED_OUTPATIENT_CLINIC_OR_DEPARTMENT_OTHER): Payer: Self-pay | Admitting: Internal Medicine

## 2013-01-12 NOTE — Progress Notes (Signed)
PER Pharmacy, Loretta Martin is a 52 year old female has requested a refill of Fluticasone Nasal Spray.      Last Office Visit: 12/18/12  Last Physical Exam: 10/23/12      Other Med Adult:  Most Recent BP Reading(s)  12/18/12 : 124/80        Cholesterol (mg/dl)   Date  Value    1/61/0960  207*   ----------    LDL DIRECT (mg/dl)   Date  Value    4/54/0981  130*   ----------    HDL (mg/dl)   Date  Value    1/91/4782  57    ----------    TRIGLYCERIDES (mg/dl)   Date  Value    95/03/2129  79    ----------        THYROID SCREEN TSH REFLEX FT4 (uIU/mL)   Date  Value    10/31/2008  2.83    ----------        TSH (THYROID STIM HORMONE) (uIU/ml)   Date  Value    08/10/2005  3.36    ----------      HEMOGLOBIN A1C (%)   Date  Value    10/23/2012  4.8    ----------        INR (no units)   Date  Value    05/12/2009  1.0*   ----------       Documented patient preferred pharmacies:    [facility] [address] - [place], [place] - [address]  Phone: [phone] Fax: [phone]

## 2013-01-16 ENCOUNTER — Encounter (HOSPITAL_BASED_OUTPATIENT_CLINIC_OR_DEPARTMENT_OTHER): Payer: Self-pay | Admitting: Urology

## 2013-01-16 ENCOUNTER — Ambulatory Visit (HOSPITAL_BASED_OUTPATIENT_CLINIC_OR_DEPARTMENT_OTHER): Payer: No Typology Code available for payment source | Admitting: Urology

## 2013-01-16 VITALS — BP 128/70 | HR 80 | Temp 97.9°F

## 2013-01-16 DIAGNOSIS — N393 Stress incontinence (female) (male): Secondary | ICD-10-CM

## 2013-01-16 LAB — URINE DIP (POINT OF CARE)
BILIRUBIN, URINE: NEGATIVE
GLUCOSE, URINE: NEGATIVE mg/dl
KETONE, URINE: NEGATIVE mg/dl
LEUKOCYTE ESTERASE: NEGATIVE
NITRITE, URINE: NEGATIVE
OCCULT BLOOD, URINE: NEGATIVE
PH URINE: 5.5 (ref 5.0–8.0)
PROTEIN, URINE: NEGATIVE mg/dl (ref 0–15)
SPECIFIC GRAVITY URINE: 1.005 (ref 1.003–1.030)
UROBILINOGEN URINE: 0.2 mg/dl (ref 0.2–1.0)

## 2013-01-16 LAB — MEAS POST-VOIDING RESIDUAL URINE&/BLADDER CAP: Post Void Residual: 0 (ref 0–0)

## 2013-01-16 NOTE — Progress Notes (Signed)
Patient tolerated urodynamics procedure well. [id number]. No c/o pain, no bleeding. Will follow-up in two to three weeks to discuss results.

## 2013-01-31 NOTE — Progress Notes (Signed)
Today, she comes back and she has stated that things have not improved and on further discussion with her, she has decided she will undergo urodynamics studies.  At this time, she was properly consented and subsequently was brought to the urodynamics suite.      She was asked to void spontaneously to record an initial uroflow and post void residual from a straight catheterization of her bladder.    The vesical pressure catheter was placed per urethra into the bladder, and the abdominal pressure sensor was placed in the vagina.  Both were taped in place.  Subsequently, the EMG pads were also placed and at this time, the procedure commenced.  Her bladder was filled with water and she stated when she had a first desire to urinate, when she had a normal desire, and then when she had maximal capacity.  Subsequently then, the bulbocavernosus reflex was performed.  Then she was given the command to Valsalva and cough both in the supine position, also in the standing position.  Finally, she was given the micturition command to urinate when she moved over to the commode.  This was then also documented.  She then returned to the table and a urethral pressure profile was performed.  All the catheters and the pads were then removed.    She was dressed and she was given a follow-up appointment to return in 1-3 weeks to go over those results.    Full report can be found in the electroic chart however, briefly  First sensation-9  First desire-118  Full capacity-611 (voided)    Patient did leak with valsalva or coughing with a full bladder  No detrusor overactivity on filling  Detrusor Acitivity with micturation  Positive sphincteric dyssyergia  Normal bulbocavernosal reflex and UPP  Unobstructed uroflow parameters    AP  Will discuss with patient at next visit.

## 2013-02-06 ENCOUNTER — Ambulatory Visit (HOSPITAL_BASED_OUTPATIENT_CLINIC_OR_DEPARTMENT_OTHER): Payer: No Typology Code available for payment source | Admitting: Urology

## 2013-02-06 DIAGNOSIS — N393 Stress incontinence (female) (male): Secondary | ICD-10-CM

## 2013-02-06 NOTE — Progress Notes (Signed)
Patient presents with:    Other - UDS    Results    SUI    Bladder capacity of over 600    On estrace vaginal cream for a month (did not find that it helped)    Female Pelvic exam-  Female chaperone was present in the room  Urethral meatus was prepped with antiseptic and a lubricated Q-tip was placed within the urethra.    The patient was asked to valsalva and cough.  Then a half speculum was placed in the vagina-  First to press down on the rectum and then to compress the bladder.  This is to inspect for a cystocele, enterocele, or rectocele.  Bimanual exam performed  Finally, a digital rectal exam was performed.    Q-tip  45 degrees  No cystocele, enterocele, rectocele  Good anal tone   No rectal masses    AP  The patient does show leakage upon valsalva with a full bladder.  This may be the result of a hypermobile urethra and may be addressed with surgical procedures such as a sling or injection therapy.  There does not appear to be bladder overactivity and the capacity appears adequate as well.  Patient has elected injection therapy. Patient understand the risks and benefits of the surgery.  Patient has signed the informed consent and will be scheduled for medical clearance, preop testing, and the surgery.  I have spent more than 15/25 minutes in face to face counseling time with this patient and have answered all questions.

## 2013-03-05 ENCOUNTER — Ambulatory Visit (HOSPITAL_BASED_OUTPATIENT_CLINIC_OR_DEPARTMENT_OTHER): Payer: No Typology Code available for payment source | Admitting: Internal Medicine

## 2013-03-05 ENCOUNTER — Encounter (HOSPITAL_BASED_OUTPATIENT_CLINIC_OR_DEPARTMENT_OTHER): Payer: Self-pay | Admitting: Internal Medicine

## 2013-03-05 VITALS — BP 108/64 | HR 83 | Temp 98.2°F | Wt 138.0 lb

## 2013-03-05 DIAGNOSIS — K589 Irritable bowel syndrome without diarrhea: Secondary | ICD-10-CM

## 2013-03-05 DIAGNOSIS — G5600 Carpal tunnel syndrome, unspecified upper limb: Secondary | ICD-10-CM

## 2013-03-05 DIAGNOSIS — R197 Diarrhea, unspecified: Secondary | ICD-10-CM

## 2013-03-05 DIAGNOSIS — F329 Major depressive disorder, single episode, unspecified: Secondary | ICD-10-CM

## 2013-03-05 DIAGNOSIS — F3289 Other specified depressive episodes: Secondary | ICD-10-CM

## 2013-03-05 MED ORDER — LOPERAMIDE HCL 2 MG PO CAPS
2.00 mg | ORAL_CAPSULE | Freq: Four times a day (QID) | ORAL | Status: AC | PRN
Start: 2013-03-05 — End: 2013-04-02

## 2013-03-05 NOTE — Progress Notes (Signed)
Loretta Martin is a 52 year old female here for evaluation of hand symptoms, diarrhea and depressive symptoms.     Reports right hand pain.  Started about a month ago.  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    Pain on right starts on ventral wrist.  It hurts if she touches/compresses right forearm/arm.  Also pain over her right hand.   Fingers sometimes get numb.  She has had this for a while she says.   She has been evaluated for this and recommended to have PT.  She does not find the pain is associated with episodes of finger tip numbness. The numbness comes on bilaterally at random times.  Not associated with activity or with the pain.  Cannot recall last episode of numbness.   Worse pain at nights.   Uses topical medicine from [place]- [place]. It helps a bit.     Also started with diarrhea on Saturday.  She got really nervous after discussing with her ex-husband and felt abdomen queezy and started with diarrhea then.  Initially watery stools.  About 5 times per day. More formed stools lately, soft BMs today.  Some abdominal bloating. Hears lots of noises from stomach. There is some abdominal pain, feels more like cramps and lots of movement inside. No blood in stools. Tolerating all her routine activities without limitations.  She has been tolerating POs well.  No nausea or vomit. No fevers or chills. No one with similar symptoms at home.      She says that is going through divorce process for the second time with same husband.  She is anxious about this.  She is sad. She says this has been ongoing for few years.  Denies any SI or HI.  She will like to get connected with therapist to talk.      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  Constitutional: She is oriented to person, place, and time. She appears well-developed. No distress.   HENT:   Mouth/Throat: Oropharynx is clear and moist.   Abdominal: Soft. Normal appearance. Bowel sounds are increased. There is no tenderness. There is no rigidity, no rebound and no guarding.   Musculoskeletal:        Right shoulder: Normal.        Right elbow: Normal.       Right wrist: She exhibits tenderness. She exhibits normal range of motion, no swelling and no deformity.        Right upper arm: Normal.        Right forearm: She exhibits tenderness. She exhibits no bony tenderness, no swelling, no edema and no deformity.        Right hand: Normal. Normal sensation noted. Normal strength noted.   + Tinel's right hand.  Neurological: She is alert and oriented to person, place, and time.         ASSESSMENT/PLAN:  (354.0) CTS (carpal tunnel syndrome)  (primary encounter diagnosis)  Comment: likely giving her right hand symptoms. Reviewed etiology, normal course, management and prognosis of this. Trial of wrist splints qhs.  Return instructions provided. In particular she knows will need further investigation if no resolution of symptoms or at any point if worsening.  She understood and agreed with this plan.    Plan: WRIST SPLINT    (787.91) Diarrhea  Comment: no red flags.  Likely related to her underlying IBS but no recent episodes of diarrhea.  Advised to use loperamide prn for the next couple of days.  Return instructions provided.   Plan: loperamide (IMODIUM) 2 MG capsule    (564.1) IBS (irritable bowel syndrome)  Comment: as above.   Plan: loperamide (IMODIUM) 2 MG capsule    (311) Depressive disorder, not elsewhere classified  Comment: ongoing depressive symptoms  now with worsening life stressors.  Not at high or intermediate risk.  Referral for psychotherapy generated.  She agrees to return if symptoms worsening or if no improvement noted.    Plan: REFERRAL TO ADULT PSYCHIATRY ( INT)

## 2013-03-06 ENCOUNTER — Other Ambulatory Visit (HOSPITAL_BASED_OUTPATIENT_CLINIC_OR_DEPARTMENT_OTHER): Payer: Self-pay | Admitting: Social Worker

## 2013-03-06 NOTE — Telephone Encounter (Signed)
Thank you for your referral to Adult Outpatient Psychiatry (OP[facility]).  We will be contacting your patient within the next 24 hours. Due to limited capacity in the adult OPD, we cannot guarantee an available clinician at this time.  We will discuss with your patient their symptoms and needs and provide the patient with an appointment in our clinic if available or with resources for services elsewhere in their community.

## 2013-03-06 NOTE — Telephone Encounter (Signed)
Thank you for your referral. We have tried to contact your patient on 03/06/2013. Her telephone is not a working number and unable to leave message. We will try again within five days.

## 2013-03-12 ENCOUNTER — Ambulatory Visit (HOSPITAL_BASED_OUTPATIENT_CLINIC_OR_DEPARTMENT_OTHER): Payer: No Typology Code available for payment source | Admitting: Primary Care

## 2013-03-12 ENCOUNTER — Encounter (HOSPITAL_BASED_OUTPATIENT_CLINIC_OR_DEPARTMENT_OTHER): Payer: Self-pay | Admitting: Primary Care

## 2013-03-12 VITALS — BP 100/60 | HR 90 | Temp 97.6°F | Wt 141.0 lb

## 2013-03-12 DIAGNOSIS — R42 Dizziness and giddiness: Secondary | ICD-10-CM

## 2013-03-12 NOTE — Progress Notes (Signed)
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 She immediatly felt dizzy and nauseous. Dizziness was more like lightheadedness, felt a "whoosh" feeling. Room did not spin, pt did not see spots. Pt did not fall or lose consciousness. Sat down for a minute and felt well enough to drive home. Once home she rested for a few hours before making the appt to be seen.     No vomiting, diarrhea, ST, cough, HA, LOC. No vision changes, blurred vision, seeing spots.  No photo/phonophobia. No new meds or supplements. No new diets or exercising. Pt ate breakfast and lunch today and has been drinking plenty of water.     Had vertigo like episode 10/2011 and was concerned that this episode was going to be the same.     Review of Systems   Constitutional: Negative.    HENT: Negative.    Eyes: Negative.    Respiratory: Negative.    Cardiovascular: Negative.    Gastrointestinal: Positive for nausea.   Genitourinary: Negative.    Musculoskeletal: Negative.    Skin: Negative.    Neurological: Positive for dizziness.   Endo/Heme/Allergies: Negative.      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    Comment: treated [facility] 11/97 CSF VDRL neg 9/00 RPR 9/00 1:2 repeat RPR yearly      Trichomoniasis, unspecified 08/31/2004    Comment: 10/97    Nonspecific reaction to tuberculin skin test without active tuberculosis 08/31/2004    Comment: +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 - had negative CXR pp at [facility] referred to TB clinic    Nonspecific abnormal results of thyroid function study 08/31/2004    Comment: increased TSH 4.19 6/00; TSH nl 9/00; 05/04/99 - wnl    Urinary tract infection, site not specified 08/31/2004    Comment: 4/02, 6/02    Depressive disorder, not elsewhere classified 08/31/2004    Comment: 10/00 to Psych on Paxil, self d/c'd; 9/00 head CT: scattered punctate calcifications    Asthma     Presbyopia 04/23/2009    PVD (posterior vitreous detachment) 04/23/2009       Current Outpatient Prescriptions on File Prior to Visit:  fluticasone (FLONASE) 50 MCG/ACT nasal spray instill 2 sprays into each nostril once daily Disp: 48 g Rfl: 3   PROAIR HFA 108 (90 BASE) MCG/ACT inhaler Inhale 2 puffs into the lungs every 4 (four) hours as needed for Wheezing. Disp: 1 Inhaler Rfl: 11   cholecalciferol (VITAMIN D3) 400 UNIT tablet Take 1 tablet by mouth daily.  Disp: 30 tablet Rfl: 12   loratadine (CLARITIN) 10 MG tablet Take 1 tablet by mouth daily. Disp: 30 tablet Rfl: 6   gabapentin (NEURONTIN) 300 MG capsule Take 1 capsule by mouth 2 (two) times daily. Disp: 180 capsule Rfl: 3   FLUoxetine (PROZAC) 20 MG capsule Take 2 capsules by mouth daily. Disp: 60 capsule Rfl: 11   loperamide (IMODIUM) 2 MG capsule Take 1 capsule by mouth 4 (four) times daily as needed for Diarrhea. Disp: 30 capsule Rfl: 0     No current facility-administered medications on file prior to visit.    Orthostatics:  106/72 P80  100/70 P76  100/68 P76    Negative Dix Hallpike    OBJECTIVE:  BP 100/60   Pulse 90   Temp(Src) 97.6 F (36.4 C) (Oral)   Wt 141 lb (63.957 kg)   BMI 25.95 kg/m2   SpO2 99%   LMP 11/02/2009  Physical Exam   Constitutional: She is oriented to person, place, and time. She appears well-developed and well-nourished.   HENT:    Head: Normocephalic and atraumatic.   Eyes: Conjunctivae and EOM are normal. Pupils are equal, round, and reactive to light.   Neck: Normal range of motion. Neck supple.   Cardiovascular: Normal rate, regular rhythm, normal heart sounds and intact distal pulses.    Pulmonary/Chest: Effort normal and breath sounds normal.   Neurological: She is alert and oriented to person, place, and time. She has normal reflexes.   Psychiatric: She has a normal mood and affect. Her behavior is normal.       A/P:    (780.4) Dizziness - light-headed  (primary encounter diagnosis)  Likely related to changing positions too quickly, less likely vertigo or other etiology  Rest today, drink plenty of fluids  Change positions slowly  Monitor sx's today and tomorrow, if not feeling better consider having help working tomorrow or rescheduling clients.  Call clinic if not feeling better tomorrow  F/u PRN      HM-UTD  RTC PRN  All of the pt's questions were answered today. Pt in agreement with the above plan and agrees to call us with any further questions, concerns, worstening of sx or new sx of concern.     30 minute visit with greater than 50% of time spent in direct face to face discussion of above outlined issues.

## 2013-03-13 NOTE — Telephone Encounter (Signed)
Patient contacted on 03/13/2013. Initial message left to call Central Intake at [phone].

## 2013-03-16 NOTE — Telephone Encounter (Signed)
Thank you for your referral. We have tried to contact your patient over the past week and have been unable to reach she. Please feel free to re-refer at a later date.

## 2013-04-05 ENCOUNTER — Ambulatory Visit (HOSPITAL_BASED_OUTPATIENT_CLINIC_OR_DEPARTMENT_OTHER): Payer: Self-pay | Admitting: Internal Medicine

## 2013-04-05 LAB — MA SCREENING MAMMO BILATERAL WITH CAD

## 2013-06-12 ENCOUNTER — Ambulatory Visit (HOSPITAL_BASED_OUTPATIENT_CLINIC_OR_DEPARTMENT_OTHER)
Admit: 2013-06-12 | Discharge: 2013-06-12 | Disposition: A | Discharge status: Home / Self Care | Payer: Self-pay | Source: Ambulatory Visit | Attending: Urology | Admitting: Urology

## 2013-06-12 LAB — URINALYSIS
BILIRUBIN, URINE: NEGATIVE
GLUCOSE, URINE: NEGATIVE MG/DL
KETONE, URINE: NEGATIVE MG/DL
LEUKOCYTE ESTERASE: NEGATIVE
NITRITE, URINE: NEGATIVE
OCCULT BLOOD, URINE: NEGATIVE
PH URINE: 6 (ref 5.0–8.0)
PROTEIN, URINE: NEGATIVE MG/DL
SPECIFIC GRAVITY URINE: 1.012 (ref 1.003–1.035)

## 2013-06-12 NOTE — H&P (Signed)
Pre-Anesthetic Note    Pre op Diagnosis: Urinary incontinence    Planned Procedure: Sling    Patient ID:  Loretta Martin is a 52 year old female  Height: 5\' 4"    Weight: 149 lb    Previous Anesthetic History:    TL    Reduction mammoplasty    C/S x2    Bilateral blepharoplasty    Abdominoplasty    Left breast lumpectomy     Patient denied complications of anesthesia.  Patient denied family complications of anesthesia.    PMHx:   Syphilis. Treated in 1997.    Trichomoniasis    +ve PPD. -ve CXR.    Chronic generalized pain and LBP    Depression    Asthma. Mild. Only uses inhaler when she gets an URTI   Very active. Works as Financial trader.    Current Medications:    fluticasone (FLONASE) 50 MCG/ACT nasal spray   PROAIR HFA 108 (90 BASE) MCG/ACT inhaler   gabapentin (NEURONTIN) 300 MG capsule   FLUoxetine (PROZAC) 20 MG capsule     Allergies:    Vicodin - N&V   PCN - UTI    Smoking, Alcohol, Drugs:  Smoking - None  Alcohol - None  Drugs - None    PHYSICAL EXAMINATION:  BP 106/81   Pulse 72   Temp(Src) 98 F (36.7 C) (Temporal)   Resp 18   Ht 5\' 4"  (1.626 m)   Wt 149 lb (67.586 kg)   BMI 25.56 kg/m2   SpO2 98%   LMP 11/02/2009    General: NAD    Airway Classification:  Mallampati: Class I    Oral Opening:  3cm  Full range of Neck Motion:  Yes  Teeth: Molar crowns    Lungs: Clear    Heart: No M    Pertinent Labs:   Component Value Date/Time   NA 138 05/12/2009  2:30 PM   K 4.8 05/12/2009  2:30 PM   WBC 5.6 05/13/2009  7:30 AM   HCT 37.8 05/13/2009  7:30 AM   CREAT 0.5 05/12/2009  2:30 PM   PLTA 193 05/13/2009  7:30 AM   PT 10.0 05/12/2009  2:30 PM   APTT 24.5 05/12/2009  2:30 PM   INR 1.0* 05/12/2009  2:30 PM     ASA Assessment: II     Potential Anesthesia Problems:  No    Plan:  Tennis Ship, 06/12/2013, 2:53 PM    Pager: [phone]        I have informed the patient of the nature and purpose of the type of anesthesia, the reasonable alternative anesthesia methods, pertinent foreseeable risks involved, and the possibility of  complications. I have explained that an alternative form of anesthesia may be required by unexpected conditions arising before or during the procedure. Questions have been answered to the satisfaction of the patient who accepts the risk and agrees to proceed as planned.    [employer] = Mandatory Field.  For non-mandatory fields, the provider will enter relevant positive findings.

## 2013-06-12 NOTE — Discharge Instructions (Signed)
SURGICAL DAY CARE PRE-OPERATIVE INSTRUCTIONS    DAY OF SURGERY    Arrive at: [facility] Registration on Sept 17,2014 arrive 7:30am  You must have a responsible adult available to accompany you home after surgery. (We suggest that you have someone available to assist you at home after your surgery).    INSTRUCTIONS:      You may have nothing to eat or drink after midnight the night before your surgery, not even water, gum or hard candy.      You may not smoke the morning of your surgery.     Please leave all valuables at home, including jewelry, watches, money, etc.     Please remove all fingernail polish before arriving at Surgical Day Care and do not  wear any face or lip make-up.     Do not shave surgical site.     Do not wear contact lenses.    MEDICATIONS:     Take the following medication the night before surgery at bedtime: ***    Take the following medication the morning of your surgery with only a sip of water: ***

## 2013-06-12 NOTE — H&P (Signed)
Urology H + P      HPI: The patient is a 52 year old female recent seen by Dr. Liou from urology for stress incontinence. She was  found to have leakage upon valsalva with a full bladder felt likely to be the result of a hypermobile urethra. There did not appear to be bladder overactivity and the capacity appears adequate as well. Surgical options were discussed and she elected injection therapy. Her stress incontinence is her only complaint. She denies any recent fevers, chills, abdominal or suprapubic pain, dysuria, hematuria or urinary frequency.      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    Comment: treated FHS 11/97 CSF VDRL neg 9/00 RPR 9/00 1:2 repeat RPR yearly     Trichomoniasis, unspecified 08/31/2004    Comment: 10/97    Nonspecific reaction to tuberculin skin test without active tuberculosis 08/31/2004    Comment: +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 - had negative CXR pp at [facility] referred to TB clinic    Nonspecific abnormal results of thyroid function study 08/31/2004    Comment: increased TSH 4.19 6/00; TSH nl 9/00; 05/04/99 - wnl    Urinary tract infection, site not specified 08/31/2004    Comment: 4/02, 6/02    Depressive disorder, not elsewhere classified 08/31/2004    Comment: 10/00 to Psych on Paxil, self d/c'd; 9/00 head CT: scattered punctate calcifications    Asthma     Presbyopia 04/23/2009    PVD (posterior vitreous detachment) 04/23/2009         Past Surgical History    LIG/TRNSXJ FLP TUBE ABDL/VAG APPR UNI/BI  1998    Comment Tubal Ligation    REDUCTION MAMMAPLASTY  2004    OB ANTEPARTUM CARE CESAREAN DLVR & POSTPARTUM      Comment c/section x 2    EXCISION EXCESSIVE SKIN & SUBQ TISSUE ABDOMEN      Comment abdominoplasty in [place]    BREAST BIOPSY & NEEDLE LOC WIR       Review of Patient's Allergies indicates:   Hydrocodone-acetami*    Nausea and Vomiting   Pcn [penicillins]           Comment:Got a urine infection after using    Family History    Diabetes Mother     Hypertension Mother     Diabetes  FamHxNeg     Cancer - Lung FamHxNeg     Cancer - Colon FamHxNeg      Social History:     Smoking status: Former Smoker     Quit date: 08/10/2004    Smokeless tobacco: Never Used    Comment: smoked only for 6 months.    Alcohol Use: Yes    Comment: sporadically       (Not in a hospital admission)    Review of Systems:    GEN: Negative for fevers, chills, night sweats, weight loss  CV: Negative for chest pain, palpatations  PULM: Negative for SOB, difficulty breathing, wheezing  GI: Negative for abd pain, nausea, vomiting, diarrhea, constipation, reflux, bloody stool  GU: Negative for dysuria, hematuria, suprapubic pain    Physical Exam:  VITALS: LMP 11/02/2009     GEN: NAD, awake and alert  CV: RRR  PULM: CTAB  ABD: soft, NT, ND  EXT: no edema or CT        A/P: The patient is a 52 year old female recently seen in clinic for stress incontinence and scheduled for cysto/injection therapy as treatment.         1. Check ua and cx   2. + Peri-op antibx      Yzabella Crunk,PA-C  Pager #: [phone]

## 2013-06-13 LAB — URINE CULTURE: URINE CULTURE/COLONY COUNT: NO GROWTH

## 2013-06-20 ENCOUNTER — Ambulatory Visit (HOSPITAL_BASED_OUTPATIENT_CLINIC_OR_DEPARTMENT_OTHER)
Admit: 2013-06-20 | Disposition: A | Discharge status: Home / Self Care | Payer: Self-pay | Source: Ambulatory Visit | Attending: Urology | Admitting: Urology

## 2013-06-20 MED ORDER — CEFAZOLIN SODIUM 1 G IJ SOLR
2.0000 g | Freq: Once | INTRAMUSCULAR | Status: AC
Start: 2013-06-20 — End: 2013-06-20
  Administered 2013-06-20: 2 g via INTRAVENOUS

## 2013-06-20 MED ORDER — ONDANSETRON HCL 4 MG/2ML IJ SOLN
INTRAMUSCULAR | Status: AC
Start: 2013-06-20 — End: 2013-06-20
  Filled 2013-06-20: qty 2

## 2013-06-20 MED ORDER — LACTATED RINGERS IV SOLN
1000.0000 mL | INTRAVENOUS | Status: DC
Start: 2013-06-20 — End: 2013-06-20

## 2013-06-20 MED ORDER — MIDAZOLAM HCL 2 MG/2ML IJ SOLN
INTRAMUSCULAR | Status: AC
Start: 2013-06-20 — End: 2013-06-20
  Filled 2013-06-20: qty 2

## 2013-06-20 MED ORDER — HYDROMORPHONE HCL PF 1 MG/ML IJ SOLN
0.2500 mg | INTRAMUSCULAR | Status: DC | PRN
Start: 2013-06-20 — End: 2013-06-20

## 2013-06-20 MED ORDER — DEXAMETHASONE SODIUM PHOSPHATE 4 MG/ML IJ SOLN
4.0000 mg | INTRAMUSCULAR | Status: DC | PRN
Start: 2013-06-20 — End: 2013-06-20

## 2013-06-20 MED ORDER — LACTATED RINGERS IV SOLN
INTRAVENOUS | Status: DC
Start: 2013-06-20 — End: 2013-06-20
  Administered 2013-06-20: 08:00:00 via INTRAVENOUS

## 2013-06-20 MED ORDER — METOCLOPRAMIDE HCL 5 MG/ML IJ SOLN
10.0000 mg | Freq: Once | INTRAMUSCULAR | Status: DC | PRN
Start: 2013-06-20 — End: 2013-06-20

## 2013-06-20 MED ORDER — ONDANSETRON HCL 4 MG/2ML IJ SOLN
4.0000 mg | Freq: Once | INTRAMUSCULAR | Status: AC | PRN
Start: 2013-06-20 — End: 2013-06-20
  Administered 2013-06-20: 4 mg via INTRAVENOUS
  Filled 2013-06-20: qty 2

## 2013-06-20 MED ORDER — CEFAZOLIN SODIUM 1 G IJ SOLR
INTRAMUSCULAR | Status: AC
Start: 2013-06-20 — End: 2013-06-20
  Filled 2013-06-20: qty 2000

## 2013-06-20 MED ORDER — PROPOFOL 10 MG/ML IV EMUL
INTRAVENOUS | Status: AC
Start: 2013-06-20 — End: 2013-06-20
  Filled 2013-06-20: qty 20

## 2013-06-20 MED ORDER — OXYCODONE-ACETAMINOPHEN 5-325 MG PO TABS
1.0000 | ORAL_TABLET | ORAL | Status: DC | PRN
Start: 2013-06-20 — End: 2013-06-20

## 2013-06-20 MED ORDER — PHENYLEPHRINE HCL 10 MG/ML IJ SOLN
INTRAMUSCULAR | Status: AC
Start: 2013-06-20 — End: 2013-06-20
  Filled 2013-06-20: qty 1

## 2013-06-20 MED ORDER — HYDROMORPHONE HCL PF 1 MG/ML IJ SOLN
INTRAMUSCULAR | Status: AC
Start: 2013-06-20 — End: 2013-06-20
  Filled 2013-06-20: qty 1

## 2013-06-20 NOTE — Op Note (Signed)
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    POSTOPERATIVE DIAGNOSES:  Stress urinary incontinence with intrinsic deficiency.    PROCEDURE:  Cystoscopy with injection at the bladder neck of Coaptite.    SURGEON:  Aquilla Hacker MD.    ASSISTANT:  None.    ANESTHESIA:  General.    OPERATIVE NARRATIVE:  This is a 52 year old woman who complained of incontinence and, subsequently, on both physical exam and urodynamics workup was determined that she had stress urinary incontinence with a degree of intrinsic sphincteric deficiency.  Subsequently, I did counsel her on all the options.  She has elected to undergo a cystoscopy with injection at the bladder neck.  She comes in today.  All the proper paperwork was performed.  She was prepped and draped in the usual sterile fashion after adequate induction of anesthesia and infusion of IV antibiotics.  Of note, she does claim that she has an allergy to PENICILLIN and the reaction was that she got a urinary tract infection after taking penicillin.  This does not appear to be a true allergy and; therefore, we did elect to proceed with administering cephalosporin as prophylactic antibiotic.    After adequate timeout, patient was then placed in dorsal lithotomy position.  The bladder was entered with a rigid cystoscope.  The cystoscope was pulled back to the bladder neck.  The injection needle was then placed at the 4 and 8 o'clock position at which time a total of approximately 0.5 mL of Coaptite was injected into the bladder neck.  There appeared to be good coaptation and, therefore, at that time, a decision was made not to inject any more and, at this time then, the bladder was emptied and a 16-French catheter was put in place just for the recovery room.  The patient was then brought out from under anesthesia in stable condition, taken to the recovery room.  Minimal blood loss.  No specimens sent to  pathology.  No complications and approximately 0.5 mL of Coaptite left at the bladder neck at the 4 and 8 o'clock position.    ___________________________  Reviewed and Electronically Signed By: Dionisio Paschal MD  Sig Date: 07/06/2013  Sig Time: 10:29:13  Dictated By: Dionisio Paschal MD  Dict Date: 06/20/2013 Dict Time: 11 10 AM    Dictation Date and Time:06/20/2013 11:10:06  Transcription Date and Time:06/20/2013 11:25:57  eScription Dictation id: [id number] Confirmation # :[id number]

## 2013-06-20 NOTE — Interval H&P Note (Signed)
Patient Assessment Update: (Fill out Prior to procedure or within 24 hours of  admission if H&P done pre-admission.)   Re-evaluation including history review and physical examination has been performed.    Patient's Condition No Change    Aquilla Hacker, MD, 06/20/2013, 9:00 AM

## 2013-06-20 NOTE — Brief Op Note (Signed)
Brief Procedure or Operative Note    Procedure: cystoscopy and injection of bladder neck material    Pre-Procedure Diagnosis:   SUI with ISD    Post-Procedure Diagnosis:  Same    Surgeon: Orest Dikes    Assistant:  None    Type of Anesthesia:   General = General Anesthesia    Findings:   lax BN    Estimated Blood Loss:   negligible    Specimens Removed:  None    Complications:  None    Other (e.g. Implants):  Other: Coaptite (at 4 and 8 oclock position with approx 500 ml of coaptite)        Aquilla Hacker, MD, 06/20/2013, 9:30 AM      Pager : [phone]                  .

## 2013-06-20 NOTE — Discharge Instructions (Signed)
Cystoscopy/Implant   (for urinary incontinence)    AFTER CARE:  Read the instructions outlined below and refer to this sheet in the next few weeks. These discharge instructions provide you with general information on caring for yourself after you leave the hospital. Your doctor may also give you specific instructions. While your treatment has been planned according to the most current medical practices available, unavoidable complications occasionally occur. If you have any problems or questions after discharge, call your caregiver.    ACTIVITY  · You may resume your regular activity.   · Talk with your caregiver about when you may return to work.   · You may shower.     NUTRITION  · You may resume your normal diet.   · Drink plenty of fluids (6-8 glasses a day).   · Eat a well-balanced diet.   · Call your doctor if you have persistent nausea or vomiting.     FEVER  · If you feel feverish or have shaking chills, take your temperature. If it is 102º F (38.9º C) or above, call your doctor at [phone] or go to the ER.  · Fever may mean there is an infection.   · You may take Tylenol for low-grade fever.    PAIN CONTROL  · You may take Vicodin 5/500mg 1-2 tablets every 4-6 hours as needed for pain. Try taking with food to avoid nausea. You may not drive while taking this narcotic  · You may also take over the counter Ibuprofen 600mg every 6 hours as needed for pain     SYMPTOMS AFTER SURGERY  You may experience one or more of the following symptoms after your implant procedure. These symptoms should be only temporary:  · Inability to urinate or a change in your usual pattern of urination.   · Slight bleeding at the urethra or blood in the urine. This should clear with time.  · Soreness in the injection site area.   · Nausea.       SEXUAL INTERCOURSE  Abstain from intercourse until you see us back in the office      CALL [phone] or RETURN TO THE ER FOR:  · Cloudy foul-smelling urine.   · Burning when you urinate    · Fever greater than 102° F (38.9º C). Chills.   · Joint pain or joint swelling.   · Skin rash or skin thickening  · Nausea or vomiting.   · Weakness or fatigue.   · You are unable to urinate.   · Dark wine colored urine

## 2013-06-20 NOTE — H&P (View-Only) (Signed)
Pre-Anesthetic Note    Pre op Diagnosis: Urinary incontinence    Planned Procedure: Sling    Patient ID:  Loretta Martin is a 52 year old female  Height: 5' 4"   Weight: 149 lb    Previous Anesthetic History:    TL    Reduction mammoplasty    C/S x2    Bilateral blepharoplasty    Abdominoplasty    Left breast lumpectomy     Patient denied complications of anesthesia.  Patient denied family complications of anesthesia.    PMHx:   Syphilis. Treated in 1997.    Trichomoniasis    +ve PPD. -ve CXR.    Chronic generalized pain and LBP    Depression    Asthma. Mild. Only uses inhaler when she gets an URTI   Very active. Works as house cleaner.    Current Medications:    fluticasone (FLONASE) 50 MCG/ACT nasal spray   PROAIR HFA 108 (90 BASE) MCG/ACT inhaler   gabapentin (NEURONTIN) 300 MG capsule   FLUoxetine (PROZAC) 20 MG capsule     Allergies:    Vicodin - N&V   PCN - UTI    Smoking, Alcohol, Drugs:  Smoking - None  Alcohol - None  Drugs - None    PHYSICAL EXAMINATION:  BP 106/81   Pulse 72   Temp(Src) 98 °F (36.7 °C) (Temporal)   Resp 18   Ht 5' 4" (1.626 m)   Wt 149 lb (67.586 kg)   BMI 25.56 kg/m2   SpO2 98%   LMP 11/02/2009    General: NAD    Airway Classification:  Mallampati: Class I    Oral Opening:  3cm  Full range of Neck Motion:  Yes  Teeth: Molar crowns    Lungs: Clear    Heart: No M    Pertinent Labs:   Component Value Date/Time   NA 138 05/12/2009  2:30 PM   K 4.8 05/12/2009  2:30 PM   WBC 5.6 05/13/2009  7:30 AM   HCT 37.8 05/13/2009  7:30 AM   CREAT 0.5 05/12/2009  2:30 PM   PLTA 193 05/13/2009  7:30 AM   PT 10.0 05/12/2009  2:30 PM   APTT 24.5 05/12/2009  2:30 PM   INR 1.0* 05/12/2009  2:30 PM     ASA Assessment: II     Potential Anesthesia Problems:  No    Plan:  GA    Kevin Danyla Wattley, 06/12/2013, 2:53 PM    Pager: [phone]        I have informed the patient of the nature and purpose of the type of anesthesia, the reasonable alternative anesthesia methods, pertinent foreseeable risks involved, and the possibility of  complications. I have explained that an alternative form of anesthesia may be required by unexpected conditions arising before or during the procedure. Questions have been answered to the satisfaction of the patient who accepts the risk and agrees to proceed as planned.    Bold Font = Mandatory Field.  For non-mandatory fields, the provider will enter relevant positive findings.

## 2013-06-20 NOTE — H&P (View-Only) (Signed)
Urology H + P      HPI: The patient is a 52 year old female recent seen by Dr. Orest Dikes from urology for stress incontinence. She was  found to have leakage upon valsalva with a full bladder felt likely to be the result of a hypermobile urethra. There did not appear to be bladder overactivity and the capacity appears adequate as well. Surgical options were discussed and she elected injection therapy. Her stress incontinence is her only complaint. She denies any recent fevers, chills, abdominal or suprapubic pain, dysuria, hematuria or urinary frequency.      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    Comment: treated FHS 11/97 CSF VDRL neg 9/00 RPR 9/00 1:2 repeat RPR yearly     Trichomoniasis, unspecified 08/31/2004    Comment: 10/97    Nonspecific reaction to tuberculin skin test without active tuberculosis 08/31/2004    Comment: +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 - had negative CXR pp at [facility] referred to TB clinic    Nonspecific abnormal results of thyroid function study 08/31/2004    Comment: increased TSH 4.19 6/00; TSH nl 9/00; 05/04/99 - wnl    Urinary tract infection, site not specified 08/31/2004    Comment: 4/02, 6/02    Depressive disorder, not elsewhere classified 08/31/2004    Comment: 10/00 to Psych on Paxil, self d/c'd; 9/00 head CT: scattered punctate calcifications    Asthma     Presbyopia 04/23/2009    PVD (posterior vitreous detachment) 04/23/2009         Past Surgical History    LIG/TRNSXJ FLP TUBE ABDL/VAG APPR UNI/BI  1998    Comment Tubal Ligation    REDUCTION MAMMAPLASTY  2004    OB ANTEPARTUM CARE CESAREAN DLVR & POSTPARTUM      Comment c/section x 2    EXCISION EXCESSIVE SKIN & SUBQ TISSUE ABDOMEN      Comment abdominoplasty in [place]    BREAST BIOPSY & NEEDLE LOC WIR       Review of Patient's Allergies indicates:   Hydrocodone-acetami*    Nausea and Vomiting   Pcn [penicillins]           Comment:Got a urine infection after using    Family History    Diabetes Mother     Hypertension Mother     Diabetes  FamHxNeg     Cancer - Lung FamHxNeg     Cancer - Colon FamHxNeg      Social History:     Smoking status: Former Smoker     Quit date: 08/10/2004    Smokeless tobacco: Never Used    Comment: smoked only for 6 months.    Alcohol Use: Yes    Comment: sporadically       (Not in a hospital admission)    Review of Systems:    GEN: Negative for fevers, chills, night sweats, weight loss  CV: Negative for chest pain, palpatations  PULM: Negative for SOB, difficulty breathing, wheezing  GI: Negative for abd pain, nausea, vomiting, diarrhea, constipation, reflux, bloody stool  GU: Negative for dysuria, hematuria, suprapubic pain    Physical Exam:  VITALS: LMP 11/02/2009     GEN: NAD, awake and alert  CV: RRR  PULM: CTAB  ABD: soft, NT, ND  EXT: no edema or CT        A/P: The patient is a 52 year old female recently seen in clinic for stress incontinence and scheduled for cysto/injection therapy as treatment.  1. Check ua and cx   2. + Peri-op antibx      Cecilie Kicks  Pager #: [phone]

## 2013-06-20 NOTE — Interval H&P Note (Signed)
NPO past midnight.  No GERD.  Allergies to Vicodin and PCN reviewed.  Patient states that she is able to take Percocet without difficulty.    Will proceed with anesthesia.    Debbra Riding, MD  06/20/2013  08:49  Pager: [phone]

## 2013-07-03 ENCOUNTER — Ambulatory Visit (HOSPITAL_BASED_OUTPATIENT_CLINIC_OR_DEPARTMENT_OTHER): Payer: No Typology Code available for payment source | Admitting: Urology

## 2013-07-03 DIAGNOSIS — N393 Stress incontinence (female) (male): Secondary | ICD-10-CM

## 2013-07-03 LAB — MEAS POST-VOIDING RESIDUAL URINE&/BLADDER CAP: Post Void Residual: 0 (ref 0–0)

## 2013-07-03 NOTE — Progress Notes (Signed)
Post BN injection  Doing well  No leakage      AP  FU in 3 months to check PVR, UA, UDS questionnaire  I have spent more than 10/15 minutes in face to face counseling time with this patient and have answered all questions concerning the issues above.

## 2013-07-04 ENCOUNTER — Ambulatory Visit: Payer: Self-pay | Admitting: Internal Medicine

## 2013-07-04 ENCOUNTER — Ambulatory Visit (HOSPITAL_BASED_OUTPATIENT_CLINIC_OR_DEPARTMENT_OTHER): Payer: No Typology Code available for payment source | Admitting: Internal Medicine

## 2013-07-04 ENCOUNTER — Encounter (HOSPITAL_BASED_OUTPATIENT_CLINIC_OR_DEPARTMENT_OTHER): Payer: Self-pay | Admitting: Internal Medicine

## 2013-07-04 VITALS — BP 100/70 | HR 73 | Temp 98.6°F | Wt 146.8 lb

## 2013-07-04 DIAGNOSIS — M25572 Pain in left ankle and joints of left foot: Secondary | ICD-10-CM

## 2013-07-04 LAB — XR ANKLE LEFT MINIMUM 3 VIEWS

## 2013-07-04 NOTE — Progress Notes (Signed)
SUBJECTIVE:    Loretta Martin is a 52 y.o who presents to clinic with c/c of left ankle pain x 1 year  Located her left ankle pain at the lateral aspect of her ankle.   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 Denies head trauma  Pain increases when walking up stairs.   Denies swelling, redness, numbness and tingling.       OBJECTIVE:  General: A+0x3,NAD  BP 100/70   Pulse 73   Temp(Src) 98.6 F (37 C)   Wt 146 lb 12.8 oz (66.588 kg)   BMI 25.19 kg/m2   SpO2 99%   LMP 11/02/2009  Ankle exam - both sides normal; full range of motion, no pain on motion, no effusion, tenderness, ligamentous instability or deformity noted.  Steady gait.       ASSESSMENT/PLAN  (719.47) Left ankle pain  (primary encounter diagnosis)  Comment: chronic left ankle pain  May have been a sprain of ankle which has not completely healed  Will also send for x ray to r/o fracture ( per pt has fallen down due to instabilty of her ankle)  Will refer to PT and Orthopedics  Plan: XR ANKLE LEFT MINIMUM 3 VW, REFERRAL TO         PHYSICAL THERAPY ( INT), REFERRAL TO         ORTHOPEDICS ( INT)

## 2013-07-09 DIAGNOSIS — I8390 Asymptomatic varicose veins of unspecified lower extremity: Secondary | ICD-10-CM | POA: Insufficient documentation

## 2013-07-18 ENCOUNTER — Ambulatory Visit (HOSPITAL_BASED_OUTPATIENT_CLINIC_OR_DEPARTMENT_OTHER): Payer: No Typology Code available for payment source | Admitting: Primary Podiatric Medicine

## 2013-07-18 DIAGNOSIS — M79609 Pain in unspecified limb: Secondary | ICD-10-CM

## 2013-07-18 DIAGNOSIS — B351 Tinea unguium: Secondary | ICD-10-CM

## 2013-07-18 MED ORDER — TERBINAFINE HCL 250 MG PO TABS
250.00 mg | ORAL_TABLET | Freq: Every day | ORAL | Status: AC
Start: 2013-07-18 — End: 2013-10-16

## 2013-07-18 NOTE — Progress Notes (Signed)
See dictation

## 2013-07-18 NOTE — Progress Notes (Signed)
Date of Service: 07/18/2013    SUBJECTIVE:  This very pleasant 52 year old female patient, last seen on July 18, 2012, returns for reevaluation of fungus toenails at both great toes.  Previous PAS stain testing on nail fragments taken from the left great toe did reveal presence of fungal organisms.  I did give her a prescription for the oral Lamisil tablets in October 2013, but she reports today that she never was able to get the medication, apparently not covered by her insurance plan.  She still would like to try the oral terbinafine hydrochloride tablets for the nail fungus.  In review, she may have taken an oral antifungal in [place] prior to 2013 and had some temporary partial improvement of the nail fungus.    PHYSICAL EXAMINATION:  The patient is alert and oriented, in no acute distress.  She does have yellow nail plate discoloration with minimal thickening at the distal aspect of both great toes with scant surface white onychomycotic nail plate changes at the left great toenail plate at the distal half.  There is scant subungual debris at both great toenails at the distal nail bed.  Other nail plates have more normal pink translucent color and thickness.  Dorsalis pedis and posterior tibial pulses +2/4.  Web spaces are clean and maceration free.    ASSESSMENT:  Clinical onychomycosis at both great toenails with minimal pain.    PLAN:  Today, I did discuss with patient again trying a prescription for oral terbinafine hydrochloride tablets, and I have given her a printed prescription with the recommendation that she have the prescription filled through [facility] where she can pay directly out of pocket, generally for approximately $4 per 30 tablets, and she would like to do that.  I did discuss with the patient that there is an interaction warning on Epic with her fluoxetine with terbinafine where the level of fluoxetine medication in her blood can increase with concurrent taking of the oral  terbinafine hydrochloride tablets.  I did discuss with patient to discontinue the oral terbinafine if she has any unusual adverse side effects.  I have instructed the patient that she needs to come back to see me again in 5-6 weeks to perform simple hepatic function test.  I did discuss with her not to expect any significant improvement in the nail plates for at least 10 months.    ___________________________  Reviewed and Electronically Signed By: Renae Fickle Krysten Veronica DPM  Sig Date: 07/19/2013  Sig Time: 12:48:26  Dictated By: Renae Fickle Sulema Braid DPM  Dict Date: 07/18/2013 Dict Time: 12 12 PM    Dictation Date and Time:07/18/2013 12:12:25  Transcription Date and Time:07/18/2013 13:29:48  eScription Dictation id: [id number] Confirmation # :[id number]

## 2013-07-20 ENCOUNTER — Ambulatory Visit (HOSPITAL_BASED_OUTPATIENT_CLINIC_OR_DEPARTMENT_OTHER): Payer: No Typology Code available for payment source | Admitting: Rehabilitative and Restorative Service Providers"

## 2013-07-20 DIAGNOSIS — M25572 Pain in left ankle and joints of left foot: Secondary | ICD-10-CM

## 2013-07-20 NOTE — Progress Notes (Signed)
OUTPATIENT EVALUATION    REFERRING PROVIDER: Carolyn Swaziland  [address]  [facility]  [place], [place] [address]   Hx OF PRESENT ILLNESS: 52 y.o. Female patient report sthat she has had left ankle pain for about one year, that pain at times is very strong, and that she cannot walk then. Pt. States that she has had this pain for about 1 year. Pt. Also reports that she falls sometimes, the last time she fell was 3 months ago, and she appears to think that falling was related to ankle weakness on left. She states that she fell outside when it happened and has not fallen again in last 3 months.   PRECAUTIONS: na      LEARNS BEST: Demo   MENTAL STATUS/COMMUNICATION: WNL     PRIMARY LANGUAGE: [place]     REQUIRES INTERPRETER: No   INTERPRETER PRESENT DURING EVAL: No                              ASSESSMENT:     Please Note: Only populated fields were assessed by provider, fields left blank were not assessed.    Functional:   DRESSING/GROOMING: WNL  DRIVING: IMPAIRED: min. difficulties due to left hand numbness at times.  SLEEPING: ok  POSTURE/ALIGNMENT: Pt. Has decreased arch left foot, has more Pronation   GAIT: Pt. Walks with decreased heel strike and push off in left foot.  ADL's: Pt. Has difficulties with all daily acts. which she needs to walk for.  WORK/SCHOOL: not working right now due to having had surgery for bladder      NEUROLOGICAL:  WNL  Sensation: intact  Radiating Pain: none    PALPATION: Tender to palpation over bilateral malleoli of left ankle, worse on medial side; TTP in inferior part of both malleoli, and in posterior tibialis tendon; min. In achilles tendon    SKIN INTEGRITY:   DRY & INTACT    PAIN: 9/10 today; sometimes no pain , but at times has 10/10 pain as he states, and then is unable to walk and stays in bed.  Standing ok; walking makes pain worse, also walking on stairs makes the pain worse  Sitting or lying down makes pain better.    USE IMAGES ACTIVITY. IMAGE  AVAILABLE: No    ROM/STRENGTH:     LEFT RIGHT  LEFT RIGHT    A/PROM MMT A/PROM MMT  A/PROM MMT A/PROM MMT   SHOULD     HIP       FLEX.     FLEX.       EXT.     EXT.       IR     IR       ER     ER       ABD     ABD       H. ABD     ADD       H. ADD     KNEE       ELBOW     FLEX       FLEX.     EXT       EXT.     ANKLE       WRIST     DF 0 4-/5     FLEX.     PF 65 4-/5     EXT.     IV 45 3+/5 with pain Pain with Crista Curb. And  Invers, both ways pain in medial malleoli area    SUP/  PRON     EV 15 3+/5 with pain         FF  ext Lateral flex R/L Rotation R Rotation L      LUMBAR           Cervical                      SPECIAL TEST LEFT RIGHT SPECIAL TEST LEFT RIGHT SPECIAL TEST LEFT RIGHT                                                       OTHER: bilateral heel raises possible, but pain in both sides of ankle, worse with forefoot raising which makes pain worse than heel raises as Pt. states.      Physical Therapy Plan of Care    ZO:XWRUE Chilton Si, MD  Referring Provider: Carolyn Swaziland, APRN  Diagnosis: No diagnosis found. left ankle pain    Assessment/Objective Findings: Patient is a 52 year old female who reports to PT with a 1 year history of left ankle pain which has worsened since 3 months when she fell outside which she thinks might have been connected with the weakness and pain in her left foot. Pt. Has decreased ROM in left ankle Dorsiflexion and has decreased strength, worse  in laterally stabilizing  mms of left ankle/foot. Pt. Has pain with eversion and Inversion when tested in eval. Pt.has increased pain with walking on even surfaces and stairs.  Pt. Needs skilled PT for pain management activities, stretching into DF, stabilizing strengthening therex. especially for lateral stabilizing mms. Proprioceptive therex. Left ankle/foot and left LE, functional-and gait training    Patient would benefit from skilled PT to address above issues.      Chief complaint of pain  inleft Ankle.        Pain, Decreased ROM,  Decreased Strength, Decreased Functional Mobility, Decreased Joint Mobility and Decreased Tolerance of ADLs  Patient will benefit from skilled PT to address the rehabilitation goals outlined below.    Rehabilitation Goals  Pt. will demonstrate increased ankle ROM by 75% throughout.   Duration: 4 weeks  Pt. will demonstrate increase left ankle strength by 1 grade throughout.   Duration: 6 weeks  Pt. will demonstrate ability to ambulate with pain free, normalized gait pattern.   Duration: 6 weeks  Pt completes strengthening program pain free.   Duration: 4 weeks  Average ankle pain will be reduced from a 9/10 to a 3/10. Duration: 4 weeks  Patient to be Independent with Home Exercise Program.  Duration: 6 weeks     Patient will benefit from skilled PT to address the rehabilitation goals outlined below.    Treatment Plan: ** Stretching/ROM Exercise  ** Therapeutic Exercise  ** Home Exercise Program  ** Soft Tissue Mobilization  ** Ultrasound  ** Electrical Stimulation/TENS  ** Hot/Cold Rx  ** Gait Training  ** Functional Activities  ** Patient Education  Long Term Goal: Pt. To be able to walk all functional distances on even surfaces and stairs with no pain or minimal pain only      Recommend Physical Therapy be continued 2 times per week for 6 weeks.  The rehabilitation potential for this patient is good  Patient is agreeable to treatment plan and is aware of attendance policy.    Greg Cratty, PT

## 2013-07-24 NOTE — Progress Notes (Signed)
I certify that the documented Treatment Plan is reasonable and necessary.    07/24/2013  Bethanny Toelle Swaziland, APRN

## 2013-07-24 NOTE — Patient Instructions (Signed)
Rehabilitation Treatment Flowsheet    Precautions:    Date: 07/24/13         Initials: SB         Visit #: 1         POC Due Date: 08/24/13         Time: 71'         HEP x         Treatment(s)                 Eval./ed re findings and Rx.                T-band therex. for Inversion and Eversion suspensed and practiced

## 2013-07-24 NOTE — Progress Notes (Signed)
I certify that the documented Treatment Plan is reasonable and necessary.    07/24/2013  Arlice Colt, MD

## 2013-07-25 ENCOUNTER — Ambulatory Visit (HOSPITAL_BASED_OUTPATIENT_CLINIC_OR_DEPARTMENT_OTHER): Payer: No Typology Code available for payment source | Admitting: Rehabilitative and Restorative Service Providers"

## 2013-07-25 DIAGNOSIS — M25572 Pain in left ankle and joints of left foot: Secondary | ICD-10-CM

## 2013-07-25 NOTE — Progress Notes (Signed)
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  O: See flow sheet.  Pt 35' late.  A: Initiated manual stretching and educated pt on strengthening with TB, and issued RTB for HEP. Pt requires cueing on SLS to slightly flex knee; pt required cueing to demonstrate exercises properly.  Educated pt on using TAs to improve balance; pt verbalized understanding.  P: Continue per PT POC

## 2013-07-25 NOTE — Patient Instructions (Addendum)
Rehabilitation Treatment Flowsheet    Precautions:    Date: 07/24/13 07/25/2013        Initials: SB csb        Visit #: 1 2        POC Due Date: 08/24/13         Time: 21' 30'        HEP x         Treatment(s)                 Eval./ed re findings and Rx.                T-band therex. for Inversion and Eversion suspensed and practiced X reviewed and added DF/PF                Leg press  3 pls  3 x 10                Calf press  3 pls  3 x 5                gastroc stretch on wedge  3 x 30"                gastroc stretch on stairs  30"                L SLS   3 x 15 with UE suport                Ed on TA bracing for all activites.

## 2013-07-27 ENCOUNTER — Ambulatory Visit (HOSPITAL_BASED_OUTPATIENT_CLINIC_OR_DEPARTMENT_OTHER): Payer: No Typology Code available for payment source | Admitting: Physical Medicine & Rehabilitation

## 2013-07-30 ENCOUNTER — Ambulatory Visit (HOSPITAL_BASED_OUTPATIENT_CLINIC_OR_DEPARTMENT_OTHER): Payer: No Typology Code available for payment source | Admitting: Rehabilitative and Restorative Service Providers"

## 2013-07-30 DIAGNOSIS — M25579 Pain in unspecified ankle and joints of unspecified foot: Secondary | ICD-10-CM

## 2013-07-30 NOTE — Progress Notes (Signed)
.  S: Pt report compliance c HEP, continued pain, and that she hopes to feel better for her son's wedding next month, and she's worried about the mother/son dance.  O: See flow sheet  A:  Educated pt on proper footwear; pt verbalized understanding.  Progressed strengthening and educated pt to ice after exercises and after work.  Pt notes relief with k tape.  P: Continue per PT POC

## 2013-07-30 NOTE — Patient Instructions (Signed)
Rehabilitation Treatment Flowsheet    Precautions:    Date: 07/24/13 07/25/2013 07/30/2013       Initials: SB csb csb       Visit #: 1 2 3        POC Due Date: 08/24/13         Time: 79' 30' 30'       HEP x  x       Treatment(s)   8/10              Eval./ed re findings and Rx.  Upright bike 5'              T-band therex. for Inversion and Eversion suspensed and practiced X reviewed and added DF/PF K tape Y strip to medial malleoli               Leg press  3 pls  3 x 10 x               Calf press  3 pls  3 x 5 x               gastroc stretch on wedge  3 x 30" x               gastroc stretch on stairs  30" SLS clocks  10x c 5" hold               L SLS   3 x 15 with UE suport SLS clocks on airex  10x  No hold               Ed on TA bracing for all activites. Ice   Elevated 10'

## 2013-08-03 ENCOUNTER — Ambulatory Visit (HOSPITAL_BASED_OUTPATIENT_CLINIC_OR_DEPARTMENT_OTHER): Payer: No Typology Code available for payment source | Admitting: Rehabilitative and Restorative Service Providers"

## 2013-08-03 DIAGNOSIS — M25579 Pain in unspecified ankle and joints of unspecified foot: Secondary | ICD-10-CM

## 2013-08-03 NOTE — Progress Notes (Signed)
..  S: Pt report compliance c HEP, continued pain, but states that pain is min. Better to 5/10.  O: See flow sheet  A: Pt. Responded to foot wear ed. And wearing sneakers today. Was also educated on getting Orthotics for more support. pt verbalized understanding.  Progressed strengthening and educated pt to ice after exercises and after work.  Pt. States that she took off K-tape that she thinks that it did not make much difference. Pt. Had no c/o pain during exercises.  P: Continue per PT POC

## 2013-08-03 NOTE — Patient Instructions (Addendum)
Rehabilitation Treatment Flowsheet    Precautions:    Date: 07/24/13 07/25/2013 07/30/2013 08/03/13      Initials: SB csb csb SB      Visit #: 1 2 3 4       POC Due Date: 08/24/13         Time: 98' 30' 30' 30' with ice      HEP x  x       Treatment(s)   8/10 5/10             Eval./ed re findings and Rx.  Upright bike 5' x             T-band therex. for Inversion and Eversion suspensed and practiced X reviewed and added DF/PF K tape Y strip to medial malleoli x              Leg press  3 pls  3 x 10 x X leg press 2x20 with 4 pl.              Calf press  3 pls  3 x 5 x   And calf press with 3 pl 3x10              gastroc stretch on wedge  3 x 30" x X on wall and soleus stretch x3 each, 20 s. hold              gastroc stretch on stairs  30" SLS clocks  10x c 5" hold Tandem standing on foam pad 3 for 30 s. With left foot front and back              L SLS   3 x 15 with UE suport SLS clocks on airex  10x  No hold On foam pad 5x20    Wobble board a/p and lateral 20 each with min. Hand hold on bar              Ed on TA bracing for all activites. Ice   Elevated 10' x

## 2013-08-06 ENCOUNTER — Ambulatory Visit (HOSPITAL_BASED_OUTPATIENT_CLINIC_OR_DEPARTMENT_OTHER): Payer: No Typology Code available for payment source | Admitting: Rehabilitative and Restorative Service Providers"

## 2013-08-06 DIAGNOSIS — M25579 Pain in unspecified ankle and joints of unspecified foot: Secondary | ICD-10-CM

## 2013-08-06 NOTE — Progress Notes (Signed)
.  S: Pt reports compliance c HEP  O: See flow sheet  A:  Continued strengthening; pt demonstrates improved strength and balance on all exercises. Pt continues to wear sneakers for improved support, and hopes to get orthotics and shoes for the wedding soon.  P: Continue per PT POC.

## 2013-08-06 NOTE — Progress Notes (Signed)
08/06/13 0852   Language Information   Language of Care English   Time Calculation   Start Time 0735   Stop Time 0800   Time Calculation (min) 25 min   Pain   Pain Score 4    Ther Exercise 2   Exercise leg press  4 pls   Reps 2 25   Sets 2 2   Ther Exercise 3   Exercise calf press 3 pls   Reps 3 10   Sets 3 3   Ther Exercise 4   Exercise wobble board AP/ML 3 x 10" each   Ther Exercise 5   Exercise clock on airex 5x with 5" hold   Ther Exercise 6   Exercise step up on bosu   Reps 6 10   Sets 6 2   Ther Exercise 7   Exercise resisted gait #65 FW/BW   Reps 7 5   Weight 7 65   Cold pack   Joints Ankle   Position (elevated, post treatment)   Patient Education   What was taught? y   Method Practice   Patient comprehension Yes

## 2013-08-13 ENCOUNTER — Ambulatory Visit (HOSPITAL_BASED_OUTPATIENT_CLINIC_OR_DEPARTMENT_OTHER): Payer: No Typology Code available for payment source | Admitting: Rehabilitative and Restorative Service Providers"

## 2013-08-13 DIAGNOSIS — M25372 Other instability, left ankle: Secondary | ICD-10-CM | POA: Insufficient documentation

## 2013-08-13 NOTE — Progress Notes (Signed)
.  S: Pt reports compliance c HEP and being pain free, and the wedding is this Saturday.  O: See flow sheet  A:  Progressed strengthening and proprioception with use of airex; pt performs all exercises without increased pain.  Reviewed footwear education with pt, avoiding high heels at wedding. Pt verbalized understanding.  P: Continue per PT POC

## 2013-08-13 NOTE — Progress Notes (Signed)
08/13/13 0807   Visit number   Visit number 6   Pain   Pain Score 0    Ther Exercise   Exercise lat stepping with GTB   Ther Exercise 2   Exercise leg press LLE 3 pls 3 x 10   Ther Exercise 3   Exercise calf press LLE 2 pls 3 x 10   Ther Exercise 5   Exercise clock on airex 10x with 5" hold   Ther Exercise 6   Exercise lateral stepping with GTB 4 x 30'   Ther Exercise 7   Exercise resisted gait #80 FW/BW   Weight 7 80   Ther Exercise 8   Exercise LLE SL ball toss 2 x 10   Ther Exercise 9   Exercise tandem on airex ball toss #10   Cold pack   Joints Ankle   Time in minutes 10   Patient Education   What was taught? appropriate footwear   Method Verbal   Patient comprehension Yes

## 2013-08-14 ENCOUNTER — Telehealth (HOSPITAL_BASED_OUTPATIENT_CLINIC_OR_DEPARTMENT_OTHER): Payer: Self-pay | Admitting: Internal Medicine

## 2013-08-14 ENCOUNTER — Other Ambulatory Visit (HOSPITAL_BASED_OUTPATIENT_CLINIC_OR_DEPARTMENT_OTHER): Payer: Self-pay | Admitting: Internal Medicine

## 2013-08-14 DIAGNOSIS — I8390 Asymptomatic varicose veins of unspecified lower extremity: Secondary | ICD-10-CM

## 2013-08-14 NOTE — Progress Notes (Signed)
Referral to vascular surgery at [facility]

## 2013-08-15 NOTE — Progress Notes (Signed)
[facility] Pharmacy, Nohelani Benning is a 52 year old female has requested a refill of fluoxetine 20 mg.      Last Office Visit: 07/18/2013  Last Physical Exam: 10/23/2012      Other Med Adult:  Most Recent BP Reading(s)  07/04/13 : 100/70      Cholesterol (mg/dl)   Date  Value    03/07/5408  207*   ----------  LDL DIRECT (mg/dl)   Date  Value    05/14/9146  130*   ----------  HDL (mg/dl)   Date  Value    06/01/5620  57    ----------  TRIGLYCERIDES (mg/dl)   Date  Value    30/05/6577  79    ----------      THYROID SCREEN TSH REFLEX FT4 (uIU/mL)   Date  Value    10/31/2008  2.83    ----------      TSH (THYROID STIM HORMONE) (uIU/ml)   Date  Value    08/10/2005  3.36    ----------    HEMOGLOBIN A1C (%)   Date  Value    10/23/2012  4.8    ----------      INR (no units)   Date  Value    05/12/2009  1.0*   ----------       Documented patient preferred pharmacies:    [facility] [address] - [place], [place] [address]  Phone: [phone] Fax: [phone]

## 2013-08-15 NOTE — Progress Notes (Signed)
Called patient and left message.  Why [facility]? Can we encourage her to use a better service

## 2013-08-15 NOTE — Progress Notes (Signed)
Called and spoke with pt. States was told she actually does not need a referral with [facility] but looks like she has [facility]. Asked if she could consider [facility], as they are affiliated with [place]. She wants to go to "[facility]" in [place] "they know me and it's convenient". Asked if she would consider a [facility] location if close to home. States she wants [facility].

## 2013-08-20 ENCOUNTER — Ambulatory Visit (HOSPITAL_BASED_OUTPATIENT_CLINIC_OR_DEPARTMENT_OTHER): Payer: No Typology Code available for payment source | Admitting: Physical Medicine & Rehabilitation

## 2013-08-22 ENCOUNTER — Ambulatory Visit (HOSPITAL_BASED_OUTPATIENT_CLINIC_OR_DEPARTMENT_OTHER): Payer: No Typology Code available for payment source | Admitting: Rehabilitative and Restorative Service Providers"

## 2013-08-22 ENCOUNTER — Ambulatory Visit (HOSPITAL_BASED_OUTPATIENT_CLINIC_OR_DEPARTMENT_OTHER): Payer: No Typology Code available for payment source | Admitting: Primary Podiatric Medicine

## 2013-08-28 ENCOUNTER — Ambulatory Visit (HOSPITAL_BASED_OUTPATIENT_CLINIC_OR_DEPARTMENT_OTHER): Payer: No Typology Code available for payment source | Admitting: Rehabilitative and Restorative Service Providers"

## 2013-09-05 ENCOUNTER — Ambulatory Visit (HOSPITAL_BASED_OUTPATIENT_CLINIC_OR_DEPARTMENT_OTHER): Payer: No Typology Code available for payment source | Admitting: Rehabilitative and Restorative Service Providers"

## 2013-09-10 ENCOUNTER — Ambulatory Visit (HOSPITAL_BASED_OUTPATIENT_CLINIC_OR_DEPARTMENT_OTHER): Payer: No Typology Code available for payment source | Admitting: Primary Podiatric Medicine

## 2013-09-10 ENCOUNTER — Ambulatory Visit (HOSPITAL_BASED_OUTPATIENT_CLINIC_OR_DEPARTMENT_OTHER): Payer: No Typology Code available for payment source | Admitting: Physical Medicine & Rehabilitation

## 2013-09-10 ENCOUNTER — Encounter (HOSPITAL_BASED_OUTPATIENT_CLINIC_OR_DEPARTMENT_OTHER): Payer: Self-pay | Admitting: Primary Podiatric Medicine

## 2013-09-10 DIAGNOSIS — B351 Tinea unguium: Secondary | ICD-10-CM

## 2013-09-10 DIAGNOSIS — M25572 Pain in left ankle and joints of left foot: Secondary | ICD-10-CM

## 2013-09-10 DIAGNOSIS — M79609 Pain in unspecified limb: Secondary | ICD-10-CM

## 2013-09-10 DIAGNOSIS — M25372 Other instability, left ankle: Secondary | ICD-10-CM

## 2013-09-10 LAB — ALANINE AMINOTRANSFERASE: ALANINE AMINOTRANSFERASE: 29 U/L (ref 12–45)

## 2013-09-10 LAB — ASPARTATE AMINOTRANSFERASE: ASPARTATE AMINOTRANSFERASE: 29 U/L (ref 8–34)

## 2013-09-10 NOTE — Addendum Note (Signed)
Addended by: Cristine Polio on: 09/10/2013 03:08 PM     Modules accepted: Orders

## 2013-09-10 NOTE — Progress Notes (Signed)
Date of Service: 09/10/2013    The patient was referred from her primary care provider's office, Dr. Arlice Colt, for left ankle pain.    The patient complains of persistent over a year left ankle pain, progressively getting worse, aggravated after prolonged standing, ambulation, alleviated by rest.    The patient reports some instability in the left ankle, twisted foot several times.    While sitting in the room, patient reports no pain at all.     There are no sensation changes in the legs/foot, and no obvious weakness.    PAST MEDICAL HISTORY:  Significant for vitamin D deficiency, neck pain, left ankle instability, irritable bowel syndrome, stress incontinence, depression, carpal tunnel syndrome, chronic low back pain, cervical radiculopathy.    MEDICATIONS:  Include occasionally Motrin/Advil, multivitamins, Prozac, Flonase, Pro-air, vitamin D, Neurontin.    ALLERGIES:  Hydrocodone, penicillin.    SOCIAL HISTORY:  The patient works Education officer, environmental houses.  She is married, does not smoke or use alcohol.  The patient drives a car.  Family lives in [place].    The rest of the medical history, family history, social history, and review of systems - as per EPIC and encounter form.    PHYSICAL EXAMINATION:  Pleasant, cooperative 52 years old female, appears not in pain or distress, sitting comfortably in examination room.  There is no obvious deformity, her skin is intact over upper, lower limbs/torso.  There is no lymphadenopathy, good capillary refill on extremities.  The patient ambulated with symmetrical gait without assistive devices with normal gait pattern.  On inspection left ankle, there is no obvious deformity, full range of motion, appear laxity of the lateral collateral ligaments, but maximal inversion/eversion, dorsiflexion/plantar flexors does not reproduce the pain.    Strength of the foot plantar/dorsi flexors/everters not affected.  On palpation, there is mild diffuse tenderness along the ankle joint  line, no significant tenderness, no palpable abnormalities over lateral collateral ligaments.    Stretching lateral collateral ligaments does not reproduce the pain.    X-ray of the left ankle from October 1st was not remarkable.    IMPRESSION:  The patient's presentation with left ankle pain could be   consistent with laxity, sprain of the lateral collateral ligaments. Not clinically, no radiologically do I appreciate structural ankle abnormalities.  I recommended elastic ankle support and referred her for a second opinion to orthopedics.  I will see her as needed.    ___________________________  Reviewed and Electronically Signed By: Cristine Polio MD  Sig Date: 09/10/2013  Sig Time: 20:52:44  Dictated By: Cristine Polio MD  Dict Date: 09/10/2013 Dict Time: 04 01 PM    Dictation Date and Time:09/10/2013 16:01:51  Transcription Date and Time:09/10/2013 20:32:18  eScription Dictation id: [id number] Confirmation # :[id number]      cc: Arlice Colt MD

## 2013-09-10 NOTE — Progress Notes (Signed)
The primary progress note for this visit has been dictated through E-Scription. It can be viewed as an attachment to this encounter or through Chart Review under the Notes Tab as an Orthopedic Office Note.      Review of Systems: Constitutional, Eyes, ENT/Mouth, Cardiovascular, Respiratory, GI, GU, Neuro, Psych, Heme/Lymph, Skin, Musculoskeletal was reviewed and is NEGATIVE except for what is dictated in the note.    MT ACCT #:  [id number]

## 2013-09-10 NOTE — Progress Notes (Signed)
Date of Service: 09/10/2013    SUBJECTIVE:  This very pleasant 52 year old female patient returns for followup evaluation of nail fungus primarily at both great toenail plates, describing that she has now completed approximately 6 weeks of a 12-week course of oral Lamisil therapy with no significant adverse side effect.  In review, the patient has had thickened nails at especially both great toes.  Previous PAS stain testing on nail fragments from the left great toe did reveal presence of fungal organisms.  She did purchase the prescription through [facility]; that was only $4 per 30-day supply.    PHYSICAL EXAMINATION:  The patient is alert and oriented, in no acute distress.  She does have nail polish present on all of her nail plates with a light clear color.  She does have some light yellow nail plate discoloration and minimal thickening still present through the nail polish at both great toes.  It is still too early to expect to see any significant improvement in the nail plates.  There is some scant distal subungual debris at both great toenails.  No localized surrounding nail fold edema or erythema.  Dorsalis pedis and posterior tibial pulses +2/4.  Web spaces are clean and maceration free.  Note is made of patient's recent physical therapy sessions for apparent left lateral ankle pain and instability, and she is still doing exercises.  She does report having problems with left ankle pain and several falls in the last year with a repeat inversion type ankle sprains.  Today, she is wearing a pair of knee high dress-type winter boots that have a very narrow unstable midfoot and heel area.    PHYSICAL EXAMINATION:  Web spaces today are clean and maceration free.  She does have some mild pain with deep palpation primarily over the anterior talofibular ligament and at the sinus tarsi area of the left lateral ankle.  She does have lowering of the medial longitudinal arch in relaxed stance and does have greater  difficulty with a single leg stance on the left side compared to the right.  No localized edema present at the left ankle or foot.  She has no significant pain today with inversion or eversion at the left subtalar joint against resistance.    ASSESSMENT:  Clinical onychomycosis at both great toenails with minimal pain.  Left lateral ankle pain/instability.    PLAN:  Today, I did discuss with patient completion of the 79-month course of oral Lamisil tablets as tolerated, and not to expect any significant improvement in the nail plates for at least another 6 months.  I did send her to lab on her way home today to repeat AST and ALT to make sure no significant elevation in the liver enzymes.  I have asked her to come back to see me again in approximately 3 months to recheck the nail plate areas.  I did discuss with patient that physical therapy sessions as well as strengthening and stretching exercises and proprioception exercises at home are very important in trying to prevent future inversion ankle sprains.  I did discuss with her that the winter boots worn today are extremely unstable, especially in the frontal plane and do put her at increased risk for further inversion type injury.  She does describe having [employer] athletic shoes at home that she uses for work, and I did discuss with her in future that athletic type shoes or sneakers should have wide more straight lasted construction for better stability.  She would also likely  benefit with additional stabilizing orthotic type devices.    ___________________________  Reviewed and Electronically Signed By: Renae Fickle Tevion Laforge DPM  Sig Date: 09/12/2013  Sig Time: 13:49:31  Dictated By: Renae Fickle Ajamu Maxon DPM  Dict Date: 09/10/2013 Dict Time: 04 36 PM    Dictation Date and Time:09/10/2013 16:36:48  Transcription Date and Time:09/10/2013 19:52:00  eScription Dictation id: [id number] Confirmation # :[id number]

## 2013-09-10 NOTE — Progress Notes (Signed)
See dictation

## 2013-09-19 ENCOUNTER — Telehealth (HOSPITAL_BASED_OUTPATIENT_CLINIC_OR_DEPARTMENT_OTHER): Payer: Self-pay | Admitting: Internal Medicine

## 2013-09-19 NOTE — Progress Notes (Addendum)
.  Call to pt l/m to see if she could possibly change her appointment time to come in at 2:00 today instead of 3:40.

## 2013-09-24 ENCOUNTER — Ambulatory Visit (HOSPITAL_BASED_OUTPATIENT_CLINIC_OR_DEPARTMENT_OTHER): Payer: No Typology Code available for payment source | Admitting: Orthopaedic Surgery

## 2013-10-02 ENCOUNTER — Ambulatory Visit (HOSPITAL_BASED_OUTPATIENT_CLINIC_OR_DEPARTMENT_OTHER): Payer: No Typology Code available for payment source | Admitting: Urology

## 2013-10-02 ENCOUNTER — Encounter (HOSPITAL_BASED_OUTPATIENT_CLINIC_OR_DEPARTMENT_OTHER): Payer: Self-pay | Admitting: Urology

## 2013-10-02 DIAGNOSIS — N393 Stress incontinence (female) (male): Secondary | ICD-10-CM

## 2013-10-02 LAB — URINE DIP (POINT OF CARE)
BILIRUBIN, URINE: NEGATIVE
GLUCOSE, URINE: NEGATIVE mg/dl
KETONE, URINE: NEGATIVE mg/dl
LEUKOCYTE ESTERASE: NEGATIVE
NITRITE, URINE: NEGATIVE
OCCULT BLOOD, URINE: NEGATIVE
PH URINE: 5.5 (ref 5.0–8.0)
PROTEIN, URINE: NEGATIVE mg/dl (ref 0–15)
SPECIFIC GRAVITY URINE: 1.02 (ref 1.003–1.030)
UROBILINOGEN URINE: 0.2 mg/dl (ref 0.2–1.0)

## 2013-10-02 LAB — MEAS POST-VOIDING RESIDUAL URINE&/BLADDER CAP: Post Void Residual: 0 (ref 0–0)

## 2013-10-02 NOTE — Progress Notes (Signed)
CC: SUI    HPI: Pt is a 52 y/o F s/p bladder neck injection 06/2013 with Dr. Orest Dikes here for routine follow-up.    Had been on estrace vaginal cream for 1 month in the past, but didn't feel this helped.     TODAY:  PVR - 0  Urine Dip:  ( - )    PMH/PSH/Meds/ALL/SH/FH all reviewed and updated in Epic system    PHYSICAL EXAM:  General: Patient is well-developed, well-nourished, and in no apparent distress.   Head and Face: Face is symmetric  Neuro: Alert and oriented to time, place, and person.  Affect is appropriate.  Neck: trachea midline.    Lungs: No respiratory distress, no intercostal retractions, no use of accessory muscles.  Skin: No visible corporeal dermatitis or mycosis.   GU 5/14 by Dr. Orest Dikes:   Urethral meatus was prepped with antiseptic and a lubricated Q-tip was placed within the urethra.    The patient was asked to valsalva and cough.  Then a half speculum was placed in the vagina-  First to press down on the rectum and then to compress the bladder.  This is to inspect for a cystocele, enterocele, or rectocele.  Bimanual exam performed  Finally, a digital rectal exam was performed.    Q-tip  45 degrees  No cystocele, enterocele, rectocele  Good anal tone   No rectal masses      UDS 01/2013:  Full report can be found in the electroic chart however, briefly  First sensation-9  First desire-118  Full capacity-611 (voided)    Patient did leak with valsalva or coughing with a full bladder  No detrusor overactivity on filling  Detrusor Acitivity with micturation  Positive sphincteric dyssyergia  Normal bulbocavernosal reflex and UPP  Unobstructed uroflow parameters      AP:  Pt is a 52 y/o F s/p bladder neck injection 06/2013 with Dr. Orest Dikes

## 2013-10-02 NOTE — Progress Notes (Signed)
Patient presents with:    Incontinence - stress incontinence, 3 month f/u    S/p bladder neck injection  Patient states that post op she was fine but about 1 month after the surgery, she started to leak again.  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  I have spent more than 10/15 minutes in face to face counseling time with this patient and have answered all questions concerning the issues above.  The initial history and physical exam information was obtained by the PA-C, who also made a detailed record of this visit. I discussed management with the PA-C and performed a visit examination of the patient. I reviewed the PA-C's note and agree with the documented findings and plan of care. I also reviewed the patient's past medical history/problem list, past surgical history, medication list, social history and allergies and disposition decisions. Please see PA note below for additional information.

## 2013-10-31 ENCOUNTER — Encounter (HOSPITAL_BASED_OUTPATIENT_CLINIC_OR_DEPARTMENT_OTHER): Payer: Self-pay | Admitting: Internal Medicine

## 2013-10-31 ENCOUNTER — Ambulatory Visit (HOSPITAL_BASED_OUTPATIENT_CLINIC_OR_DEPARTMENT_OTHER): Payer: No Typology Code available for payment source | Admitting: Internal Medicine

## 2013-10-31 VITALS — BP 102/66 | HR 87 | Temp 98.0°F | Wt 146.0 lb

## 2013-10-31 DIAGNOSIS — F329 Major depressive disorder, single episode, unspecified: Secondary | ICD-10-CM

## 2013-10-31 DIAGNOSIS — M545 Low back pain, unspecified: Secondary | ICD-10-CM

## 2013-10-31 DIAGNOSIS — F3289 Other specified depressive episodes: Secondary | ICD-10-CM

## 2013-10-31 DIAGNOSIS — G8929 Other chronic pain: Secondary | ICD-10-CM

## 2013-10-31 DIAGNOSIS — M25372 Other instability, left ankle: Secondary | ICD-10-CM

## 2013-10-31 NOTE — Progress Notes (Signed)
Loretta Martin is a 53 year old female who presents >20 minutes late for 20 minute appointment.  Added in at the end of the session.  She has chronic back pain and depression.  Has seen ortho, but not having regular follow up.  Not regular about PT.  Massage therapy helping.  PT not so helpful.  General  pain at back and neck "not as bad"      Really worried about left foot  Left foot keeps causing her to fall.  Difficult to be sure, but it appears she often steps on left while it is inverted.  Describes as "foot keeps turning," Aware of falling.  No loss of consciousness    Saw ortho who reccommended brace and encouraged follow up with another orthopedist, but she did not understand why she would need a referral since she was seeing ortho.    ROS: No TIA's or unusual headaches, no dysphagia.  No prolonged cough. No dyspnea or chest pain on exertion.  No abdominal pain, change in bowel habits, black or bloody stools.  See PHQ-9    OBJECTIVE:  BP 102/66   Pulse 87   Temp(Src) 98 F (36.7 C) (Oral)   Wt 146 lb (66.225 kg)   BMI 25.05 kg/m2   SpO2 96%   LMP 11/02/2009  Alert lucid NAD  Bright affect  Normal gait and able to walk on heels and toes.  Unable to walk heel to toe on straight line without loss of balance, but able to stand in place with eye closed.  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  Needs to use brace and increase use.  Already was in PT and didn't keep up, so not clear the utility of sending back.  Encouraged to follow up with ortho as previously referresd    (311) Depressive disorder, not elsewhere classified  Comment: stable    (724.2,  338.29) Chronic low back pain  Comment: ongoing massage

## 2013-11-20 ENCOUNTER — Other Ambulatory Visit (HOSPITAL_BASED_OUTPATIENT_CLINIC_OR_DEPARTMENT_OTHER): Payer: Self-pay | Admitting: Internal Medicine

## 2013-11-21 NOTE — Progress Notes (Signed)
PER Pharmacy, Loretta Martin is a 53 year old female has requested a refill of vitamin d 400mg .      Last Office Visit: 10-31-13  Last Physical Exam: 10-23-12      Other Med Adult:  Most Recent BP Reading(s)  10/31/13 : 102/66      Cholesterol (mg/dl)   Date  Value    12/28/2010  207*   ----------  LDL DIRECT (mg/dl)   Date  Value    12/28/2010  130*   ----------  HDL (mg/dl)   Date  Value    12/28/2010  57    ----------  TRIGLYCERIDES (mg/dl)   Date  Value    08/10/2005  79    ----------      THYROID SCREEN TSH REFLEX FT4 (uIU/mL)   Date  Value    10/31/2008  2.83    ----------      TSH (THYROID STIM HORMONE) (uIU/ml)   Date  Value    08/10/2005  3.36    ----------    HEMOGLOBIN A1C (%)   Date  Value    10/23/2012  4.8    ----------      INR (no units)   Date  Value    05/12/2009  1.0*   ----------       Documented patient preferred pharmacies:    [facility] [place], [place]  Phone: [phone] Fax: [phone]

## 2013-11-28 ENCOUNTER — Telehealth (HOSPITAL_BASED_OUTPATIENT_CLINIC_OR_DEPARTMENT_OTHER): Payer: Self-pay | Admitting: Ambulatory Care

## 2013-11-28 NOTE — Progress Notes (Signed)
Left message for pt to call [facility]. Need to review her use of fluoxetine.

## 2013-12-04 NOTE — Progress Notes (Addendum)
Left message asking pt to call the nurse               We had received info from insurance co that she is not filling the fluoxetine

## 2013-12-05 NOTE — Progress Notes (Signed)
Did verify with pharmacy pt picked up the medication Oct, Nov, Dec 2014 and Nov 20 2013. Unable to reach pt

## 2013-12-10 ENCOUNTER — Ambulatory Visit (HOSPITAL_BASED_OUTPATIENT_CLINIC_OR_DEPARTMENT_OTHER): Payer: No Typology Code available for payment source | Admitting: Primary Podiatric Medicine

## 2013-12-10 ENCOUNTER — Encounter (HOSPITAL_BASED_OUTPATIENT_CLINIC_OR_DEPARTMENT_OTHER): Payer: Self-pay | Admitting: Primary Podiatric Medicine

## 2013-12-10 DIAGNOSIS — M25372 Other instability, left ankle: Secondary | ICD-10-CM

## 2013-12-10 DIAGNOSIS — B351 Tinea unguium: Secondary | ICD-10-CM

## 2013-12-10 DIAGNOSIS — M79609 Pain in unspecified limb: Secondary | ICD-10-CM

## 2013-12-10 NOTE — Progress Notes (Signed)
See dictation

## 2013-12-11 NOTE — Progress Notes (Signed)
Date of Service: 12/10/2013    SUBJECTIVE:  This very pleasant, 53 year old female patient, last seen on September 10, 2013, returns for followup evaluation of toenail fungus primarily at both great toes.  She does note completing the 43-month course of oral Lamisil, possibly 1 month ago, and has noticed gradual slow improvement in the discoloration at both great toenails.  She does ask about going through another 55-month course of the oral Lamisil.  She did have labs performed on September 10, 2013, specifically AST and ALT, and both were in normal range.  She does report completion of physical therapy for left ankle instability this past fall, still having some problems with weak left ankle and occasional pain.  Today, she presents wearing a pair of light-weight, minimally supportive Patent examiner shoes.  She does report being given home exercises by physical therapy, but not sure if she is doing them frequently at this point in time.    MEDICAL HISTORY REVIEW:  Depression, irritable bowel syndrome, chronic low back pain, stress incontinence, vitamin D deficiency, cervical radiculopathy, neck pain, carpal tunnel syndrome, left ankle instability.    CURRENT MEDICATIONS:  Vitamin D, Prozac, multivitamins, Lovaza, Flonase.    ALLERGIES:  HYDROCODONE, ACETAMINOPHEN, PENICILLIN.    REVIEW OF SYSTEMS:  The patient reports no recent fever, chills, nausea, vomiting, headache, breathing difficulty, chest pain, night sweats.  She reports no recent inversion ankle injuries in the past several months.    PHYSICAL EXAMINATION:    GENERAL:  The patient is alert and oriented, in no acute distress with ambulation.   DISTAL EXTREMITY EVALUATION:   She does have light yellow nail plate discoloration and minimal thickening at the distal 1/4 of both great toenail plates with the more proximal nail plate now revealing more normal pink translucent color and thickness.  No significant subungual debris present at the distal edges of  either great toenail plate.  Dorsalis pedis and posterior tibial pulses +2/4.  Skin temperature at both feet warm to touch from ankles to end of toes.  Web spaces remain clean and maceration free.    She has only very minimal discomfort with deep palpation at the anterolateral left ankle joint, primarily over the anterior talofibular ligament.  No significant pain with passive range of motion at the left ankle and subtalar joint with dorsiflexion, plantarflexion, inversion, or eversion.  Muscle strength testing at the left ankle does reveal weaker left foot eversion, dorsiflexion, and plantar flexion compared to the right side.  Inversion of both feet is slightly weak, but symmetrical.  The patient is able to perform bilateral toe stance and is also able to perform bilateral heels only stance.  Single leg stork stance does reveal some mild instability at the right ankle and subtalar joint and more significant instability with single leg stance on the left side.    IMAGING:  Radiographs taken of the left ankle on July 04, 2013 revealed no evidence of any osteochondral defect, no fracture or dislocation, and no evidence of any avulsion injury at the lateral ankle joint area.    PLAN:  Today, both hallux nail plates were manually debrided in an attempt to remove as much of the remaining fungal-like nail now having grown out more towards the distal aspect.  I did discuss with patient that with the oral Lamisil tablets completed 1 month ago, that it may be at least another 6 months before she sees the final result from the oral antifungal medication.  I did discuss with her  no likely benefit with continuing with another 51-month course of medication and such early repeat would not be covered by insurance anyway.  I have discussed with her that she should be utilizing a topical antifungal liquid spray or cream on and around all of her toe and nail areas at least once daily on a regular permanent basis to prevent any  reinfection of the nail areas from surface skin fungus.  I have discussed at length with patient need to perform strengthening exercises at home, including strengthening the peroneal muscles and evertors as well as need to perform proprioception exercise, specifically single leg stance exercise, standing on 1 foot for 1 minute and repeat several times daily.  She has used soft fabric ankle support sleeve, but has not found any benefit.  She may try a more supportive rigid sport-type ankle support brace, and I have recommended she look specifically at the Futuro rigid sport ankle support available at [facility] or other pharmacies.  I will see her back again in 3 months for followup.  I did discuss with her also looking for athletic-type foot wear that has wider, straighter last construction for more stable base of support.    ___________________________  Reviewed and Electronically Signed By: Eddie Dibbles Beata Beason DPM  Sig Date: 12/12/2013  Sig Time: 08:30:11  Dictated By: Eddie Dibbles Lekeisha Arenas DPM  Dict Date: 12/10/2013 Dict Time: 05 02 PM    Dictation Date and Time:12/10/2013 17:02:44  Transcription Date and Time:12/11/2013 09:25:40  eScription Dictation id: [id number] Confirmation # :[id number]

## 2013-12-24 ENCOUNTER — Encounter (HOSPITAL_BASED_OUTPATIENT_CLINIC_OR_DEPARTMENT_OTHER): Payer: Self-pay | Admitting: Internal Medicine

## 2013-12-24 ENCOUNTER — Ambulatory Visit (HOSPITAL_BASED_OUTPATIENT_CLINIC_OR_DEPARTMENT_OTHER): Payer: No Typology Code available for payment source | Admitting: Internal Medicine

## 2013-12-24 ENCOUNTER — Telehealth (HOSPITAL_BASED_OUTPATIENT_CLINIC_OR_DEPARTMENT_OTHER): Payer: Self-pay | Admitting: Ambulatory Care

## 2013-12-24 VITALS — BP 110/90 | HR 76 | Temp 97.9°F | Wt 148.0 lb

## 2013-12-24 DIAGNOSIS — R21 Rash and other nonspecific skin eruption: Secondary | ICD-10-CM

## 2013-12-24 NOTE — Progress Notes (Signed)
Called pt with tel interpreter [place]. Patient complains of "rash outside eye". States is small blisters. Clarified is both eyes, concerned and asking to be seen. Able to accommodate at 1020 with pcp.

## 2013-12-24 NOTE — Telephone Encounter (Signed)
Message copied by Shelbie Proctor on Mon Dec 24, 2013  9:36 AM  ------       Message from: Woody Seller       Created: Mon Dec 24, 2013  9:19 AM       Regarding: eye pain       Contact: [phone]         Loretta Martin is a 53 year old old female.              Patient's PCP: Ilda Foil, MD              In case we get disconnected what is the best number to reach you at today:       Home Phone [phone] (home)              Person calling:       Patient (self)               How can I help you today:        Other: patient is calling, having pain in her eyes. Would like to speak to RN.              Patient's language of care: [place]              Would you like an interpreter when the nurse calls you back?       YES  [place]         ------

## 2013-12-24 NOTE — Progress Notes (Signed)
Loretta Martin is a 53 year old female who developed sudden and unprovoked redness around eyes yesterday.  Son noticed at 1 PM.  Patient did not see much until looking into mirror that night when swell and redness was severe.  A little better today, particularly at the left eye.  Vision has been unchanged.  No itching or burning at the face.  Had normal "Rice and meat"  for lunch, and other than the ash feels normal.  No other skin lesions.  No joint swelling. No fevers or chills.    Has not changed any meds in months to years.  No sick contacts  No make up applied before onset, but did use some vaseline at face only.    No new clothes or detergents.    OBJECTIVE:  BP 110/90   Pulse 76   Temp(Src) 97.9 F (36.6 C)   Wt 148 lb (67.132 kg)   BMI 25.39 kg/m2   SpO2 99%   LMP 11/02/2009  Pleasant woman appears at physical baseline except for rash seen in photos.  No lesions at ears scalp or oral mucosa.  No lesions at neck or chest.  Normal hands, nails have had recent manicure and are covered.    (782.1) Facial rash  (primary encounter diagnosis)  Comment: rash appears to be burst blood vessels, but I cannot think of any trigger.  As she is not feeling any ill effects and rash seems to be improving will get telederm referral before making any suggestions

## 2013-12-26 ENCOUNTER — Telehealth (HOSPITAL_BASED_OUTPATIENT_CLINIC_OR_DEPARTMENT_OTHER): Payer: Self-pay | Admitting: Dermatology

## 2013-12-26 DIAGNOSIS — L538 Other specified erythematous conditions: Secondary | ICD-10-CM

## 2013-12-26 NOTE — Progress Notes (Addendum)
Thank you for referring your patient to me with use of the teledermatology system. I am basing my recommendations upon the information provided by the referring physician, including images, and upon review of the problem list, medications, and allergies as documented in the electronic medical record. I have not had the benefit of personally interviewing or physically examining the patient.    Consultation request:  acute periorbital rash    Image interpretation:  1) face- brightly erythematous macules on the upper eyelid and infraorbital cheeks b/l; mild edema of R upper eyelid noted  2) R lateral face- brightly erythematous macules, many petechial in appearance on R upper and lower eyelid and infraorbital cheek     Assessment:  Petechial periorbital eruption- unclear etiology. Given history of acute onset of swelling and erythema, as well as lack of symptoms of inflammation (pruritus) or irritation (burning) these findings could be consistent with post-edema petechiae (similar to that seen in stasis dermatitis). Perhaps she was unknowingly exposed to an airborne or oral allergen that caused a transient eyelid edema? I agree that this is an odd presentation. It is reassuring that the rash is reportedly improving. I would expect a petechial eruption to take longer to clear completely (several weeks) as opposed to a purely inflammatory eruption. If the rash persists/recurs, would also ask about muscle weakness, fatigue etc, and look for other stigmata of dermatomyositis (gottren's papules, shawl sign, etc) as this could also be consistent with heliotrope rash, though I would expect more inflammatory symptomatology.    Recommendations:  - Screen for signs/symptoms of dermatomyositis. If positive, recommend scheduling in person derm eval for clinical exam and potential biopsy.   - If negative, recommend gentle local skin care, avoiding any fragranced products or harsh soaps. Vaseline once or twice a day can be used if  area is dry.   - Given that area is asymptomatic, would hold off on any topical corticosteroids.  - if eyelid edema seems to be recurrent problem, consider allergy eval to r/o oral/respiratory allergies. Lack of pruritus, papularity make allergic contact dermatitis unlikely.    - Addendum- periorbital purpura can also be a presenting sign of systemic amyloidosis. While the lesions are not quite purpuric, given this atypical appearance it may be worthwhile checking SPEP/UPEP, Serum/urine protein immunoelectropheresis to rule this out.    Please let me know whether I can be of further assistance and please let me know how the patient responds to your management.  Sincerely yours,    Wilburn Mylar, M.D.

## 2013-12-27 ENCOUNTER — Telehealth (HOSPITAL_BASED_OUTPATIENT_CLINIC_OR_DEPARTMENT_OTHER): Payer: Self-pay | Admitting: Internal Medicine

## 2013-12-27 NOTE — Progress Notes (Signed)
Called to discuss opinion of derm.  Unable to get through.  Left a message that we should discuss the results tomorrow

## 2014-01-01 ENCOUNTER — Ambulatory Visit (HOSPITAL_BASED_OUTPATIENT_CLINIC_OR_DEPARTMENT_OTHER): Payer: No Typology Code available for payment source | Admitting: Urology

## 2014-01-01 NOTE — Progress Notes (Signed)
Again left a message.

## 2014-01-02 NOTE — Progress Notes (Signed)
Finally reached patient who states rash is gone and now she feels well.  Rash went away almost immediately after I saw her and she has had no recurrence and no other concerning sx.  I invited her to return or let me know if any swelling or rash returns.

## 2014-01-07 ENCOUNTER — Ambulatory Visit (HOSPITAL_BASED_OUTPATIENT_CLINIC_OR_DEPARTMENT_OTHER): Payer: No Typology Code available for payment source | Admitting: Internal Medicine

## 2014-01-07 ENCOUNTER — Encounter (HOSPITAL_BASED_OUTPATIENT_CLINIC_OR_DEPARTMENT_OTHER): Payer: Self-pay | Admitting: Internal Medicine

## 2014-01-07 VITALS — BP 100/80 | HR 94 | Temp 98.1°F | Ht 62.21 in | Wt 147.0 lb

## 2014-01-07 DIAGNOSIS — IMO0002 Reserved for concepts with insufficient information to code with codable children: Secondary | ICD-10-CM

## 2014-01-07 DIAGNOSIS — F329 Major depressive disorder, single episode, unspecified: Secondary | ICD-10-CM

## 2014-01-07 DIAGNOSIS — N393 Stress incontinence (female) (male): Secondary | ICD-10-CM

## 2014-01-07 DIAGNOSIS — F3289 Other specified depressive episodes: Secondary | ICD-10-CM

## 2014-01-07 DIAGNOSIS — Z0000 Encounter for general adult medical examination without abnormal findings: Secondary | ICD-10-CM

## 2014-01-07 MED ORDER — ESTRADIOL 0.75 MG/1.25 GM (0.06%) TD GEL
1.2500 g | TRANSDERMAL | Status: DC
Start: 2014-01-07 — End: 2014-01-21

## 2014-01-07 NOTE — Progress Notes (Signed)
SUBJECTIVE:   53 year old female for annual routine checkup.  I recently saw her for unclear facial rash, but as per her phone call it quickly resolved and has not returned.  Feels well with no visual changes .    We reviewed RHM including the rationale for mammo and cervical cancer screen and all the intervals for these.  Only concerns are that she continues to have urinary incontinence even after bladder neck injection and she has been having lack of lubrication and pain with sex for about 5 years.  Has been menopausal for about the same time.  No other vaginal sx.  No discharge    Mood is good.  See PHQ-9      Current Outpatient Prescriptions:  Cholecalciferol (VITAMIN D3) 400 UNITS tablet TAKE 1 TABLET BY MOUTH DAILY Disp: 30 tablet Rfl: 11   FLUoxetine (PROZAC) 20 MG capsule Take 2 capsules by mouth daily. Disp: 60 capsule Rfl: 11   Multiple Vitamin (MULTIVITAMIN) TABS Take 1 tablet by mouth daily. Disp:  Rfl:    omega-3 acid ethyl esters (LOVAZA) 1 GM capsule Take 2 g by mouth 2 (two) times daily. Disp:  Rfl:    fluticasone (FLONASE) 50 MCG/ACT nasal spray instill 2 sprays into each nostril once daily Disp: 48 g Rfl: 3   Estradiol (ESTROGEL) 0.75 MG/1.25 GM (0.06%) topical gel Place 1.25 g onto the skin every 3 (three) days. Disp: 50 g Rfl: 11   PROAIR HFA 108 (90 BASE) MCG/ACT inhaler Inhale 2 puffs into the lungs every 4 (four) hours as needed for Wheezing. Disp: 1 Inhaler Rfl: 11     No current facility-administered medications for this visit.  Allergies: Hydrocodone-Acetaminophen; Pcn   Patient's last menstrual period was 11/02/2009.    ROS:  Feeling well. No dyspnea or chest pain on exertion.  No abdominal pain, change in bowel habits, black or bloody stools.  No urinary tract symptoms. GYN ROS: no breast pain or new or enlarging lumps on self exam and amenorhea.    OBJECTIVE:   The patient appears well, in NAD.   BP 100/80   Pulse 94   Temp(Src) 98.1 F (36.7 C) (Oral)   Ht 5' 2.21" (1.58 m)    Wt 147 lb (66.679 kg)   BMI 26.71 kg/m2   SpO2 98%   LMP 11/02/2009  ENT normal.  Neck supple. No adenopathy or thyromegaly. PERLA. Clear, good air entry, no wheezes, rhonci or rales. S1 and S2 normal, no murmurs, regular rate and rhythm. No edema. Abdomen soft without tenderness, guarding, mass or organomegaly.    ASSESSMENT:   (V70.0) Routine general medical examination at a health care facility  (primary encounter diagnosis)  Comment: updated RHM    (311) Depressive disorder, not elsewhere classified  Comment: doing well on current meds.  continue    (625.6) Female stress incontinence  Comment: trial of topical estrogen both for dyspareunia and incontinence  Plan: Estradiol (ESTROGEL) 0.75 MG/1.25 GM (0.06%)         topical gel    (625.0) Dyspareunia  Comment: as above.  Educated re use of OTC lubricants and explained oil-based versus waer based  Plan: Estradiol (ESTROGEL) 0.75 MG/1.25 GM (0.06%)         topical gel

## 2014-01-15 ENCOUNTER — Ambulatory Visit (HOSPITAL_BASED_OUTPATIENT_CLINIC_OR_DEPARTMENT_OTHER): Payer: No Typology Code available for payment source | Admitting: Surgical

## 2014-01-21 ENCOUNTER — Telehealth (HOSPITAL_BASED_OUTPATIENT_CLINIC_OR_DEPARTMENT_OTHER): Payer: Self-pay

## 2014-01-21 DIAGNOSIS — IMO0002 Reserved for concepts with insufficient information to code with codable children: Secondary | ICD-10-CM

## 2014-01-21 DIAGNOSIS — N393 Stress incontinence (female) (male): Secondary | ICD-10-CM

## 2014-01-21 MED ORDER — ESTRADIOL 0.1 MG/GM VA CREA
2.0000 g | TOPICAL_CREAM | VAGINAL | Status: DC
Start: 2014-01-21 — End: 2014-07-01

## 2014-01-21 NOTE — Progress Notes (Signed)
Called pharmacy to verify that topical gel was erroneously ordered instead of vaginal one.  Called the patient to apologize.  (she had tried it and it burned

## 2014-01-21 NOTE — Progress Notes (Signed)
Gel prescribed for treatment of incontinence and dyspareuria  Will confirm with provider where to apply

## 2014-01-21 NOTE — Telephone Encounter (Signed)
Message copied by Elesa Hacker on Mon Jan 21, 2014 10:57 AM  ------       Message from: [name]       Created: Mon Jan 21, 2014 10:50 AM       Regarding: Question on med       Contact: [phone]         Loretta Martin is a 53 year old old female.              Patient's PCP: Ilda Foil, MD              In case we get disconnected what is the best number to reach you at today:       Home Phone [phone] (home)              Person calling:       Patient (self)              How can I help you today:        Patient has questions on how to use the medication prescribed by Dr. Nyoka Cowden, Estradiol Gel. Is requesting to speak with the nurse.              Patient's language of care: [place]              Would you like an interpreter when the nurse calls you back?       YES  [place]  ------

## 2014-01-29 ENCOUNTER — Ambulatory Visit (HOSPITAL_BASED_OUTPATIENT_CLINIC_OR_DEPARTMENT_OTHER): Payer: No Typology Code available for payment source | Admitting: Urology

## 2014-02-05 ENCOUNTER — Ambulatory Visit (HOSPITAL_BASED_OUTPATIENT_CLINIC_OR_DEPARTMENT_OTHER): Payer: No Typology Code available for payment source | Admitting: Urology

## 2014-03-18 ENCOUNTER — Ambulatory Visit (HOSPITAL_BASED_OUTPATIENT_CLINIC_OR_DEPARTMENT_OTHER): Payer: No Typology Code available for payment source | Admitting: Primary Podiatric Medicine

## 2014-03-19 ENCOUNTER — Ambulatory Visit (HOSPITAL_BASED_OUTPATIENT_CLINIC_OR_DEPARTMENT_OTHER): Payer: No Typology Code available for payment source | Admitting: Internal Medicine

## 2014-03-25 ENCOUNTER — Ambulatory Visit (HOSPITAL_BASED_OUTPATIENT_CLINIC_OR_DEPARTMENT_OTHER): Payer: No Typology Code available for payment source | Admitting: Internal Medicine

## 2014-03-25 VITALS — BP 110/80 | HR 80 | Temp 97.5°F | Wt 142.0 lb

## 2014-03-25 DIAGNOSIS — N760 Acute vaginitis: Secondary | ICD-10-CM

## 2014-03-25 DIAGNOSIS — F3289 Other specified depressive episodes: Secondary | ICD-10-CM

## 2014-03-25 DIAGNOSIS — R5383 Other fatigue: Secondary | ICD-10-CM

## 2014-03-25 DIAGNOSIS — N949 Unspecified condition associated with female genital organs and menstrual cycle: Secondary | ICD-10-CM

## 2014-03-25 DIAGNOSIS — F329 Major depressive disorder, single episode, unspecified: Secondary | ICD-10-CM

## 2014-03-25 DIAGNOSIS — K589 Irritable bowel syndrome without diarrhea: Secondary | ICD-10-CM

## 2014-03-25 DIAGNOSIS — R5381 Other malaise: Secondary | ICD-10-CM

## 2014-03-25 LAB — URINE DIP (POINT OF CARE)
BILIRUBIN, URINE: NEGATIVE
GLUCOSE, URINE: NEGATIVE mg/dl
KETONE, URINE: NEGATIVE mg/dl
LEUKOCYTE ESTERASE: NEGATIVE
NITRITE, URINE: NEGATIVE
OCCULT BLOOD, URINE: NEGATIVE
PH URINE: 6 (ref 5.0–8.0)
PROTEIN, URINE: NEGATIVE mg/dl (ref 0–15)
SPECIFIC GRAVITY URINE: 1.015 (ref 1.003–1.030)
UROBILINOGEN URINE: 0.2 mg/dl (ref 0.2–1.0)

## 2014-03-25 LAB — TSH (THYROID STIMULATING HORMONE): TSH (THYROID STIM HORMONE): 3.5 u[IU]/mL (ref 0.358–3.740)

## 2014-03-25 MED ORDER — FLUCONAZOLE 150 MG PO TABS
150.00 mg | ORAL_TABLET | Freq: Once | ORAL | Status: AC
Start: 2014-03-25 — End: 2014-03-25

## 2014-03-25 NOTE — Progress Notes (Signed)
SUBJECTIVE:  Loretta Martin is an 53 year old female who presents for evaluation of abdominal Bloating and vaginal discomfort.  Some days so bloated can't even put on clothes.  Sx have been since menopause 5 years.ago.  Bloating lasts for a few weeks at a time.  Frequent vaginal itching and vagina very dry.  Feels use of estrace makes the itching worse.  Urinates often.  When asked about sexual activity states she is married, but that it has been 2-3 months since having sex.  Pain with sex"Deep inside" despite use of lubricant  Normal bowel movements.  Mood is good, but she is tired and wants to sleep all the time for about a month  Sleeping OK.  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    Comment: treated [facility] 11/97 CSF VDRL neg 9/00 RPR 9/00 1:2 repeat RPR yearly     Trichomoniasis, unspecified 08/31/2004    Comment: 10/97    Nonspecific reaction to tuberculin skin test without active tuberculosis 08/31/2004    Comment: +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 - had negative CXR pp at [facility] referred to TB clinic    Nonspecific abnormal results of thyroid function study 08/31/2004    Comment: increased TSH 4.19 6/00; TSH nl 9/00; 05/04/99 - wnl    Urinary tract infection, site not specified 08/31/2004    Comment: 4/02, 6/02    Depressive disorder, not elsewhere classified 08/31/2004    Comment: 10/00 to Psych on Paxil, self d/c'd; 9/00 head CT: scattered punctate calcifications    Asthma     Presbyopia 04/23/2009    PVD (posterior vitreous detachment) 04/23/2009           Past Surgical History    LIG/TRNSXJ FLP TUBE ABDL/VAG APPR UNI/BI  1998    Comment Tubal Ligation    REDUCTION MAMMAPLASTY  2004    OB ANTEPARTUM CARE CESAREAN DLVR & POSTPARTUM      Comment c/section x 2    EXCISION EXCESSIVE SKIN & SUBQ TISSUE ABDOMEN      Comment abdominoplasty in  [place]    BREAST BIOPSY & NEEDLE LOC WIR      TRURL RF FEMALE BLADDER NECK STRS URIN INCONT           Current Outpatient Prescriptions on File Prior to Visit:  estradiol (ESTRACE) 0.1 MG/GM vaginal cream Place 2 g vaginally every other day. Disp: 1 tube Rfl: 11   [DISCONTINUED] Estradiol (ESTROGEL) 0.75 MG/1.25 GM (0.06%) topical gel Place 1.25 g onto the skin every 3 (three) days. Disp: 50 g Rfl: 11   Cholecalciferol (VITAMIN D3) 400 UNITS tablet TAKE 1 TABLET BY MOUTH DAILY Disp: 30 tablet Rfl: 11   FLUoxetine (PROZAC) 20 MG capsule Take 2 capsules by mouth daily. Disp: 60 capsule Rfl: 11   Multiple Vitamin (MULTIVITAMIN) TABS Take 1 tablet by mouth daily. Disp:  Rfl:    omega-3 acid ethyl esters (LOVAZA) 1 GM capsule Take 2 g by mouth 2 (two) times daily. Disp:  Rfl:    fluticasone (FLONASE) 50 MCG/ACT nasal spray instill 2 sprays into each nostril once daily Disp: 48 g Rfl: 3   PROAIR HFA 108 (90 BASE) MCG/ACT inhaler Inhale 2 puffs into the lungs every 4 (four) hours as needed for Wheezing. Disp: 1 Inhaler Rfl: 11  No current facility-administered medications on file prior to visit.    Review of Patient's Allergies indicates:   Hydrocodone-acetami*    Nausea and Vomiting   Pcn [penicillins]           Comment:Got a urine infection after using       Smoking status: Former Smoker     Quit date: 08/10/2004    Smokeless tobacco: Never Used    Comment: smoked only for 6 months.    Alcohol Use: Yes    Comment: sporadically       Review Of Systems  Respiratory: negative  Cardiovascular: negative  Gastrointestinal: negative  Genitourinary: negative    OBJECTIVE:  BP 110/80   Pulse 80   Temp(Src) 97.5 F (36.4 C) (Oral)   Wt 142 lb (64.411 kg)   SpO2 98%   LMP 11/02/2009  General appearance: healthy, alert, well developed, well nourished  Hydration: well hydrated  Ears: R TM - normal, L TM - normal  Nose: normal  Oropharynx: normal  Neck: normal  Lungs: normal  Heart: normal and regular rate and rhythm  Abdomen:  flat, normal bowel sounds.   Tenderness: none  Vagina and vulva are normal;  White discharge is noted.  Cervix normal without lesions. Uterus anteverted and mobile, normal in size and shape without tenderness.  Adnexa normal in size without masses or tenderness. PH 5.5.  Bland findings on wet mount    ASSESSMENT/PLAN:  (616.10) Vaginitis and vulvovaginitis, unspecified  (primary encounter diagnosis)  Comment: findings benign, but sx certainly c/w with fungal infection and wil trial med as sx persisting  Plan: URINE DIP (POINT OF CARE), fluconazole         (DIFLUCAN) 150 MG tablet    (625.9) Unspecified symptom associated with female genital  Comment: pain despite use of laxitives.  Refer to gyn  Plan: REFERRAL TO GYNECOLOGY (INT)    (311) Depressive disorder, not elsewhere classified  Comment: mood seems to be better, and exercising, but fatigue could be connected.  Will follow and check test below as she requests    (564.1) IBS (irritable bowel syndrome)  Comment: continue use of diet and exercise    (780.79) Malaise and fatigue  Plan: COLLECTION VENOUS BLOOD VENIPUNCTURE, TSH         (THYROID STIMULATING HORMONE)

## 2014-03-26 ENCOUNTER — Ambulatory Visit (HOSPITAL_BASED_OUTPATIENT_CLINIC_OR_DEPARTMENT_OTHER): Payer: No Typology Code available for payment source | Admitting: Obstetrics & Gynecology

## 2014-03-26 VITALS — BP 100/70 | Wt 140.6 lb

## 2014-03-26 DIAGNOSIS — N949 Unspecified condition associated with female genital organs and menstrual cycle: Secondary | ICD-10-CM

## 2014-03-26 MED ORDER — TRIAMCINOLONE ACETONIDE 0.1 % EX CREA
TOPICAL_CREAM | Freq: Every day | CUTANEOUS | Status: AC
Start: 2014-03-26 — End: 2015-03-27

## 2014-03-26 MED ORDER — ESTRADIOL 2 MG VA RING
2.0000 mg | VAGINAL_RING | VAGINAL | Status: DC
Start: 2014-03-26 — End: 2015-04-28

## 2014-03-26 NOTE — Progress Notes (Signed)
Pt here for pain and swelling for several years  Seen by me 2010  [place] normal 2013  Also c/o itching in vagina, dryness. Itching is os outside and inside  Pt has been using estrace for 2 months without improvement, using every other day  Menopausal  Gen: NAD  Pelvic: NAFG, pale labia minora  Spec: pale vaginal mucosa, normal appearing cervix and dc  Bimanual: normal size uterus, no masses  Ext normal bilaterally    A: + vaginal atrophy, + lichen simplex chronicus externally  P: d/w pt using estring for different route as no improvement internally  Add kenalog  F/u 8 weeks  15/20 min counseling visit  Frances Maywood

## 2014-03-31 ENCOUNTER — Encounter (HOSPITAL_BASED_OUTPATIENT_CLINIC_OR_DEPARTMENT_OTHER): Payer: Self-pay | Admitting: Internal Medicine

## 2014-05-28 ENCOUNTER — Ambulatory Visit (HOSPITAL_BASED_OUTPATIENT_CLINIC_OR_DEPARTMENT_OTHER): Payer: No Typology Code available for payment source | Admitting: Obstetrics & Gynecology

## 2014-05-29 ENCOUNTER — Ambulatory Visit (HOSPITAL_BASED_OUTPATIENT_CLINIC_OR_DEPARTMENT_OTHER): Payer: No Typology Code available for payment source | Admitting: Obstetrics & Gynecology

## 2014-06-04 ENCOUNTER — Ambulatory Visit (HOSPITAL_BASED_OUTPATIENT_CLINIC_OR_DEPARTMENT_OTHER): Payer: Self-pay | Admitting: Internal Medicine

## 2014-06-05 ENCOUNTER — Ambulatory Visit (HOSPITAL_BASED_OUTPATIENT_CLINIC_OR_DEPARTMENT_OTHER): Payer: No Typology Code available for payment source | Admitting: Obstetrics & Gynecology

## 2014-06-24 ENCOUNTER — Encounter (HOSPITAL_BASED_OUTPATIENT_CLINIC_OR_DEPARTMENT_OTHER): Payer: Self-pay | Admitting: Internal Medicine

## 2014-06-24 ENCOUNTER — Ambulatory Visit (HOSPITAL_BASED_OUTPATIENT_CLINIC_OR_DEPARTMENT_OTHER): Payer: No Typology Code available for payment source | Admitting: Internal Medicine

## 2014-06-24 VITALS — BP 104/66 | HR 76 | Temp 98.1°F | Wt 143.0 lb

## 2014-06-24 DIAGNOSIS — M542 Cervicalgia: Secondary | ICD-10-CM

## 2014-06-24 DIAGNOSIS — M5412 Radiculopathy, cervical region: Secondary | ICD-10-CM

## 2014-06-24 DIAGNOSIS — G8929 Other chronic pain: Secondary | ICD-10-CM

## 2014-06-24 DIAGNOSIS — M545 Low back pain, unspecified: Secondary | ICD-10-CM

## 2014-06-24 DIAGNOSIS — Z23 Encounter for immunization: Secondary | ICD-10-CM

## 2014-06-24 DIAGNOSIS — F329 Major depressive disorder, single episode, unspecified: Secondary | ICD-10-CM

## 2014-06-24 DIAGNOSIS — R519 Headache, unspecified: Secondary | ICD-10-CM

## 2014-06-24 DIAGNOSIS — F3289 Other specified depressive episodes: Secondary | ICD-10-CM

## 2014-06-24 DIAGNOSIS — N393 Stress incontinence (female) (male): Secondary | ICD-10-CM

## 2014-06-24 DIAGNOSIS — R51 Headache: Secondary | ICD-10-CM

## 2014-06-24 MED ORDER — PROPRANOLOL HCL 10 MG PO TABS
10.0000 mg | ORAL_TABLET | Freq: Two times a day (BID) | ORAL | Status: DC
Start: 2014-06-24 — End: 2014-09-10

## 2014-06-24 NOTE — Progress Notes (Signed)
Loretta Martin is a 53 year old female who presents (late) for follow up of depressed mood, Fatigue, and sx of IBS.  She was supposed to have multiple gyne visits which were reschseduled and she now believes the Nuva ring in stuck and she cannot get it out on her own  She states today that she is worried about pain at the left side of her neck.  She has had chronic pain there before, but now she states she feels the "vein at neck is shrinking" and pulling left side.  Has been currently present for more than 2 months, particularly when turning head to the right. Some numbness and cramps at both fingers which is constant for her  Frequent episodes of nausea which feel "the same as being pregnant."  Saturday, had 30 minutes of tremor, agitation, and anxiety.  Went to lie down and felt better.  Nausea persisted  Sometimes ear pain and headache.  Headache is almost constant  Feels pulsing.  Mood generally good  Willing to get flu shot.      OBJECTIVE:  BP 104/66   Pulse 76   Temp(Src) 98.1 F (36.7 C) (Oral)   Wt 64.864 kg (143 lb)   LMP 11/02/2009  Alert lucid NAD  NCAT.  Well groomed.  Full range of affect  See PHQ-9  The neck is supple and free of adenopathy or masses, the thyroid is normal without enlargement or nodules.  Pain with traction of left trapezius  Normal arm and grip strength and light touch sensation at hands    (723.1) Neck pain on left side  (primary encounter diagnosis)  Comment: chronic issue.  No acute trigger, but should return to PT   Plan: REFERRAL TO PHYSIATRY ( INT)    (724.2,  338.29) Chronic low back pain  Plan: REFERRAL TO PHYSIATRY ( INT)    (723.4) Cervical radiculopathy  Plan: REFERRAL TO PHYSIATRY ( INT)    (784.0) Headache disorder  Comment: I do think neck pain and anxiety are componants of pain and she may have tnsion or migraine.  Will trial propanolol to blunt anxiety and possibly palpitations with less impact on BP  Plan: propranolol (INDERAL) 10 MG tablet    (V04.81) Need  for prophylactic vaccination and inoculation against influenza  Plan: IMMUNIZATION ADMIN SINGLE, RN, PR INFLUENZA         VACCINE QUADRIVALENT 3 YRS PLUS IM (PRIVATE)    (311) Depressive disorder, not elsewhere classified  Comment: doing better.  Willing to risk use of beta blockade    (625.6) Female stress incontinence  Comment: ongoing gyne issues.  Not sure if Nuva ring fell out or is stuck but as she was late I have limited time to address  Plan: will notify women's health

## 2014-06-24 NOTE — Progress Notes (Signed)
Called to [facility]. They will call pt to assist with Estring removal

## 2014-06-26 ENCOUNTER — Ambulatory Visit (HOSPITAL_BASED_OUTPATIENT_CLINIC_OR_DEPARTMENT_OTHER): Payer: No Typology Code available for payment source | Admitting: Internal Medicine

## 2014-07-01 ENCOUNTER — Ambulatory Visit (HOSPITAL_BASED_OUTPATIENT_CLINIC_OR_DEPARTMENT_OTHER): Payer: No Typology Code available for payment source | Admitting: Obstetrics & Gynecology

## 2014-07-01 VITALS — BP 88/60 | Wt 144.0 lb

## 2014-07-01 DIAGNOSIS — T192XXA Foreign body in vulva and vagina, initial encounter: Secondary | ICD-10-CM

## 2014-07-01 NOTE — Patient Instructions (Signed)
SB 3 months for Estring removal

## 2014-07-01 NOTE — Progress Notes (Signed)
S:  53 Yo woman here for difficulty removing her Estring from her vagina. It is stuck in back of vagina.    This is her first Estring, placed it 6/20 and was supposed to remove 3 months later.    Has been getting good relief of vag dryness with this method.  Reports some low abd pain but says it is better than when she saw Dr Annamary Rummage 3 months ago.    Saw PCP and got med for pain in neck and back but is confused because she is aware that this med is used as a BP med: Propranolol    O: appears well  Abd soft NT  Vag pink moist  estring is in posterior vagina    A: Estring in post vagina, hard for pt to reach  Confusion about new med  P: Removed the ring easily  Reviewed info in last visit about her new med which is actually for her HA and anxiety, not for the neck and back pain. She understands.  Pt has another ring which she will place. If has difficulty removing when it is due for removal I am happy to assist again. Pt wants to schedule this visit just in case--Explained that squatting when trying to remove might help.    Cancel GYN follow up with Dr Annamary Rummage. Pt likes method and will stay on it.

## 2014-07-08 ENCOUNTER — Ambulatory Visit (HOSPITAL_BASED_OUTPATIENT_CLINIC_OR_DEPARTMENT_OTHER): Payer: No Typology Code available for payment source | Admitting: Physical Medicine & Rehabilitation

## 2014-07-08 DIAGNOSIS — S43402A Unspecified sprain of left shoulder joint, initial encounter: Secondary | ICD-10-CM

## 2014-07-08 DIAGNOSIS — M25512 Pain in left shoulder: Secondary | ICD-10-CM

## 2014-07-08 DIAGNOSIS — M7552 Bursitis of left shoulder: Secondary | ICD-10-CM

## 2014-07-08 MED ORDER — DICLOFENAC SODIUM 75 MG PO TBEC
75.0000 mg | DELAYED_RELEASE_TABLET | Freq: Two times a day (BID) | ORAL | Status: DC
Start: 2014-07-08 — End: 2015-07-30

## 2014-07-08 MED ORDER — BUPIVACAINE HCL 0.5 % IJ SOLN
5.00 mL | Freq: Once | INTRAMUSCULAR | Status: AC
Start: 2014-07-08 — End: 2014-07-08

## 2014-07-08 MED ORDER — METHYLPREDNISOLONE ACETATE 40 MG/ML IJ SUSP
40.00 mg | Freq: Once | INTRAMUSCULAR | Status: AC
Start: 2014-07-08 — End: 2014-07-08

## 2014-07-08 NOTE — Progress Notes (Signed)
Date of Service: 07/08/2014    This is a followup visit familiar to me patient, complaining of left posterior shoulder and neck pain, persistent for 2-3 months.      I evaluated the patient in December 2014 for left ankle pain; she was followed by podiatry, and today reports no pain in the left leg/ankle, but instead left posterior shoulder pain.    While in the room, she reports pain as a 9/10, pointing over posterior/shoulder periscapular area, upper trapezius.    She reports pain extending to the lower neck and upper arm, more confined to the posterior periscapular area.      She tried massage, heat application, with some benefit.    The patient continued to clean houses.    On examination, patient appeared not in obvious pain or distress, graded pain as a 9/10, despite detailed pain grading explanation.      Range of motion in all upper and lower limb joints not restricted, including glenohumeral joint and neck.    Spurling and Lhermitte's signs are negative.    Impingement signs are negative.    Speed, Yergason tests are negative.    Neurologically, there are no focal signs; I do not appreciate symptoms of radiculopathy.  On palpation, there is tenderness over upper trapezius, insertion of the levator scapular muscle, tenderness along the upper medial scapular border.    The presentation is more consistent with left shoulder periscapular pain/enthesopathy, trapezius sprain.  Although patient requested x-ray, I believe it is not indicated.    I infiltrated 40 mg of Depo-Medrol and 5 mL 0.25% bupivacaine over the insertion of the suprascapular/upper medial scapular border, along the maximal tenderness, prescribed patient diclofenac, discussed ergonomic issues, recommended to take it easy at work, encouraged to keep cold and later use muscle pain reliever cream.    I will see the patient as needed.    I spent with the patient 25 minutes, more than 50% of time on counseling.    ___________________________  Reviewed  and Electronically Signed By: Quintella Reichert MD  Sig Date: 07/09/2014  Sig Time: 21:54:38  Dictated By: Quintella Reichert MD  Dict Date: 07/08/2014 Dict Time: 04 56 PM    Dictation Date and Time:07/08/2014 16:56:17  Transcription Date and Time:07/08/2014 19:28:10  eScription Dictation id: [id number] Confirmation # :[id number]

## 2014-07-08 NOTE — Progress Notes (Signed)
The primary progress note for this visit has been dictated through E-Scription. It can be viewed as an attachment to this encounter or through Chart Review under the Notes Tab as an Orthopedic Office Note.      Review of Systems: Constitutional, Eyes, ENT/Mouth, Cardiovascular, Respiratory, GI, GU, Neuro, Psych, Heme/Lymph, Skin, Musculoskeletal was reviewed and is NEGATIVE except for what is dictated in the note.    MT ACCT #:  [id number]

## 2014-07-09 ENCOUNTER — Ambulatory Visit (HOSPITAL_BASED_OUTPATIENT_CLINIC_OR_DEPARTMENT_OTHER): Payer: No Typology Code available for payment source | Admitting: Obstetrics & Gynecology

## 2014-08-26 ENCOUNTER — Ambulatory Visit (HOSPITAL_BASED_OUTPATIENT_CLINIC_OR_DEPARTMENT_OTHER): Payer: No Typology Code available for payment source | Admitting: Internal Medicine

## 2014-09-10 ENCOUNTER — Other Ambulatory Visit (HOSPITAL_BASED_OUTPATIENT_CLINIC_OR_DEPARTMENT_OTHER): Payer: Self-pay | Admitting: Internal Medicine

## 2014-09-10 DIAGNOSIS — R519 Headache, unspecified: Secondary | ICD-10-CM

## 2014-09-10 DIAGNOSIS — R51 Headache: Principal | ICD-10-CM

## 2014-09-10 MED ORDER — PROPRANOLOL HCL 10 MG PO TABS
10.0000 mg | ORAL_TABLET | Freq: Two times a day (BID) | ORAL | Status: DC
Start: 2014-09-10 — End: 2015-01-27

## 2014-09-10 NOTE — Progress Notes (Signed)
PER Pharmacy,   Loretta Martin is a 53 year old female who has requested a refill of:    Propranolol 10mg   This medication has been reordered with the same quantity and end date as the last time it was prescribed.       Last Office Visit: 06/24/14  Last Physical Exam: n/a      Other Med Adult:  Most Recent BP Reading(s)  07/01/14 : 88/60        CHOLESTEROL (mg/dl)   Date Value   12/28/2010 207*   ----------    LOW DENSITY LIPOPROTEIN DIRECT (mg/dl)   Date Value   12/28/2010 130*   ----------    HIGH DENSITY LIPOPROTEIN (mg/dl)   Date Value   12/28/2010 57   ----------    TRIGLYCERIDES (mg/dl)   Date Value   08/10/2005 79   ----------        THYROID SCREEN TSH REFLEX FT4 (uIU/mL)   Date Value   10/31/2008 2.83   ----------        TSH (THYROID STIM HORMONE) (uIU/mL)   Date Value   03/25/2014 3.500   ----------      HEMOGLOBIN A1C (%)   Date Value   10/23/2012 4.8   ----------        INR (no units)   Date Value   05/12/2009 1.0*   ----------       Documented patient preferred pharmacies:    [facility] - [place], [place]  Phone: [phone] Fax: [phone]

## 2014-09-10 NOTE — Progress Notes (Signed)
.  [facility] INTERNAL MED    Person calling on behalf of patient: Pharmacy    May list multiple medications in this section    Medicine Name: propranalol        Documented patient preferred pharmacies:   [facility] - [place], [place]  Phone: [phone] Fax: [phone]

## 2014-09-16 ENCOUNTER — Ambulatory Visit (HOSPITAL_BASED_OUTPATIENT_CLINIC_OR_DEPARTMENT_OTHER): Payer: No Typology Code available for payment source | Admitting: Internal Medicine

## 2014-09-16 ENCOUNTER — Other Ambulatory Visit (HOSPITAL_BASED_OUTPATIENT_CLINIC_OR_DEPARTMENT_OTHER): Payer: Self-pay | Admitting: Internal Medicine

## 2014-09-16 MED ORDER — FLUOXETINE HCL 20 MG PO CAPS
40.0000 mg | ORAL_CAPSULE | Freq: Every day | ORAL | Status: DC
Start: 2014-09-16 — End: 2015-12-01

## 2014-09-16 NOTE — Progress Notes (Signed)
.  [facility] INTERNAL MED    Person calling on behalf of patient: Pharmacy    May list multiple medications in this section    Medicine Name: FLUOXETINE        Documented patient preferred pharmacies:   [facility] - [place], [place]  Phone: [phone] Fax: [phone]

## 2014-09-16 NOTE — Progress Notes (Signed)
PER Pharmacy, Loretta Martin is a 53 year old female has requested a refill of FLUOXETINE 20MG  CAPSULE.  PLEASE REVIEW, PATIENT NEEDS 90 DAYS SUPPLY TO BILL NEIGHBORHOOD.      Last Office Visit: 06/24/2014  Last Physical Exam: 01/07/2014      Other Med Adult:  Most Recent BP Reading(s)  07/01/14 : 88/60        CHOLESTEROL (mg/dl)   Date Value   12/28/2010 207*   ----------    LOW DENSITY LIPOPROTEIN DIRECT (mg/dl)   Date Value   12/28/2010 130*   ----------    HIGH DENSITY LIPOPROTEIN (mg/dl)   Date Value   12/28/2010 57   ----------    TRIGLYCERIDES (mg/dl)   Date Value   08/10/2005 79   ----------        THYROID SCREEN TSH REFLEX FT4 (uIU/mL)   Date Value   10/31/2008 2.83   ----------        TSH (THYROID STIM HORMONE) (uIU/mL)   Date Value   03/25/2014 3.500   ----------      HEMOGLOBIN A1C (%)   Date Value   10/23/2012 4.8   ----------        INR (no units)   Date Value   05/12/2009 1.0*   ----------       Documented patient preferred pharmacies:    [facility] - [place], [place]  Phone: [phone] Fax: [phone]

## 2014-09-23 ENCOUNTER — Encounter (HOSPITAL_BASED_OUTPATIENT_CLINIC_OR_DEPARTMENT_OTHER): Payer: Self-pay | Admitting: Internal Medicine

## 2014-09-23 ENCOUNTER — Ambulatory Visit (HOSPITAL_BASED_OUTPATIENT_CLINIC_OR_DEPARTMENT_OTHER): Payer: No Typology Code available for payment source | Admitting: Internal Medicine

## 2014-09-23 VITALS — BP 94/60 | HR 96 | Temp 97.6°F | Wt 145.0 lb

## 2014-09-23 DIAGNOSIS — F3340 Major depressive disorder, recurrent, in remission, unspecified: Secondary | ICD-10-CM

## 2014-09-23 DIAGNOSIS — M545 Low back pain, unspecified: Secondary | ICD-10-CM

## 2014-09-23 DIAGNOSIS — G8929 Other chronic pain: Secondary | ICD-10-CM

## 2014-09-23 DIAGNOSIS — M5412 Radiculopathy, cervical region: Secondary | ICD-10-CM

## 2014-09-23 DIAGNOSIS — M542 Cervicalgia: Secondary | ICD-10-CM

## 2014-09-23 NOTE — Progress Notes (Signed)
Loretta Martin is a 53 year old female who presents more than 30 minutes late for her appointment for follow-up of depression and neck pain.  Overall she states she is feeling better.  She has less anxiety less headaches and less dizziness.  She does not note any side effects from taking the beta blocker.  Neck pain is still present at the left side but getting a lot better with regular massage.  She does want a referral to a local acupuncturist.    No fevers no chills no nausea no vomiting.    She does have ongoing vaginal itching.  She has been seeing the gynecology team for this.  Next appointment is scheduled for within 2 weeks.  Feels when she uses the vaginal cream that the itching improves.  See PHQ-9    OBJECTIVE:  BP 94/60 mmHg   Pulse 96   Temp(Src) 97.6 F (36.4 C) (Oral)   Wt 65.772 kg (145 lb)   SpO2 97%   LMP 11/02/2009  [name] appears well, alert and oriented x 3, pleasant and cooperative.  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  Anxiety seems better controlled   Plan: Continue current medicines

## 2014-09-30 ENCOUNTER — Ambulatory Visit (HOSPITAL_BASED_OUTPATIENT_CLINIC_OR_DEPARTMENT_OTHER): Payer: No Typology Code available for payment source | Admitting: Obstetrics & Gynecology

## 2014-10-07 ENCOUNTER — Ambulatory Visit (HOSPITAL_BASED_OUTPATIENT_CLINIC_OR_DEPARTMENT_OTHER): Payer: No Typology Code available for payment source | Admitting: Obstetrics & Gynecology

## 2014-10-07 VITALS — BP 96/70 | Wt 143.0 lb

## 2014-10-07 DIAGNOSIS — T192XXA Foreign body in vulva and vagina, initial encounter: Secondary | ICD-10-CM

## 2014-10-07 NOTE — Progress Notes (Signed)
S: 54 YO woman here for assistance with removal of her Estring.  She attempted to remove it and is not able to do it. She is able to place her own ring.    The ring is really helping her menopausal sx, vag dryness and itching. Sh also uses Kenalog cream occasionally for itching externally.    O: appears well  Vag ext WNL  Int pink moist, no lesions  Thick ring intact  A: Estring in place, pt needs to have it removed as she is not able to do this, needs to change as it has been 3 months.    P:Estring removed  Pt will place new ring later today.  RTC 3 months if unable to remove next ring.

## 2014-10-07 NOTE — Patient Instructions (Signed)
SB for estrogen ring removal 3 months

## 2014-10-26 ENCOUNTER — Other Ambulatory Visit (HOSPITAL_BASED_OUTPATIENT_CLINIC_OR_DEPARTMENT_OTHER): Payer: Self-pay | Admitting: Internal Medicine

## 2014-10-26 NOTE — Progress Notes (Signed)
Person calling on behalf of patient: Pharmacy    Loretta Martin is a 54 year old female who is a patient requesting refills for medication(s)      - medication(s) request: ProAir inhaler  - last office visit: 10/07/2014  - last physical exam: 01/07/2014      Other Med Adult:  Most Recent BP Reading(s)  10/07/14 : 96/70        CHOLESTEROL (mg/dl)   Date Value   12/28/2010 207*   ----------    LOW DENSITY LIPOPROTEIN DIRECT (mg/dl)   Date Value   12/28/2010 130*   ----------    HIGH DENSITY LIPOPROTEIN (mg/dl)   Date Value   12/28/2010 57   ----------    TRIGLYCERIDES (mg/dl)   Date Value   08/10/2005 79   ----------        THYROID SCREEN TSH REFLEX FT4 (uIU/mL)   Date Value   10/31/2008 2.83   ----------        TSH (THYROID STIM HORMONE) (uIU/mL)   Date Value   03/25/2014 3.500   ----------      HEMOGLOBIN A1C (%)   Date Value   10/23/2012 4.8   ----------        INR (no units)   Date Value   05/12/2009 1.0*   ----------           Documented patient preferred pharmacies:    [facility] - [place], [place]  Phone: [phone] Fax: [phone]

## 2014-12-03 ENCOUNTER — Telehealth (HOSPITAL_BASED_OUTPATIENT_CLINIC_OR_DEPARTMENT_OTHER): Payer: Self-pay

## 2014-12-03 NOTE — Progress Notes (Signed)
[facility] Outreach for Pap/Pe. Pt agreed to schedule Pap for 01/27/15 at 8 am and PE at 11 am.

## 2014-12-09 ENCOUNTER — Other Ambulatory Visit (HOSPITAL_BASED_OUTPATIENT_CLINIC_OR_DEPARTMENT_OTHER): Payer: Self-pay | Admitting: Internal Medicine

## 2014-12-09 NOTE — Progress Notes (Signed)
PER Pharmacy, Loretta Martin is a 54 year old female has requested a refill of Vitamin D3 400 units      Last Office Visit: 09/23/14  Last Physical Exam: 01/07/14      Other Med Adult:  Most Recent BP Reading(s)  10/07/14 : 96/70        CHOLESTEROL (mg/dl)   Date Value   12/28/2010 207*   ----------    LOW DENSITY LIPOPROTEIN DIRECT (mg/dl)   Date Value   12/28/2010 130*   ----------    HIGH DENSITY LIPOPROTEIN (mg/dl)   Date Value   12/28/2010 57   ----------    TRIGLYCERIDES (mg/dl)   Date Value   08/10/2005 79   ----------        THYROID SCREEN TSH REFLEX FT4 (uIU/mL)   Date Value   10/31/2008 2.83   ----------        TSH (THYROID STIM HORMONE) (uIU/mL)   Date Value   03/25/2014 3.500   ----------      HEMOGLOBIN A1C (%)   Date Value   10/23/2012 4.8   ----------        INR (no units)   Date Value   05/12/2009 1.0*   ----------       Documented patient preferred pharmacies:    [facility] - [place], [place]  Phone: [phone] Fax: [phone]

## 2015-01-15 ENCOUNTER — Ambulatory Visit (HOSPITAL_BASED_OUTPATIENT_CLINIC_OR_DEPARTMENT_OTHER): Payer: No Typology Code available for payment source | Admitting: Obstetrics & Gynecology

## 2015-01-27 ENCOUNTER — Ambulatory Visit (HOSPITAL_BASED_OUTPATIENT_CLINIC_OR_DEPARTMENT_OTHER): Payer: No Typology Code available for payment source | Admitting: Obstetrics & Gynecology

## 2015-01-27 ENCOUNTER — Encounter (HOSPITAL_BASED_OUTPATIENT_CLINIC_OR_DEPARTMENT_OTHER): Payer: Self-pay | Admitting: Internal Medicine

## 2015-01-27 ENCOUNTER — Ambulatory Visit (HOSPITAL_BASED_OUTPATIENT_CLINIC_OR_DEPARTMENT_OTHER): Payer: No Typology Code available for payment source | Admitting: Internal Medicine

## 2015-01-27 VITALS — BP 90/70 | Ht 62.0 in | Wt 142.0 lb

## 2015-01-27 VITALS — BP 100/62 | HR 76 | Temp 98.0°F | Wt 144.0 lb

## 2015-01-27 DIAGNOSIS — M545 Low back pain, unspecified: Secondary | ICD-10-CM

## 2015-01-27 DIAGNOSIS — R140 Abdominal distension (gaseous): Secondary | ICD-10-CM

## 2015-01-27 DIAGNOSIS — R519 Headache, unspecified: Secondary | ICD-10-CM

## 2015-01-27 DIAGNOSIS — M5412 Radiculopathy, cervical region: Secondary | ICD-10-CM

## 2015-01-27 DIAGNOSIS — F3340 Major depressive disorder, recurrent, in remission, unspecified: Secondary | ICD-10-CM

## 2015-01-27 DIAGNOSIS — J302 Other seasonal allergic rhinitis: Secondary | ICD-10-CM

## 2015-01-27 DIAGNOSIS — Z833 Family history of diabetes mellitus: Secondary | ICD-10-CM

## 2015-01-27 DIAGNOSIS — T192XXA Foreign body in vulva and vagina, initial encounter: Secondary | ICD-10-CM

## 2015-01-27 DIAGNOSIS — G8929 Other chronic pain: Secondary | ICD-10-CM

## 2015-01-27 DIAGNOSIS — Z1239 Encounter for other screening for malignant neoplasm of breast: Secondary | ICD-10-CM

## 2015-01-27 DIAGNOSIS — E559 Vitamin D deficiency, unspecified: Secondary | ICD-10-CM

## 2015-01-27 DIAGNOSIS — R51 Headache: Secondary | ICD-10-CM

## 2015-01-27 DIAGNOSIS — K641 Second degree hemorrhoids: Secondary | ICD-10-CM

## 2015-01-27 DIAGNOSIS — K589 Irritable bowel syndrome without diarrhea: Secondary | ICD-10-CM

## 2015-01-27 MED ORDER — FLUTICASONE PROPIONATE 50 MCG/ACT NA SUSP
2.0000 | Freq: Every day | NASAL | Status: DC
Start: 2015-01-27 — End: 2016-05-26

## 2015-01-27 MED ORDER — PROPRANOLOL HCL 10 MG PO TABS
10.0000 mg | ORAL_TABLET | Freq: Two times a day (BID) | ORAL | Status: AC
Start: 2015-01-27 — End: 2016-01-27

## 2015-01-27 MED ORDER — PROAIR HFA 108 (90 BASE) MCG/ACT IN AERS
1.0000 | INHALATION_SPRAY | RESPIRATORY_TRACT | Status: DC | PRN
Start: 2015-01-27 — End: 2016-05-26

## 2015-01-27 NOTE — Progress Notes (Signed)
S: 54 YO woman here for pap and assistance with her Estring which she is unable to remove at home.    She also reports bloating recently for several months and urinary frequency but no dysuria.    No bowel changes    Not scheduled for full GYN PE today  Mammo 2 years ago  Plans to go to [place] July 1    O: appears well  Abd soft NT  Vag pink moist Ext WNL  Ring in place at post vagina  Cervix no lesions no CMT  Ut small NT ad NT no mass appreciated    A: New abd sx  Estring removal  Due for pap  Needs rest of GYN exam/hx, not sufficient time sched today    P: pap and HPV sent  Estring removed. Pt has Rx for new ring and will fill that.  Mammo ordered, pelvic u/s  Sched to finish Finish PE: breast, upper body.

## 2015-01-27 NOTE — Progress Notes (Signed)
Loretta Martin is a 54 year old female who presents with a chief concern of swelling in her abdomen and hemorrhoids.  States the neck and shoulder pain are better.  Despite the note I have from [facility] but states she is not feeling her Prozac she insisted she is taking it regularly.  She recently saw the gynecologist because of abdominal pain and is booked to get a pelvic ultrasound within the next few weeks.  She feels she is bloating and gaining weight for about a month.  The swelling at the belly comes and goes and does not feel connected to bowel movements or gas.  She does not feel improvement when passing gas.  Her stools are normal.  Minimal pain at the anus.  Small amounts of blood occasionally on the toilet paper which is bright red.  Otherwise stools are normal.  She used an over-the-counter cream which helped a little    She requests a refill on the propranolol which she has not been taking regularly.  We reviewed her family history of diabetes and it is been 2 years since we have checked.  She does need a refill on her Flonase and pro-air    I showed her the PHQ-9 which is increased to 13.  She again denies that there is any problem.  She does not feel that she is doing worse than before, but does note having some difficulty sleeping in the last few weeks which she thinks accounts for the higher score.    OBJECTIVE:  BP 100/62 mmHg   Pulse 76   Temp(Src) 98 F (36.7 C) (Oral)   Wt 65.318 kg (144 lb)   SpO2 96%   LMP 11/02/2009  Loretta Martin appears well, alert and oriented x 3, pleasant and cooperative.  Logical lucid and well groomed  ENT normal, neck supple and free of adenopathy.  Chest is clear to auscultation.  Heart sounds are normal, no murmurs, clicks, gallops or rubs.  Abdomen is soft, no masses or organomegaly.  Mild left lower quadrant tenderness.  There are 2 external hemorrhoid tags but no inflamed hemorrhoids at this time.  In no fissures    (K58.9) Irritable bowel  syndrome without diarrhea  (primary encounter diagnosis)  Comment: Her abdominal bloating could be contacted to many things including not taking her fluoxetine, her chronic irritable bowel, or something As concerning as ovarian cancer.  She is getting gynecologic evaluation and has an ultrasound booked.  Since she reports taking SSRIs there is not much I can do at this point to alter her use.    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

## 2015-01-27 NOTE — Patient Instructions (Addendum)
SB finish annual in last week in June, mammo same week  Pelvic u/s sooner

## 2015-01-28 ENCOUNTER — Encounter (HOSPITAL_BASED_OUTPATIENT_CLINIC_OR_DEPARTMENT_OTHER): Payer: Self-pay | Admitting: Internal Medicine

## 2015-01-28 LAB — HUMAN PAPILLOMAVIRUS (HPV): HUMAN PAPILLOMAVIRUS: NEGATIVE

## 2015-01-28 LAB — HEMOGLOBIN A1C
ESTIMATED AVERAGE GLUCOSE: 105 (ref 74–160)
HEMOGLOBIN A1C: 5.3 % (ref 4.0–5.6)

## 2015-01-30 ENCOUNTER — Encounter (HOSPITAL_BASED_OUTPATIENT_CLINIC_OR_DEPARTMENT_OTHER): Payer: Self-pay | Admitting: Obstetrics & Gynecology

## 2015-01-30 LAB — CYTOPATH, C/V, THIN LAYER

## 2015-02-17 ENCOUNTER — Encounter (HOSPITAL_BASED_OUTPATIENT_CLINIC_OR_DEPARTMENT_OTHER): Payer: Self-pay | Admitting: Internal Medicine

## 2015-02-17 ENCOUNTER — Ambulatory Visit (HOSPITAL_BASED_OUTPATIENT_CLINIC_OR_DEPARTMENT_OTHER): Payer: Self-pay | Admitting: Obstetrics & Gynecology

## 2015-02-17 DIAGNOSIS — Z1239 Encounter for other screening for malignant neoplasm of breast: Secondary | ICD-10-CM

## 2015-02-17 DIAGNOSIS — R140 Abdominal distension (gaseous): Secondary | ICD-10-CM

## 2015-02-17 LAB — US PELVIC NON-OB W TRANSVAG, 3D

## 2015-02-17 LAB — MA SCREENING MAMMO BILATERAL WITH CAD

## 2015-03-19 ENCOUNTER — Ambulatory Visit (HOSPITAL_BASED_OUTPATIENT_CLINIC_OR_DEPARTMENT_OTHER): Payer: No Typology Code available for payment source | Admitting: Internal Medicine

## 2015-03-19 ENCOUNTER — Encounter (HOSPITAL_BASED_OUTPATIENT_CLINIC_OR_DEPARTMENT_OTHER): Payer: Self-pay | Admitting: Internal Medicine

## 2015-03-19 VITALS — BP 100/70 | HR 82 | Temp 98.4°F | Wt 143.0 lb

## 2015-03-19 DIAGNOSIS — Z7184 Encounter for health counseling related to travel: Secondary | ICD-10-CM

## 2015-03-19 DIAGNOSIS — K589 Irritable bowel syndrome without diarrhea: Secondary | ICD-10-CM

## 2015-03-19 DIAGNOSIS — Z23 Encounter for immunization: Secondary | ICD-10-CM

## 2015-03-19 DIAGNOSIS — F3340 Major depressive disorder, recurrent, in remission, unspecified: Secondary | ICD-10-CM

## 2015-03-19 MED ORDER — DOXYCYCLINE HYCLATE 100 MG PO TABS
100.00 mg | ORAL_TABLET | Freq: Every day | ORAL | 0 refills | Status: AC
Start: 2015-03-19 — End: 2015-05-18

## 2015-03-19 NOTE — Progress Notes (Signed)
Loretta Martin is a 54 year old female who presents with a chief concern of wanting vaccinations for travel to [place].  The patient is traveling on August 1.  She is currently feeling well without acute distress.  She has mild asthma but is using inhalers only rarely.  I received documentation from her insurance company that she is not filling the Prozac routinely but the patient states she has adequate supplies and is about to refill that prescription.  She is taking it every day and feeling well on it without side effects or distress.  She has a chronic complaint of abdominal discomfort and sense of bloating.  She attributes this to the menopause and wants to know if she can see a menopause specialist.  She is concerned that the vaginal ring causes side effects.  No current episodes of diarrhea. No nausea or vomiting  We reviewed the CDC recommendations together for travel.  The patient is up-to-date on hepatitis A and B vaccinations as well as having had the flu shot in September.  Her tetanus recently expired.    OBJECTIVE:  BP 100/70  Pulse 82  Temp 98.4 F (36.9 C) (Oral)  Wt 64.9 kg (143 lb)  LMP 11/02/2009  SpO2 97%  BMI 26.16 kg/m2  Loretta Martin appears well, alert and oriented x 3, pleasant and cooperative.  Logical lucid and well groomed  ENT normal, neck supple and free of adenopathy.    (Z71.89) Encounter for counseling for travel  (primary encounter diagnosis)  Comment: The patient is traveling in [place] 2 areas that are considered at risk for malaria transmission.  We discussed that she would like to take a medicine daily as opposed to weekly.  I think Doxil cycle and is a reasonable treatment option for her.  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  Plan: Continue present management    (K58.9) Irritable bowel syndrome without diarrhea  Comment: Chronic problem patient is at baseline.  I did remind her that she hasn't appointment with gynecology on Monday and she can discuss her concerns with menopause without team     > 50 % of this visit spent in counseling  Face to face 20 minutes  I have reviewed the past medical, surgical, social and family history and updated these sections of EpicCare as relevant. All interim labs, test results, and consult notes were reviewed and discussed with Loretta Martin. Medications were reconciled during this visit and a current medication list was given to the patient at the end of the visit.

## 2015-03-19 NOTE — Progress Notes (Signed)
03/19/2015  Tdap VIS given prior to administration and reviewed with the patient and or legal guardian. Patient understands the disease and the vaccine. See immunization/Injection module or chart review for date of publication and additional information.  Epifanio Lesches, LPN

## 2015-03-24 ENCOUNTER — Telehealth (HOSPITAL_BASED_OUTPATIENT_CLINIC_OR_DEPARTMENT_OTHER): Payer: Self-pay

## 2015-03-24 ENCOUNTER — Encounter (HOSPITAL_BASED_OUTPATIENT_CLINIC_OR_DEPARTMENT_OTHER): Payer: Self-pay | Admitting: Obstetrics & Gynecology

## 2015-03-24 ENCOUNTER — Ambulatory Visit (HOSPITAL_BASED_OUTPATIENT_CLINIC_OR_DEPARTMENT_OTHER): Payer: No Typology Code available for payment source | Admitting: Obstetrics & Gynecology

## 2015-03-24 VITALS — BP 100/72 | Wt 146.0 lb

## 2015-03-24 DIAGNOSIS — R350 Frequency of micturition: Secondary | ICD-10-CM

## 2015-03-24 DIAGNOSIS — Z01411 Encounter for gynecological examination (general) (routine) with abnormal findings: Secondary | ICD-10-CM

## 2015-03-24 NOTE — Patient Instructions (Signed)
Uro referral  Appt 7/25 for ring removal

## 2015-03-24 NOTE — Progress Notes (Signed)
54 year old woman here for complete annual exam. She plans to see her primary care provider for an annual exam in the next year.     Obstetric History   G3  P3  T0  P1  A0  TAB0  SAB0  E0  M0  L3     Comment: C/S X 2 followed by VBAC       CC/HPI: concerns about urinary frequency  Had pelvic u/s this spring, no reason for frequency found    GYN History:  menopausal since age 1  Sexual History: sexually active, monogamous and female partner  Past GYN History: no change since last visit    Significant OB History: no change since last visit      Current Outpatient Prescriptions:  doxycycline (VIBRA-TABS) 100 MG tablet Take 1 tablet by mouth daily Disp: 60 tablet Rfl: 0   PROAIR HFA 108 (90 BASE) MCG/ACT inhaler Inhale 1 puff into the lungs every 4 (four) hours as needed for Wheezing. Disp: 3 Inhaler Rfl: 3   fluticasone (FLONASE) 50 MCG/ACT nasal spray 2 sprays by Each Nostril route daily. Disp: 1 Bottle Rfl: 11   propranolol (INDERAL) 10 MG tablet Take 1 tablet by mouth 2 (two) times daily. Disp: 180 tablet Rfl: 3   Cholecalciferol (VITAMIN D3) 400 UNITS tablet Take 1 tablet by mouth daily. Disp: 30 tablet Rfl: 11   FLUoxetine (PROZAC) 20 MG capsule Take 2 capsules by mouth daily. Disp: 180 capsule Rfl: 3   estradiol (ESTRING) 2 MG vaginal ring Place 2 mg vaginally every 3 (three) months. Disp: 1 each Rfl: 11   triamcinolone (KENALOG) 0.1 % cream Apply  topically daily. Disp: 45 g Rfl: 3   [DISCONTINUED] Estradiol (ESTROGEL) 0.75 MG/1.25 GM (0.06%) topical gel Place 1.25 g onto the skin every 3 (three) days. Disp: 50 g Rfl: 11   Multiple Vitamin (MULTIVITAMIN) TABS Take 1 tablet by mouth daily. Disp:  Rfl:    omega-3 acid ethyl esters (LOVAZA) 1 GM capsule Take 2 g by mouth 2 (two) times daily. Disp:  Rfl:      No current facility-administered medications for this visit.     Hydrocodone-Acetaminophen; Pcn [Penicillins]    Patient Active Problem List:     Nonspecific reaction to tuberculin skin test without active  tuberculosis     Major depressive disorder, recurrent, in remission     Mammographic microcalcification     IBS (irritable bowel syndrome)     DPH-CARE COORDINATION PROGRAM PARTICIPANT     Chronic low back pain     Presbyopia     PVD (posterior vitreous detachment)     Cysticercosis of central nervous system     Female stress incontinence     Family history of diabetes mellitus type II     Vitamin D deficiency     Cervical radiculopathy     Seasonal allergic rhinitis        Past Surgical History    LIG/TRNSXJ FLP TUBE ABDL/VAG APPR UNI/BI  1998    Comment Tubal Ligation    REDUCTION MAMMAPLASTY  2004    OB ANTEPARTUM CARE CESAREAN DLVR & POSTPARTUM      Comment c/section x 2    EXCISION EXCESSIVE SKIN & SUBQ TISSUE ABDOMEN      Comment abdominoplasty in [place]    BREAST BIOPSY & NEEDLE LOC WIR      TRURL RF FEMALE BLADDER NECK STRS URIN INCONT  Social History   Marital status: Divorced  Spouse name: N/A    Years of education: N/A  Number of children: 3     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    Alcohol use Yes    Comment: sporadically    Drug use: No    Sexual activity: Yes    Partners: Male    Birth control/ protection: Surgical    Comment: men age 13 q 2, menorrhagia 2006, past history of syphillis and rx , 1st SA age 44, 50 lietime partners, separated again from her husband.     Other Topics Concern    Military Service No    Blood Transfusions No    Occupational Exposure No    Hobby Hazards No    Sleep Concern No    Stress Concern No    Weight Concern Yes    Special Diet No    Exercise No    Comment: not much    Seat Belt Yes     Social History Narrative        To [place] from [place] in 2001    Lives with 2 children, 1 is married    2 older children working    Works Aeronautical engineer    Remarried to husband,        6/16: 2 younger  31 and 20 YO children live with her and her husband, one other child married and living in [place]. 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      Family History    Diabetes Mother     Hypertension Mother     Cancer - Lung FamHxNeg     Cancer - Colon FamHxNeg        ROS:  Constitutional: benign  Allergy: environmental  Endocrine: negative  Dermatologic: negative and uses sunscreen  Gastrointestinal: negative   Genitourinary: nocturia 2x per night and urinary frequency, occas wets a little. Had bladder neck injection in past, not so helpful  Musculoskeletal: negative  Neurological: negative  Psychiatric: negative    PHYSICAL EXAM:  Constitutional: well developed, well nourished [place] female  Skin: clear  Neurological: normal and alert and oriented  Lymphatic: no adenopathy noted  Thyroid: not enlarged  Chest: clear  Heart: regular rate and rhythm and no murmur  Breasts: no masses, skin, nipple or axillary changes  Abdomen: no masses or tenderness    PELVIC:deferred, done 4/16  Extremities: normal      ASSESSMENT & PLAN:  Urinary frequency, pelvic U/S WNL  Referral to urology  Follow up before trip to [place] for Estring removal.    COUNSELING:  diet and nutrition, calcium supplements, exercise, breast self awareness

## 2015-03-24 NOTE — Progress Notes (Unsigned)
Called patient to assist in booking apt. Was not able to reach patient. Left message on machine with call back number. If and when patient calls back, please transfer to appropriate department.

## 2015-04-22 ENCOUNTER — Ambulatory Visit (HOSPITAL_BASED_OUTPATIENT_CLINIC_OR_DEPARTMENT_OTHER): Payer: Self-pay | Admitting: Urology

## 2015-04-28 ENCOUNTER — Ambulatory Visit (HOSPITAL_BASED_OUTPATIENT_CLINIC_OR_DEPARTMENT_OTHER): Payer: No Typology Code available for payment source | Admitting: Obstetrics & Gynecology

## 2015-04-28 VITALS — BP 100/70 | Wt 147.0 lb

## 2015-04-28 DIAGNOSIS — T192XXA Foreign body in vulva and vagina, initial encounter: Secondary | ICD-10-CM

## 2015-04-28 MED ORDER — ESTRADIOL 2 MG VA RING
2.0000 mg | VAGINAL_RING | VAGINAL | 5 refills | Status: DC
Start: 2015-04-28 — End: 2016-04-26

## 2015-04-28 NOTE — Progress Notes (Signed)
S: 54 YO woman here for Estring removal.    Pt struggles with removing her estring. Unable to do it on her own. Going to [place] for one month next week.    Will place new one before she leaves.    O: appears well  Vag pink moist, estring in place  A: Estring removsal  P: Ring removed and discarded. Pt to place new ring when she fills new Rx.  Sched ring removal 3 months.

## 2015-04-28 NOTE — Patient Instructions (Signed)
Sb 3 months ring removal

## 2015-06-24 ENCOUNTER — Telehealth (HOSPITAL_BASED_OUTPATIENT_CLINIC_OR_DEPARTMENT_OTHER): Payer: Self-pay | Admitting: Ambulatory Care

## 2015-06-24 NOTE — Progress Notes (Signed)
Kim to offer 10AM tomorrow with pcp

## 2015-06-24 NOTE — Telephone Encounter (Signed)
-----   Message from [name] sent at 06/24/2015  4:23 PM EDT -----  Regarding: back/foot pain  Contact: [phone]  Loretta Martin is a 54 year old old female.    Patient's PCP: Ilda Foil, MD    In case we get disconnected what is the best number to reach you at today:  Home Phone [phone] (home)    Person calling:  Patient (self)    How can I help you today:   Sick Call:    What is your main complaint today?  Strong pain in lower pain 1 month, left foot pain can't hold pressure burning pain would like to be seen     The nurse will call you back within one hour.  She may call you at anytime. Can you remain at your telephone for the next 60 minutes?  Yes      Patient's language of care: [place]    Would you like an interpreter when the nurse calls you back?  NO

## 2015-06-25 ENCOUNTER — Ambulatory Visit (HOSPITAL_BASED_OUTPATIENT_CLINIC_OR_DEPARTMENT_OTHER): Payer: Self-pay | Admitting: Internal Medicine

## 2015-07-21 ENCOUNTER — Ambulatory Visit (HOSPITAL_BASED_OUTPATIENT_CLINIC_OR_DEPARTMENT_OTHER): Payer: No Typology Code available for payment source | Admitting: Obstetrics & Gynecology

## 2015-07-21 VITALS — BP 110/80 | Wt 146.0 lb

## 2015-07-21 DIAGNOSIS — N949 Unspecified condition associated with female genital organs and menstrual cycle: Secondary | ICD-10-CM

## 2015-07-21 NOTE — Patient Instructions (Signed)
SB 3 months for ring removal

## 2015-07-21 NOTE — Progress Notes (Signed)
SZ: 54 YO woman here for estring removal    Pt using Estring for atrophic vaginitis, doing well with this but is unable to remove ring herself when needs to change it.    Doing well, happy with the delivery of estrongen and has no menopausal sx with this measure.    O: appears well  Vag pink moist white discharge    A: atrophic vag helped by ring, needs rmoval  P: Ring removed easily.  Sched 3 month for removal of next ring which she will pick up in next 1-2 days.

## 2015-07-28 ENCOUNTER — Ambulatory Visit (HOSPITAL_BASED_OUTPATIENT_CLINIC_OR_DEPARTMENT_OTHER): Payer: No Typology Code available for payment source | Admitting: Obstetrics & Gynecology

## 2015-07-30 ENCOUNTER — Encounter (HOSPITAL_BASED_OUTPATIENT_CLINIC_OR_DEPARTMENT_OTHER): Payer: Self-pay | Admitting: Internal Medicine

## 2015-07-30 ENCOUNTER — Ambulatory Visit (HOSPITAL_BASED_OUTPATIENT_CLINIC_OR_DEPARTMENT_OTHER): Payer: No Typology Code available for payment source | Admitting: Internal Medicine

## 2015-07-30 VITALS — BP 104/62 | HR 82 | Temp 98.4°F | Wt 148.0 lb

## 2015-07-30 DIAGNOSIS — F3340 Major depressive disorder, recurrent, in remission, unspecified: Secondary | ICD-10-CM

## 2015-07-30 DIAGNOSIS — M25572 Pain in left ankle and joints of left foot: Secondary | ICD-10-CM

## 2015-07-30 DIAGNOSIS — M545 Low back pain: Secondary | ICD-10-CM

## 2015-07-30 DIAGNOSIS — G8929 Other chronic pain: Secondary | ICD-10-CM

## 2015-07-30 MED ORDER — DICLOFENAC SODIUM 75 MG PO TBEC
75.0000 mg | DELAYED_RELEASE_TABLET | Freq: Two times a day (BID) | ORAL | 0 refills | Status: AC
Start: 2015-07-30 — End: 2015-08-29

## 2015-07-30 NOTE — Progress Notes (Signed)
Loretta Martin is a 54 year old female who presents with a chief concern of diffuse myalgias everywhere.  She feels that pain continues to get worse and that she needs a specialty referral at multiple blood tests.  The chief pain that she is concerned about today is underneath the ball of the left foot in line with the second digit.  It's not very bad in the morning but it gets severe during the course of the day.  Especially if she has to do a significant amount of walking.  It does get swollen by the end of the day.  She is wearing flats to the exam but she states that during work and almost all the time she is wearing sneakers.  She has tried massaging the area and running cold water on it.  She is not using significant pain meds.  She is not exercising outside of work.  Her weight is up which she is not able to easily explain.  She has had back pain for many years off and on.  That too is diffuse.  She notes that at times her fingers are numb but it's hard to get further details about this.  No joint pain or swelling.  The patient reports using her Prozac regularly.  I did discuss with her for a significant amount of time that as she gets older she will have some mild arthralgias and arthritis but that exercise and activity do go a long way towards helping this.      OBJECTIVE:  .BP 104/62  Pulse 82  Temp 98.4 F (36.9 C) (Oral)  Wt 67.1 kg (148 lb)  LMP 11/02/2009  SpO2 98%  BMI 27.07 kg/m2  Alert and oriented x 3, pleasant and cooperative.  Logical lucid and well groomed.  Chest is clear to auscultation.  Heart sounds are normal, no murmurs, clicks, gallops or rubs.  Extremities, peripheral pulses and reflexes are normal.   There is minimal tenderness to palpation at the left foot under the MTP joint at the second phalange.  No pain with squeezing the foot laterally.  There is no swelling or erythema.    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  I did encourage her to get arch support and cushioning for her Pla shoes and to try anti-inflammatories for the short-term.  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  If she can manage to increase her activity this may help with both mood and muscle aches and pains.  I did counsel her about ways to incorporate more exercise into her daily life    (M54.5,  G89.29) Chronic midline low back pain without sciatica  Comment: As above.  The patient will do well with core strengthening exercises     > 50 % of this visit spent in counseling  Face to face 20 minutes  I have reviewed the past medical, surgical, social and family history and updated these sections of EpicCare as relevant. All interim labs, test results, and consult notes were reviewed and discussed with Loretta Martin. Medications were reconciled during this visit and a current medication list was given to the patient at  the end of the visit.

## 2015-09-08 ENCOUNTER — Encounter (HOSPITAL_BASED_OUTPATIENT_CLINIC_OR_DEPARTMENT_OTHER): Payer: Self-pay | Admitting: Internal Medicine

## 2015-09-08 ENCOUNTER — Ambulatory Visit (HOSPITAL_BASED_OUTPATIENT_CLINIC_OR_DEPARTMENT_OTHER): Payer: No Typology Code available for payment source | Admitting: Internal Medicine

## 2015-09-08 VITALS — BP 102/78 | HR 83 | Temp 98.0°F | Wt 146.0 lb

## 2015-09-08 DIAGNOSIS — Z833 Family history of diabetes mellitus: Secondary | ICD-10-CM

## 2015-09-08 DIAGNOSIS — G8929 Other chronic pain: Secondary | ICD-10-CM

## 2015-09-08 DIAGNOSIS — M545 Low back pain: Principal | ICD-10-CM

## 2015-09-08 DIAGNOSIS — F3340 Major depressive disorder, recurrent, in remission, unspecified: Secondary | ICD-10-CM

## 2015-09-08 DIAGNOSIS — Z6826 Body mass index (BMI) 26.0-26.9, adult: Secondary | ICD-10-CM | POA: Insufficient documentation

## 2015-09-08 NOTE — Progress Notes (Signed)
Loretta Martin is a 54 year old female who presents for follow-up of chronic back pain and concerns about overweight.  Since she saw me last month she has been using the stairs for exercise every day and she has lost weight and is feeling happy about this.  Otherwise she reports the pain as being the same.  She is still experiencing pain in the lower back and that the soles of both of her feet.  She is using arch supports as I recommended.  She feels her mood is good.  She tried taking anti-inflammatories for a couple of weeks with no significant change..  There is the first dusting of snow of the season today and the patient arrived 30 minutes late for a 20 minute appointment  We discussed the importance of doing exercises to strengthen other parts of her body and when I pointed out that the legs and knees where she has been getting most of the benefits of exercise or not hurting whereas the back is, she agreed this is absolutely true.  I did educate her about some exercises to strengthen the core by demonstrating some and showing her some pictures online.  She asked for a printout of the 7 minute exercise that we talked about in the past.    OBJECTIVE:  BP 102/78  Pulse 83  Temp 98 F (36.7 C) (Oral)  Wt 66.2 kg (146 lb)  LMP 11/02/2009  SpO2 96%  BMI 26.7 kg/m2  Loretta Martin appears well, alert and oriented x 3, pleasant and cooperative.  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  The patient is motivated to keep going since she is losing weight    (Z83.3) Family history of  diabetes mellitus type II  Comment: Exercise is of chief importance    [id number]) BMI 26.0-26.9,adult  Comment: The patient is doing relatively well losing 2 pounds in the last month  Plan: Continue exercise

## 2015-09-14 ENCOUNTER — Emergency Department (HOSPITAL_BASED_OUTPATIENT_CLINIC_OR_DEPARTMENT_OTHER)
Admission: RE | Admit: 2015-09-14 | Disposition: A | Discharge status: Home / Self Care | Payer: Self-pay | Source: Emergency Department | Attending: Physician Assistant | Admitting: Physician Assistant

## 2015-09-14 LAB — POC URINALYSIS
BILIRUBIN, URINE: NEGATIVE
GLUCOSE,URINE: NEGATIVE
KETONE, URINE: NEGATIVE
LEUKOCYTE ESTERASE: NEGATIVE
NITRITE, URINE: NEGATIVE
OCCULT BLOOD, URINE: NEGATIVE
PH URINE: 5 (ref 5.0–8.0)
PROTEIN, URINE: NEGATIVE
SPECIFIC GRAVITY, URINE: 1.01 (ref 1.003–1.030)
UROBILINOGEN URINE: 0.2 (ref 0.2–1.0)

## 2015-09-14 MED ORDER — LIDOCAINE 5 % EX PTCH
1.0000 | MEDICATED_PATCH | Freq: Once | CUTANEOUS | Status: DC
Start: 2015-09-14 — End: 2015-09-14
  Administered 2015-09-14: 1 via TOPICAL
  Filled 2015-09-14: qty 1

## 2015-09-14 MED ORDER — KETOROLAC TROMETHAMINE 60 MG/2ML IM SOLN
60.0000 mg | Freq: Once | INTRAMUSCULAR | Status: AC
Start: 2015-09-14 — End: 2015-09-14
  Administered 2015-09-14: 60 mg via INTRAMUSCULAR
  Filled 2015-09-14: qty 2

## 2015-09-14 MED ORDER — LIDOCAINE 5 % EX PTCH
1.00 | MEDICATED_PATCH | Freq: Two times a day (BID) | CUTANEOUS | 0 refills | Status: AC | PRN
Start: 2015-09-14 — End: 2015-09-19

## 2015-09-14 MED ORDER — NAPROXEN 500 MG PO TABS
500.0000 mg | ORAL_TABLET | Freq: Two times a day (BID) | ORAL | 0 refills | Status: DC | PRN
Start: 2015-09-14 — End: 2015-09-19

## 2015-09-14 NOTE — Narrator Note (Signed)
Patient Disposition    Patient education for diagnosis, medications, activity, diet and follow-up.  Patient left ED 3:03 PM.  Patient rep received written instructions.  Interpreter to provide instructions: Yes    Patient belongings with patient: YES    Have all existing LDAs been addressed? N/A    Have all IV infusions been stopped? N/A    Discharged to: Discharged to home

## 2015-09-14 NOTE — Discharge Instructions (Signed)
DIAGNOSIS & TREATMENT:  You were seen in a [facility] Emergency Department for low back pain.  We did not think you needed any xrays.     Your urine was clean.    This is likely muscle or nerve pain.     RETURN TO THE ER if you have any weakness to your legs, nausea, vomiting, can't walk, worsening pain, change to your bathroom habits (constipation, incontinence)      FURTHER CARE & MEDICATIONS:  Naprosyn - 1 tab every 12 hours as needed.  Do not take any advil, alleve, ibuprofen, diclofenac as these are the same medications.    Lidoderm patch - apply 1 patch to affected area every 12 hours as needed for pain       WHEN SHOULD YOU BE SEEN NEXT?   Please call your doctor and be seen with in the next 2-3 days for re-evaluation.    If you do not have a primary care doctor or would like to transfer your primary care to [facility], please call [phone] to set up an appointment.    If you do not have a doctor or cannot see your doctor, please return to the ER for any worsening symptoms.

## 2015-09-14 NOTE — ED Provider Notes (Signed)
ED nursing record was reviewed. Prior records as available electronically through the Epic record were reviewed.    Patient is Portugeses speaking and interview, exam, and final disposition were conducted via an official hospital interpreter.    HPI:    This 54 year old female patient presents to the Emergency Department with chief complaint of left low back pain worsening since Friday.  Patient recalls sitting for 30 minutes driving, and then when she got up and started to have sharp pains to her left low back.  It is worse with positional changes.  Doesn't radiate down her legs.  Denies any incontinence, groin numbness, weakness to her legs. Reports no urinary symptoms. Has had chronic low back pain before, but this feels more painful and hasn't gone away.   No meds taken today.     Pt did not report any red flags - no hx of cancer, no IVDA, no prolonged steroid use, no fever, no unexplained weight loss, no hx of osteoporosis, no urinary retention, no motor weakness or foot drop.    ROS: Pertinent positives were reviewed as per the HPI above.       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    Comment: treated [facility] 11/97 CSF VDRL neg 9/00 RPR 9/00 1:2 repeat RPR yearly     Nonspecific abnormal results of thyroid function study 08/31/2004    Comment: increased TSH 4.19 6/00; TSH nl 9/00; 05/04/99 - wnl    Nonspecific reaction to tuberculin skin test without active tuberculosis 08/31/2004    Comment: +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 - had negative CXR pp at [facility] referred to TB clinic    Presbyopia 04/23/2009    PVD (posterior vitreous detachment) 04/23/2009    Trichomoniasis, unspecified 08/31/2004    Comment: 10/97    Urinary tract infection, site not specified 08/31/2004    Comment: 4/02, 6/02     Patient Active Problem  List:     Nonspecific reaction to tuberculin skin test without active tuberculosis     Major depressive disorder, recurrent, in remission ([place])     Mammographic microcalcification     IBS (irritable bowel syndrome)     DPH-CARE COORDINATION PROGRAM PARTICIPANT     Chronic low back pain     Presbyopia     PVD (posterior vitreous detachment)     Cysticercosis of central nervous system     Female stress incontinence     Family history of diabetes mellitus type II     Vitamin D deficiency     Cervical radiculopathy     Seasonal allergic rhinitis     BMI 26.0-26.9,adult        Past Surgical History:   Past Surgical History    LIG/TRNSXJ FLP TUBE ABDL/VAG APPR UNI/BI  1998    Comment Tubal Ligation    REDUCTION MAMMAPLASTY  2004    OB ANTEPARTUM CARE CESAREAN DLVR & POSTPARTUM      Comment c/section x 2    EXCISION EXCESSIVE SKIN & SUBQ TISSUE ABDOMEN      Comment abdominoplasty in [place]    BREAST BIOPSY & NEEDLE LOC WIR      TRURL RF FEMALE BLADDER NECK STRS URIN INCONT           Allergies:  Review of Patient's Allergies indicates:   Hydrocodone-acetami*    Nausea and Vomiting   Pcn [penicillins]           Comment:Got a urine infection after using    Medications:   No current facility-administered medications on file prior to encounter.   Current Outpatient Prescriptions on File Prior to Encounter:  estradiol (ESTRING) 2 MG vaginal ring Place 2 mg vaginally every 3 (three) months Disp: 1 each Rfl: 5   PROAIR HFA 108 (90 BASE) MCG/ACT inhaler Inhale 1 puff into the lungs every 4 (four) hours as needed for Wheezing. Disp: 3 Inhaler Rfl: 3   fluticasone (FLONASE) 50 MCG/ACT nasal spray 2 sprays by Each Nostril route daily. Disp: 1 Bottle Rfl: 11   propranolol (INDERAL) 10 MG tablet Take 1 tablet by mouth 2 (two) times daily. Disp: 180 tablet Rfl: 3   Cholecalciferol (VITAMIN D3) 400 UNITS tablet Take 1 tablet by mouth daily. Disp: 30 tablet Rfl: 11   FLUoxetine (PROZAC) 20 MG capsule Take 2 capsules by mouth daily. Disp:  180 capsule Rfl: 3   [DISCONTINUED] Estradiol (ESTROGEL) 0.75 MG/1.25 GM (0.06%) topical gel Place 1.25 g onto the skin every 3 (three) days. Disp: 50 g Rfl: 11   Multiple Vitamin (MULTIVITAMIN) TABS Take 1 tablet by mouth daily. Disp:  Rfl:    omega-3 acid ethyl esters (LOVAZA) 1 GM capsule Take 2 g by mouth 2 (two) times daily. Disp:  Rfl:        Social History:   Social History   Marital status: Divorced  Spouse name: N/A    Years of education: N/A  Number of children: 3     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    Alcohol use Yes    Comment: sporadically    Drug use: No    Sexual activity: Yes    Partners: Male    Birth control/ protection: Surgical    Comment: men age 16 q 36, menorrhagia 2006, past history of syphillis and rx , 1st SA age 38, 39 lietime partners, separated again from her husband.     Other Topics Concern    Military Service No    Blood Transfusions No    Occupational Exposure No    Hobby Hazards No    Sleep Concern No    Stress Concern No    Weight Concern Yes    Special Diet No    Exercise No    Comment: not much    Seat Belt Yes     Social History Narrative        To [place] from [place] in 2001    Lives with 2 children, 1 is married    2 older children working    Works Aeronautical engineer    Remarried to husband,        6/16: 2 younger 22 and 38 YO children live with her and her husband, one other child married and living in [place]. Housecleaning       Family History: noncontributory    Physical Exam:  BP 122/78  Pulse 104  Temp 97.6 F  Resp 16  Wt 65.3 kg (144 lb)  LMP 11/02/2009  SpO2 99%  BMI 26.34 kg/m2    GENERAL: Well appearing, No acute distress,  non-toxic.   SKIN:  Warm, dry, no erythema, rash to back   EYES: PERRL, EOMI intact.  NECK:  Supple  LUNGS:  CTAB. Full aeration. No wheezes, rales, rhonchi.   HEART:   RRR.  No murmurs, rubs, or gallops.   BACK: no CVA tenderness, no midline tenderness   ABDOMEN:  Normal BS, Soft, NTND.     BACK: No spinal tenderness. Neg paraspinal muscle spasm, negative SLR bilaterally, low back NTTP  EXTREMITIES:  No obvious deformities. LE  5/5 strength, FROM to hips, knees, ankles. Sensation intact. DP/TP pulse 2+  NEUROLOGIC:  Alert and oriented x3; speaking in clear fluent sentences. CNsII-XII symmetrical and intact. Sensation intact to light touch throughout. Gait stable.       RESULTS  Results for orders placed or performed during the hospital encounter of 09/14/15 (from the past 24 hour(s))   POC Urinalysis    Collection Time: 09/14/15  2:31 PM   Result Value    COLOR YELLOW    CLARITY CLEAR    GLUCOSE,URINE NEGATIVE    BILIRUBIN, URINE NEGATIVE    KETONE, URINE NEGATIVE    SPECIFIC GRAVITY, URINE 1.010    UROBILINOGEN URINE 0.2    OCCULT BLOOD, URINE NEGATIVE    PH URINE 5.0    PROTEIN, URINE NEGATIVE    NITRITE, URINE NEGATIVE    LEUKOCYTE ESTERASE NEGATIVE        MEDICATIONS ADMINISTERED ON THIS VISIT    Medication Orders Placed This Encounter      ketorolac (TORADOL) injection 60 mg      lidocaine (LIDODERM) 5 % patch 1 patch      lidocaine (LIDODERM) 5 % patch          Sig: Place 1 patch onto the skin every 12 (twelve) hours as needed          Dispense:  15 patch          Refill:  0      naproxen (NAPROSYN) 500 MG tablet          Sig: Take 1 tablet by mouth every 12 (twelve) hours as needed for Pain as needed for pain. Take with food          Dispense:  12 tablet          Refill:  0       ED Course and Medical Decision-making:  This 54 year old female patient presents to the Emergency Department with chief complaint of left low back pain worsening since Friday.   Urine dip with no hematuria. Given pain location, less likely for renal colic. No hx of osteoporosis, trauma to suggest fracture, no xrays will done at this time.  No fever, hx of IV drug use, midline tenderness to suggest  infectious etiology. Neuro exam within normal limits. No sxs to suggest cauda equina syndrome. I do not think she needed emergent imaging with CT/MRI.  Likely either MSK or neuropathic pain from herniated disc. Improved with toradol IM and lidocaine patch. Will Rx the same.   F/u with PCP in 2-3 days.     Diagnosis: L low back pain     Condition: stable  Disposition: home       Verlee Rossetti PA-C

## 2015-09-14 NOTE — ED Triage Note (Signed)
ambulatory to ED  Alert  Lower back pain   Radiating down to both hips  Pain started on Friday  Had back pain before but not as severe at this one  No pain medications taken at home

## 2015-09-16 ENCOUNTER — Telehealth (HOSPITAL_BASED_OUTPATIENT_CLINIC_OR_DEPARTMENT_OTHER): Payer: Self-pay | Admitting: Ambulatory Care

## 2015-09-16 DIAGNOSIS — M545 Low back pain, unspecified: Secondary | ICD-10-CM

## 2015-09-16 DIAGNOSIS — G8929 Other chronic pain: Secondary | ICD-10-CM

## 2015-09-16 NOTE — Telephone Encounter (Signed)
-----   Message from [name] sent at 09/16/2015 10:39 AM EST -----  Regarding: low back pain  Contact: [phone]  Sidrah Deshpande is a 54 year old old female.    Patient's PCP: Ilda Foil, MD    In case we get disconnected what is the best number to reach you at today:  Home Phone [phone] (home)    Person calling:  Patient (self)    How can I help you today:   Patient went to Ed on Sunday with low back pain, is still having a lot of pain, wants to have and MRI done.    Patient's language of care: [place]    Would you like an interpreter when the nurse calls you back?  YES  [place]

## 2015-09-16 NOTE — Progress Notes (Signed)
Left message for pt we are returning her call

## 2015-09-16 NOTE — Progress Notes (Signed)
I don't agree with the MRI at this time, but will put in a referral to ortho.

## 2015-09-16 NOTE — Progress Notes (Signed)
Spoke with pt. Asking for an MRI of back. Has had back pain since last Friday. Explained unlikely that insurance would pay for this. She still has low back pain both sides. Does not radiate, no numbness or tingling extrems. Asked if interested in physical therapy- says it never helped her in the past.  Told her will discuss with pcp and get back to her

## 2015-09-17 NOTE — Progress Notes (Signed)
LM for patient with plan per Dr. Nyoka Cowden. Let her know ortho referral has been placed and is in process.

## 2015-09-30 ENCOUNTER — Ambulatory Visit (HOSPITAL_BASED_OUTPATIENT_CLINIC_OR_DEPARTMENT_OTHER): Payer: No Typology Code available for payment source | Admitting: Physical Medicine & Rehabilitation

## 2015-10-22 ENCOUNTER — Ambulatory Visit (HOSPITAL_BASED_OUTPATIENT_CLINIC_OR_DEPARTMENT_OTHER): Payer: No Typology Code available for payment source | Admitting: Obstetrics & Gynecology

## 2015-10-22 VITALS — BP 100/60 | Wt 145.0 lb

## 2015-10-22 DIAGNOSIS — N951 Menopausal and female climacteric states: Secondary | ICD-10-CM

## 2015-10-22 NOTE — Patient Instructions (Signed)
SB 3 months estrogen ring removal  July for GYN annual

## 2015-10-22 NOTE — Progress Notes (Signed)
S: 55 YO woman here for estring removal.  Pt has difficulty removing the ring herself.    Doing well with this hormone delivery method, no vag dryness    O: appears well   vag pink moist  Ring in place    A: Estring in place--pt unable to remove it herself    P: Estring removed easily  Pt will pick up refill in next couple of days and place it.  RTC 3 months, GYN visit 3 months later.

## 2015-11-10 ENCOUNTER — Ambulatory Visit (HOSPITAL_BASED_OUTPATIENT_CLINIC_OR_DEPARTMENT_OTHER): Payer: No Typology Code available for payment source | Admitting: Internal Medicine

## 2015-11-17 ENCOUNTER — Ambulatory Visit (HOSPITAL_BASED_OUTPATIENT_CLINIC_OR_DEPARTMENT_OTHER): Payer: No Typology Code available for payment source | Admitting: Internal Medicine

## 2015-12-01 ENCOUNTER — Other Ambulatory Visit (HOSPITAL_BASED_OUTPATIENT_CLINIC_OR_DEPARTMENT_OTHER): Payer: Self-pay | Admitting: Internal Medicine

## 2015-12-01 MED ORDER — FLUOXETINE HCL 20 MG PO CAPS
40.0000 mg | ORAL_CAPSULE | Freq: Every day | ORAL | 3 refills | Status: DC
Start: 2015-12-01 — End: 2017-01-11

## 2015-12-01 NOTE — Progress Notes (Signed)
PER Pharmacy, Loretta Martin is a 55 year old female has requested a refill of FLUOXETINE.        Last Office Visit: 09/08/2015 with PCP  Last Physical Exam: 03/24/2015        Other Med Adult:  Most Recent BP Reading(s)  10/22/15 : 100/60          CHOLESTEROL (mg/dl)   Date Value   12/28/2010 207 (H)   ----------    LOW DENSITY LIPOPROTEIN DIRECT (mg/dl)   Date Value   12/28/2010 130 (H)   ----------    HIGH DENSITY LIPOPROTEIN (mg/dl)   Date Value   12/28/2010 57   ----------    TRIGLYCERIDES (mg/dl)   Date Value   08/10/2005 79   ----------        THYROID SCREEN TSH REFLEX FT4 (uIU/mL)   Date Value   10/31/2008 2.83   ----------        TSH (THYROID STIM HORMONE) (uIU/mL)   Date Value   03/25/2014 3.500   ----------      HEMOGLOBIN A1C (%)   Date Value   01/27/2015 5.3   ----------        INR (no units)   Date Value   05/12/2009 1.0 (L)   ----------        Documented patient preferred pharmacies:    [facility] - [place], [place]  Phone: [phone] Fax: [phone]

## 2015-12-01 NOTE — Progress Notes (Signed)
.  [facility] INTERNAL MED    Person calling on behalf of patient: Pharmacy    May list multiple medications in this section    Medicine Name: fluoexitine      Documented patient preferred pharmacies:   [facility] - [place], [place]  Phone: [phone] Fax: [phone]

## 2016-01-14 ENCOUNTER — Other Ambulatory Visit (HOSPITAL_BASED_OUTPATIENT_CLINIC_OR_DEPARTMENT_OTHER): Payer: Self-pay | Admitting: Internal Medicine

## 2016-01-14 MED ORDER — VITAMIN D3 10 MCG (400 UNIT) PO TABS
400.0000 [IU] | ORAL_TABLET | Freq: Every day | ORAL | 11 refills | Status: DC
Start: 2016-01-14 — End: 2017-02-22

## 2016-01-14 NOTE — Progress Notes (Signed)
.  [facility] INTERNAL MED    Person calling on behalf of patient: Pharmacy    May list multiple medications in this section    Medicine Name: VITAMIN D3      Documented patient preferred pharmacies:   [facility] - [place], [place]  Phone: [phone] Fax: [phone]

## 2016-01-14 NOTE — Progress Notes (Signed)
PER Pharmacy, Loretta Martin is a 55 year old female has requested a refill of VITAMIN D.          Last Office Visit: 09/08/2015 with PCP  Last Physical Exam: 03/24/2015        Other Med Adult:  Most Recent BP Reading(s)  10/22/15 : 100/60          Cholesterol (mg/dl)   Date Value   12/28/2010 207 (H)   ----------    LOW DENSITY LIPOPROTEIN DIRECT (mg/dl)   Date Value   12/28/2010 130 (H)   ----------    HIGH DENSITY LIPOPROTEIN (mg/dl)   Date Value   12/28/2010 57   ----------    TRIGLYCERIDES (mg/dl)   Date Value   08/10/2005 79   ----------        THYROID SCREEN TSH REFLEX FT4 (uIU/mL)   Date Value   10/31/2008 2.83   ----------        TSH (THYROID STIM HORMONE) (uIU/mL)   Date Value   03/25/2014 3.500   ----------      HEMOGLOBIN A1C (%)   Date Value   01/27/2015 5.3   ----------        INR (no units)   Date Value   05/12/2009 1.0 (L)   ----------      SODIUM (mmol/L)   Date Value   05/12/2009 138   ----------      POTASSIUM (mmol/L)   Date Value   05/12/2009 4.8   ----------          CREATININE (mg/dl)   Date Value   05/12/2009 0.5   ----------     Documented patient preferred pharmacies:    [facility] - [place], [place]  Phone: [phone] Fax: [phone]

## 2016-01-21 ENCOUNTER — Ambulatory Visit (HOSPITAL_BASED_OUTPATIENT_CLINIC_OR_DEPARTMENT_OTHER): Payer: No Typology Code available for payment source | Admitting: Obstetrics & Gynecology

## 2016-01-21 VITALS — BP 100/70 | Wt 146.0 lb

## 2016-01-21 DIAGNOSIS — N898 Other specified noninflammatory disorders of vagina: Secondary | ICD-10-CM

## 2016-01-21 NOTE — Progress Notes (Signed)
S: 55 YO woman here for removal of her estring which she replaces Q 3 months. Also reports that often toward the end of the 3 months of each ring she has some vaginal itching. Resolves few days after she places new ring. No odor, no new ptr    No other concerns. No vag discharge changes  Has GYN annul 7/17    O: appears well  Vag ext no erythema, slight flaky skin on outer labias  Vag secretions normal appearance    A: Ring removal  Itching at end of ring cycle, no sign of infection on exam    P: Estring removed without difficulty  Discussed likelihood that the itching is due to decreased estrogen effect when ring is at end of the 3 month time frame  No sign of vaginal infection today.  RTC as sched for annual and ring removal 3 months.

## 2016-04-26 ENCOUNTER — Encounter (HOSPITAL_BASED_OUTPATIENT_CLINIC_OR_DEPARTMENT_OTHER): Payer: Self-pay | Admitting: Obstetrics & Gynecology

## 2016-04-26 ENCOUNTER — Ambulatory Visit (HOSPITAL_BASED_OUTPATIENT_CLINIC_OR_DEPARTMENT_OTHER): Payer: No Typology Code available for payment source | Admitting: Obstetrics & Gynecology

## 2016-04-26 VITALS — BP 106/80 | Ht 62.0 in | Wt 142.0 lb

## 2016-04-26 DIAGNOSIS — N393 Stress incontinence (female) (male): Secondary | ICD-10-CM

## 2016-04-26 DIAGNOSIS — Z01411 Encounter for gynecological examination (general) (routine) with abnormal findings: Secondary | ICD-10-CM

## 2016-04-26 DIAGNOSIS — N952 Postmenopausal atrophic vaginitis: Secondary | ICD-10-CM

## 2016-04-26 LAB — FECAL OCCULT (POINT OF CARE) OFFICE/INPATIENT TEST 1 CARD
LOT #: 461
OCCULT BLOOD: NEGATIVE

## 2016-04-26 MED ORDER — ESTRADIOL 2 MG VA RING
2.0000 mg | VAGINAL_RING | VAGINAL | 3 refills | Status: DC
Start: 2016-04-26 — End: 2017-02-22

## 2016-04-26 NOTE — Addendum Note (Signed)
Addended by: Reita Chard on: 04/26/2016 05:36 PM     Modules accepted: Orders

## 2016-04-26 NOTE — Progress Notes (Signed)
55 year old woman here for complete annual exam. She plans to see her primary care provider for an annual exam in the next year.     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 Did not go to uro visit sched after this visit last year  Needs estring removed    GYN History:  post menopausal    Sexual History: sexually active, monogamous and female partner  Past GYN History: SUI almost daily, if bladder full and cough/sneeze    Significant OB History: no change since last visit      Current Outpatient Prescriptions:  estradiol (ESTRING) 2 MG vaginal ring Place 2 mg vaginally every 3 (three) months Disp: 1 each Rfl: 3   Cholecalciferol (VITAMIN D3) 400 UNITS tablet Take 1 tablet by mouth daily Disp: 30 tablet Rfl: 11   FLUoxetine (PROZAC) 20 MG capsule Take 2 capsules by mouth daily Disp: 180 capsule Rfl: 3   naproxen (NAPROSYN) 500 MG tablet Take 1 tablet by mouth every 12 (twelve) hours as needed for Pain as needed for pain. Take with food Disp: 12 tablet Rfl: 0   PROAIR HFA 108 (90 BASE) MCG/ACT inhaler Inhale 1 puff into the lungs every 4 (four) hours as needed for Wheezing. Disp: 3 Inhaler Rfl: 3   fluticasone (FLONASE) 50 MCG/ACT nasal spray 2 sprays by Each Nostril route daily. Disp: 1 Bottle Rfl: 11   propranolol (INDERAL) 10 MG tablet Take 1 tablet by mouth 2 (two) times daily. Disp: 180 tablet Rfl: 3   [DISCONTINUED] Estradiol (ESTROGEL) 0.75 MG/1.25 GM (0.06%) topical gel Place 1.25 g onto the skin every 3 (three) days. Disp: 50 g Rfl: 11   Multiple Vitamin (MULTIVITAMIN) TABS Take 1 tablet by mouth daily. Disp:  Rfl:    omega-3 acid ethyl esters (LOVAZA) 1 GM capsule Take 2 g by mouth 2 (two) times daily. Disp:  Rfl:      No current facility-administered  medications for this visit.     Hydrocodone-Acetaminophen; Pcn [Penicillins]    Patient Active Problem List:     Nonspecific reaction to tuberculin skin test without active tuberculosis     Major depressive disorder, recurrent, in remission ([place])     Mammographic microcalcification     IBS (irritable bowel syndrome)     DPH-CARE COORDINATION PROGRAM PARTICIPANT     Chronic low back pain     Presbyopia     PVD (posterior vitreous detachment)     Cysticercosis of central nervous system     Female stress incontinence     Family history of diabetes mellitus type II     Vitamin D deficiency     Cervical radiculopathy     Seasonal allergic rhinitis     BMI 26.0-26.9,adult      Past Surgical History:  No date: BREAST BIOPSY & NEEDLE LOC WIR  No date: EXCISION EXCESSIVE SKIN & SUBQ TISSUE ABDOMEN      Comment: abdominoplasty in [place]  1998: LIG/TRNSXJ FLP TUBE ABDL/VAG APPR UNI/BI      Comment: Tubal Ligation  No date: OB ANTEPARTUM CARE CESAREAN DLVR & POSTPARTUM  Comment: c/section x 2  2004: REDUCTION MAMMAPLASTY  No date: TRURL RF FEMALE BLADDER NECK STRS URIN INCONT      Social History   Marital status: Divorced  Spouse name: N/A    Years of education: N/A  Number of children: 3     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    Alcohol use Yes    Comment: sporadically    Drug use: No    Sexual activity: Yes    Partners: Male    Birth control/ protection: Surgical    Comment: men age 12 q 7, menorrhagia 2006, past history of syphillis and rx , 1st SA age 79, 54 lietime partners, separated again from her husband.     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 Housecleaning             Exercise: regularly housecleaning and no formal ex  Diet: adequate fruits & vegetables, adequate calcium.      Family History    Diabetes Mother     Hypertension Mother     Cancer - Lung FamHxNeg     Cancer - Colon FamHxNeg        ROS:  Constitutional: benign  Allergy: none  Endocrine: negative  Dermatologic: negative  Cardiovascular: negative  Respiratory: negative  Gastrointestinal: negative  Genitourinary: negative  Musculoskeletal: arm pain  Neurological: negative  Psychiatric: negative    PHYSICAL EXAM:  Constitutional: well developed, well nourished [place] female and BMI: NL  Skin: clear  Neurological: normal and alert and oriented  Neck: supple and no adenopathy  Thyroid: not enlarged  Chest: clear  Heart: regular rate and rhythm  Breasts: no masses, skin, nipple or axillary changes  Abdomen: no masses or tenderness    PELVIC:  External Genitalia: normal architecture  Urethral Meatus: normal size and location, without lesions or prolapse  Urethra: without masses, tenderness or scarring  Bladder: without fullness, masses or tenderness  Vagina: well rugated and no lesions. 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

## 2016-05-24 ENCOUNTER — Ambulatory Visit (HOSPITAL_BASED_OUTPATIENT_CLINIC_OR_DEPARTMENT_OTHER): Payer: Self-pay | Admitting: Ambulatory Care

## 2016-05-24 NOTE — Telephone Encounter (Signed)
Regarding: Foot Pain   ----- Message from Darliss Cheney sent at 05/24/2016  4:35 PM EDT -----  Loretta Martin LU:[id number], 55 year old, female    Calls today:  Patient called saying she has been having foot pain and that it is swollen. Patient said its very hard to walk and that it feels like a burning sensation. She has been having this pain for over a month.   Person calling on behalf of patient: Patient (self)    CALL BACK NUMBER: [phone]  Best time to call back:   Cell phone:   Other phone:    Patient's language of care: English    Patient does not need an interpreter.    Patient's PCP: Ilda Foil, MD

## 2016-05-24 NOTE — Telephone Encounter (Signed)
Foot pain ongoing for >one month.  Would like to see pcp  Given appt Wed 05/26/16 with Dr.Green

## 2016-05-26 ENCOUNTER — Ambulatory Visit (HOSPITAL_BASED_OUTPATIENT_CLINIC_OR_DEPARTMENT_OTHER): Payer: No Typology Code available for payment source | Admitting: Internal Medicine

## 2016-05-26 VITALS — BP 110/78 | HR 92 | Temp 98.6°F | Wt 142.0 lb

## 2016-05-26 DIAGNOSIS — Z13220 Encounter for screening for lipoid disorders: Secondary | ICD-10-CM

## 2016-05-26 DIAGNOSIS — F3340 Major depressive disorder, recurrent, in remission, unspecified: Secondary | ICD-10-CM

## 2016-05-26 DIAGNOSIS — R5383 Other fatigue: Principal | ICD-10-CM

## 2016-05-26 DIAGNOSIS — M722 Plantar fascial fibromatosis: Secondary | ICD-10-CM

## 2016-05-26 DIAGNOSIS — Z833 Family history of diabetes mellitus: Secondary | ICD-10-CM

## 2016-05-26 DIAGNOSIS — J301 Allergic rhinitis due to pollen: Secondary | ICD-10-CM

## 2016-05-26 DIAGNOSIS — M791 Myalgia, unspecified site: Secondary | ICD-10-CM

## 2016-05-26 DIAGNOSIS — R5381 Other malaise: Secondary | ICD-10-CM

## 2016-05-26 LAB — TSH (THYROID STIMULATING HORMONE): TSH (THYROID STIM HORMONE): 8.3 u[IU]/mL — ABNORMAL HIGH (ref 0.358–3.740)

## 2016-05-26 LAB — CHOLESTEROL: Cholesterol: 221 mg/dL (ref 0–239)

## 2016-05-26 LAB — LOW DENSITY LIPOPROTEIN DIRECT: LOW DENSITY LIPOPROTEIN DIRECT: 144 mg/dL (ref 0–189)

## 2016-05-26 LAB — HIGH DENSITY LIPOPROTEIN: HIGH DENSITY LIPOPROTEIN: 50 mg/dL (ref 40–?)

## 2016-05-26 MED ORDER — FLUTICASONE PROPIONATE 50 MCG/ACT NA SUSP
2.0000 | Freq: Every day | NASAL | 11 refills | Status: DC
Start: 2016-05-26 — End: 2018-08-08

## 2016-05-26 MED ORDER — PROAIR HFA 108 (90 BASE) MCG/ACT IN AERS
1.0000 | INHALATION_SPRAY | RESPIRATORY_TRACT | 3 refills | Status: DC | PRN
Start: 2016-05-26 — End: 2017-08-30

## 2016-05-26 NOTE — Progress Notes (Signed)
Loretta Martin is a 55 year old female who presents with a chief concern of ongoing pain at her arms and legs for more than a year.  She sometimes has numbness in the hand which goes away if she makes a fist for about 5 minutes.  The pain and numbness appears at both arms equally.  She also feels diffusely weak and her whole body.  For about a year she has felt like she has no energy.  She has been exercising a little bit, she uses the treadmill about once a week, but is sleeping more and more.  The patient has been treated for depression in the past but her mood is a little better.  See PHQ-9.  She is seen with her daughter today who states the patient does snore but she has not witnessed any apneic episodes.  The patient does fall asleep easily and stays asleep for about 11 hours.  She does wake up yawning and still sleepy.  The patient also notes severe pain at the left plantar surface of the foot just proximal to the MTPs.  She states that she has been having this off and on for about a year but it was severe on Sunday.  No remembered trauma and she does wear sneakers with inserts most the time.  See referral to a sleep study for Epworth score  The patient is due for a lipid study.  I did review with her the healthcare proxy and she confirms that her oldest son is still the proxy  The patient is not currently using her allergy medicines but she does need refills since the last prescription expired  OBJECTIVE:  BP 110/78   Pulse 92   Temp 98.6 F (37 C) (Oral)   Wt 64.4 kg (142 lb)   LMP 11/02/2009   SpO2 96%   BMI 25.97 kg/m2  Lezlie Sweetland appears well, alert and oriented x 3, pleasant and cooperative.  Logical lucid and well groomed  ENT normal,  Thyroid not palpable, not enlarged, no nodules detected.  Chest is clear to auscultation.  Heart sounds are normal, no murmurs, clicks, gallops or rubs.  Abdomen is soft, no tenderness, masses or organomegaly.  Extremities, peripheral pulses and reflexes are normal.      (R53.81,  R53.83) Malaise and fatigue  (primary encounter diagnosis)  Comment: Differential is large.  I'm not sure whether patient would be developing sleep apnea at this point but she does have significant hypersomnolence and snoring.  She also does have a family history of diabetes.  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  She is staying on fluoxetine.  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  I will look for some metabolic derangement including diabetes  and thyroid  Plan: COLLECTION VENOUS BLOOD VENIPUNCTURE, TSH         (THYROID STIMULATING HORMONE)    (Z83.3) Family history of diabetes mellitus type II  Plan: COLLECTION VENOUS BLOOD VENIPUNCTURE,         HEMOGLOBIN A1C    (Z13.220) Screening for lipid disorders  Plan: COLLECTION VENOUS BLOOD VENIPUNCTURE,         CHOLESTEROL, HIGH DENSITY LIPOPROTEIN, LOW         DENSITY LIPOPROTEIN,DIRECT

## 2016-05-27 LAB — HEMOGLOBIN A1C
ESTIMATED AVERAGE GLUCOSE: 111 (ref 74–160)
HEMOGLOBIN A1C: 5.5 % (ref 4.0–5.6)

## 2016-06-09 ENCOUNTER — Telehealth (HOSPITAL_BASED_OUTPATIENT_CLINIC_OR_DEPARTMENT_OTHER): Payer: Self-pay | Admitting: Internal Medicine

## 2016-06-09 DIAGNOSIS — E039 Hypothyroidism, unspecified: Secondary | ICD-10-CM

## 2016-06-14 MED ORDER — LEVOTHYROXINE SODIUM 100 MCG PO TABS: 100 ug | tablet | Freq: Every morning | ORAL | 2 refills | 0 days | Status: DC

## 2016-06-14 MED ORDER — LEVOTHYROXINE SODIUM 100 MCG PO TABS
100.0000 ug | ORAL_TABLET | Freq: Every morning | ORAL | 2 refills | Status: DC
Start: 2016-06-14 — End: 2016-07-28

## 2016-06-14 NOTE — Progress Notes (Signed)
I have tried to reach this patient for about a week.  I was able to get through her today although she was driving at the time of my phone call.  I did tell her about the hypothyroid.  It is too late to repeat the blood test now and since her symptoms fit well with being hypothyroid I'm willing to try empiric treatment and then retest.  When I see her back we can look for secondary causes

## 2016-06-15 ENCOUNTER — Ambulatory Visit (HOSPITAL_BASED_OUTPATIENT_CLINIC_OR_DEPARTMENT_OTHER): Payer: No Typology Code available for payment source | Admitting: Rehabilitative and Restorative Service Providers"

## 2016-06-15 ENCOUNTER — Ambulatory Visit (HOSPITAL_BASED_OUTPATIENT_CLINIC_OR_DEPARTMENT_OTHER): Payer: Self-pay | Admitting: Rehabilitative and Restorative Service Providers"

## 2016-06-21 ENCOUNTER — Telehealth (HOSPITAL_BASED_OUTPATIENT_CLINIC_OR_DEPARTMENT_OTHER): Payer: Self-pay | Admitting: Registered Nurse

## 2016-06-21 NOTE — Progress Notes (Signed)
I am okay with her stopping the medicine but she should come back in for reevaluation.  I will see her Friday if she can come in

## 2016-06-21 NOTE — Telephone Encounter (Signed)
-----   Message from Ardyth Man sent at 06/21/2016  3:46 PM EDT -----  Regarding: rx issue   Contact: [phone]  Quinnly Carls [id number], 55 year old, female    Calls today:  Medication Questions  Patient Calling    What medication do you have questions about levothyroxine (SYNTHROID, LEVOTHROID) 100 MCG tablet  What is your question:states rx is making her weak and tired so is going to Stop taking it starting tomorrow.    Person calling on behalf of patient: Patient (self)    CALL BACK NUMBER:[phone]   Best time to call back: any time  Cell phone:   Other phone:    Patient's language of care: English    Patient does not need an interpreter.    Patient's PCP: Ilda Foil, MD

## 2016-06-21 NOTE — Progress Notes (Signed)
Outreach call to pt. Pt advise of message below.  Pt won't be able to come in this Friday and booked for Monday 9/25.  Appointment confirmed.  Will forward to provider for update.

## 2016-06-21 NOTE — Progress Notes (Signed)
Outreach call to pt. Pt reports starting Levothyroxine 9/11.  Pt reporting profound weakness and before she had less energy and very tired.  Pt reports walking slowly because if she walks to fast she will fall.  Pt denies fever/chills, wt loss or gain, LOC, sleeps ok, appetite unchanged  Pt denies any pain or discomfort; normal GU  Pt wants to stop taking medication  Will report findings to provider for review.

## 2016-06-28 ENCOUNTER — Ambulatory Visit (HOSPITAL_BASED_OUTPATIENT_CLINIC_OR_DEPARTMENT_OTHER): Payer: Self-pay | Admitting: Internal Medicine

## 2016-07-28 ENCOUNTER — Ambulatory Visit (HOSPITAL_BASED_OUTPATIENT_CLINIC_OR_DEPARTMENT_OTHER): Payer: No Typology Code available for payment source | Admitting: Internal Medicine

## 2016-07-28 ENCOUNTER — Encounter (HOSPITAL_BASED_OUTPATIENT_CLINIC_OR_DEPARTMENT_OTHER): Payer: Self-pay | Admitting: Internal Medicine

## 2016-07-28 VITALS — BP 92/64 | HR 89 | Temp 98.3°F | Wt 143.0 lb

## 2016-07-28 DIAGNOSIS — R5383 Other fatigue: Secondary | ICD-10-CM

## 2016-07-28 DIAGNOSIS — E039 Hypothyroidism, unspecified: Secondary | ICD-10-CM

## 2016-07-28 DIAGNOSIS — R0683 Snoring: Secondary | ICD-10-CM

## 2016-07-28 DIAGNOSIS — R5381 Other malaise: Secondary | ICD-10-CM

## 2016-07-28 LAB — FREE THYROXINE: FREE THYROXINE: 0.75 ng/dL — ABNORMAL LOW (ref 0.76–1.46)

## 2016-07-28 LAB — THYROID SCREEN TSH REFLEX FT4: THYROID SCREEN TSH REFLEX FT4: 6.44 u[IU]/mL — ABNORMAL HIGH (ref 0.358–3.740)

## 2016-07-28 NOTE — Progress Notes (Signed)
Loretta Martin is a 55 year old female who presents with a chief concern of fatigue, snoring, and plantar fasciitis.  When I last saw the patient did a blood test showing that she was hypothyroid.  We did try supplementing her thyroid medications but she stopped because she felt the medication made her feel more sleepy and tired.  She is now at baseline.  She is taking medications for depression and these are generally helpful.  I explained to her the way we interpret thyroid tests and the reason why I think she is hypothyroid.  The patient did not want to get a sleep study at home and was hoping to have it done in the hospital.  I did explain to her that the sleep study is a preliminary one at home first and the insurance company [name] will not pay for it to happen in the hospital unless that is abnormal.  I also explained what this might be contributing to her fatigue.  She seems more willing to engage in the study.  The patient does note ongoing burning in her left foot.  She also has pressure in her left ear that feels like it under water sometimes.  This developed a couple of weeks ago and subsequently resolved.  No current fevers or chills.  No nausea or vomiting.  No cough.  No runny nose.    OBJECTIVE:  BP 92/64 (Site: LA, Position: Sitting, Cuff Size: Reg)  Pulse 89  Temp 98.3 F (36.8 C) (Oral)  Wt 64.9 kg (143 lb)  LMP 11/02/2009  SpO2 97%  BMI 26.16 kg/m2  [name] appears well, alert and oriented x 3, pleasant and cooperative.  Logical lucid and well groomed  ENT normal, neck supple and free of adenopathy.  Thyroid is nonnodular or tender  Chest is clear to auscultation.  Heart sounds are normal, no murmurs, clicks, gallops or rubs.  Abdomen is soft, no tenderness, masses or organomegaly.    (E03.9) Acquired hypothyroidism  (primary encounter diagnosis)  Comment: We will check her thyroid tests although I do think this is a likely candidate for her symptoms of fatigue  Plan: COLLECTION VENOUS BLOOD  VENIPUNCTURE, THYROID         SCREEN TSH REFLEX FT4, FREE THYROXINE    (R53.81,  R53.83) Malaise and fatigue  Comment: As above sleep apnea does remain in the differential and she is going to consider sleep study at home    (R06.83) Snoring  Comment: As per last visit the patient is not highly concerned about this morning    This note was written using voice recognition software.  Please excuse any transcription errors

## 2016-08-02 ENCOUNTER — Ambulatory Visit (HOSPITAL_BASED_OUTPATIENT_CLINIC_OR_DEPARTMENT_OTHER): Payer: No Typology Code available for payment source | Admitting: Obstetrics & Gynecology

## 2016-08-02 VITALS — BP 90/60 | Wt 143.5 lb

## 2016-08-02 DIAGNOSIS — T192XXA Foreign body in vulva and vagina, initial encounter: Secondary | ICD-10-CM

## 2016-08-02 NOTE — Progress Notes (Signed)
S: 55 YO woman here because she is unable to remove her Estring by herself--too difficult. Is here for removal. Will get next one this week. Has no trouble placing it.    Doing well with this medication.  No other issues toady.    O: appears well  See vitals  Vag pink moist, estring in place    A: Estring needs to be removed  P: Estring removed without difficulty. Place new ring  When she can.  RTC 3 months.

## 2016-08-02 NOTE — Patient Instructions (Signed)
SB 3 months estring removal

## 2016-08-16 ENCOUNTER — Encounter (HOSPITAL_BASED_OUTPATIENT_CLINIC_OR_DEPARTMENT_OTHER): Payer: Self-pay

## 2016-08-23 ENCOUNTER — Telehealth (HOSPITAL_BASED_OUTPATIENT_CLINIC_OR_DEPARTMENT_OTHER): Payer: Self-pay | Admitting: Ambulatory Care

## 2016-08-23 NOTE — Progress Notes (Signed)
Spoke with pt's pharmacy as letter received from [place] re compliance with fluoxetine. Last picked up 04/28/16 #90- will verify if she needs refills/is taking.    Also- was to do a home sleep study with [facility]- will discuss if she has heard from that company to set this up

## 2016-08-24 NOTE — Telephone Encounter (Signed)
-----   Message from Darliss Cheney sent at 08/24/2016 11:32 AM EST -----  Regarding: Patient calling back   Contact: [phone]  Loretta Martin is a 55 year old old female.    Patient's PCP: Ilda Foil, MD    In case we get disconnected what is the best number to reach you at today:  Mobile Phone Telephone Information:  Mobile  [phone]    Person calling:  Patient (self)    How can I help you today:   Other: Patient called back returning nurses phone call.     Patient's language of care: [place]    Would you like an interpreter when the nurse calls you back?  NO

## 2016-08-24 NOTE — Progress Notes (Signed)
Spoke with pt. Takes fluoxetine every day    Reviewed sleep study- she has to call back to [facility] but is hesitant due to language barrier. Suggested ask them to speak slowly and explain English 2nd lang. Offered to have them call me should she feel she cannot complete the call.

## 2016-09-04 ENCOUNTER — Other Ambulatory Visit (HOSPITAL_BASED_OUTPATIENT_CLINIC_OR_DEPARTMENT_OTHER): Payer: Self-pay | Admitting: Internal Medicine

## 2016-09-04 DIAGNOSIS — R5381 Other malaise: Secondary | ICD-10-CM

## 2016-09-04 DIAGNOSIS — R5383 Other fatigue: Principal | ICD-10-CM

## 2016-09-06 ENCOUNTER — Emergency Department (HOSPITAL_BASED_OUTPATIENT_CLINIC_OR_DEPARTMENT_OTHER)
Admission: RE | Admit: 2016-09-06 | Disposition: A | Discharge status: Home / Self Care | Payer: Self-pay | Source: Emergency Department | Attending: Emergency Medicine | Admitting: Emergency Medicine

## 2016-09-06 ENCOUNTER — Ambulatory Visit (HOSPITAL_BASED_OUTPATIENT_CLINIC_OR_DEPARTMENT_OTHER): Payer: Self-pay | Admitting: Ambulatory Care

## 2016-09-06 LAB — XR CHEST 2 VIEWS

## 2016-09-06 MED ORDER — AZITHROMYCIN 250 MG PO TABS: 250 mg | tablet | ORAL | 0 refills | 0 days | Status: AC

## 2016-09-06 MED ORDER — AZITHROMYCIN 250 MG PO TABS
500.00 mg | ORAL_TABLET | Freq: Once | ORAL | Status: AC
Start: 2016-09-06 — End: 2016-09-06
  Administered 2016-09-06: 500 mg via ORAL
  Filled 2016-09-06: qty 2

## 2016-09-06 MED ORDER — AMOXICILLIN 500 MG PO CAPS
1000.00 mg | ORAL_CAPSULE | Freq: Three times a day (TID) | ORAL | 0 refills | Status: AC
Start: 2016-09-06 — End: 2016-09-11

## 2016-09-06 MED ORDER — AMOXICILLIN 500 MG PO CAPS: 1000 mg | capsule | Freq: Three times a day (TID) | ORAL | 0 refills | 0 days | Status: AC

## 2016-09-06 MED ORDER — AZITHROMYCIN 250 MG PO TABS
250.00 mg | ORAL_TABLET | ORAL | 0 refills | Status: AC
Start: 2016-09-07 — End: 2016-09-11

## 2016-09-06 MED ORDER — GUAIFENESIN 100 MG/5ML PO LIQD
200.00 mg | Freq: Once | ORAL | Status: AC
Start: 2016-09-06 — End: 2016-09-06
  Administered 2016-09-06: 200 mg via ORAL
  Filled 2016-09-06: qty 15

## 2016-09-06 MED ORDER — AMOXICILLIN 250 MG PO CAPS
1000.00 mg | ORAL_CAPSULE | Freq: Once | ORAL | Status: AC
Start: 2016-09-06 — End: 2016-09-06
  Administered 2016-09-06: 1000 mg via ORAL
  Filled 2016-09-06: qty 4

## 2016-09-06 NOTE — ED Triage Note (Signed)
Sob with cough x 1 week

## 2016-09-06 NOTE — Telephone Encounter (Signed)
Regarding: cough.  states wants to chek oxygen level.  ----- Message from Ardyth Man sent at 09/06/2016  3:46 PM EST -----  Loretta Martin [id number], 55 year old, female    Calls today:  Sick    What are the symptoms cough  How long has patient been sick? 1 wk   What has pt. tried at home pro air inhaler but not helping.  Person calling on behalf of patient: Patient (self)    CALL BACK NUMBER:[phone]  Best time to call back:  Cell phone:   Other phone:    Patient's language of care: English    Patient does not need an interpreter.    Patient's PCP: Ilda Foil, MD

## 2016-09-06 NOTE — Narrator Note (Signed)
Results of test discussed with pt

## 2016-09-06 NOTE — Telephone Encounter (Signed)
Spoke with pt. States using albuterol inhaler every 4 hours not helping her sob. Does not use any allergy meds other than occasional Flonase NS. Explained how Flonase works, needs to take daily.  She is concerned that she might have pneumonia and is not comfortable waiting until tomorrow to be seen. At this late hour, unable to accommodate today. Says she is going to ED

## 2016-09-06 NOTE — ED Provider Notes (Signed)
ED nursing record was reviewed. Prior records as available electronically through the Epic record were reviewed.    Patient was seen along with Dr. Lorretta Harp and management was discussed with them.    HPI:    This 55 year old female patient presents to the Emergency Department with chief complaint of cough, shortness of breath, subjective fever.  Onset of symptoms 1 week ago.  She reports a dry cough with associated chest congestion and shortness of breath.  Denies chest pain.  Reports subjective fevers earlier in the week.  No abdominal pain, nausea, vomiting.  She is not a smoker.  Denies history of asthma, but has been prescribed albuterol inhalers, which she has been using, with minimal relief. No lower extremity swelling.  Denies history of CAD, PE.  No recent travel or sick contacts.        ROS: Pertinent positives were reviewed as per the HPI above. All other systems were reviewed and are negative.      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      Comment: treated [facility] 11/97 CSF VDRL neg 9/00 RPR 9/00                1:2 repeat RPR yearly   08/31/2004: Nonspecific abnormal results of thyroid functi*      Comment: increased TSH 4.19 6/00; TSH nl 9/00; 05/04/99                - wnl  04/23/2009: Presbyopia  04/23/2009: PVD (posterior vitreous detachment)  08/31/2004: Trichomoniasis, unspecified      Comment: 10/97  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      Comment: +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 - had negative CXR pp at [facility]                referred to TB clinic  08/31/2004: Urinary tract infection, site not specified      Comment: 4/02, 6/02  Patient Active Problem List:     Nonspecific reaction to tuberculin skin test without active tuberculosis     Major depressive disorder, recurrent, in remission  ([place])     Mammographic microcalcification     IBS (irritable bowel syndrome)     DPH-CARE COORDINATION PROGRAM PARTICIPANT     Chronic low back pain     Presbyopia     PVD (posterior vitreous detachment)     Cysticercosis of central nervous system     Female stress incontinence     Family history of diabetes mellitus type II     Vitamin D deficiency     Cervical radiculopathy     Seasonal allergic rhinitis     BMI 26.0-26.9,adult     Varicose veins of lower extremity        Past Surgical History: Past Surgical History:  No date: BREAST BIOPSY & NEEDLE LOC WIR  No date: EXCISION EXCESSIVE SKIN & SUBQ TISSUE ABDOMEN      Comment: abdominoplasty in [place]  1998: LIG/TRNSXJ FLP TUBE ABDL/VAG APPR UNI/BI      Comment: Tubal Ligation  No date: OB ANTEPARTUM CARE CESAREAN DLVR & POSTPARTUM      Comment: c/section x 2  2004: REDUCTION MAMMAPLASTY  No date: TRURL RF FEMALE BLADDER NECK  STRS URIN INCONT      Medications:   No current facility-administered medications on file prior to encounter.   Current Outpatient Prescriptions on File Prior to Encounter:  PROAIR HFA 108 (90 Base) MCG/ACT inhaler Inhale 1 puff into the lungs every 4 (four) hours as needed for Wheezing Disp: 3 Inhaler Rfl: 3   fluticasone (FLONASE) 50 MCG/ACT nasal spray 2 sprays by Each Nostril route daily Disp: 1 Bottle Rfl: 11   estradiol (ESTRING) 2 MG vaginal ring Place 2 mg vaginally every 3 (three) months Disp: 1 each Rfl: 3   Cholecalciferol (VITAMIN D3) 400 UNITS tablet Take 1 tablet by mouth daily Disp: 30 tablet Rfl: 11   FLUoxetine (PROZAC) 20 MG capsule Take 2 capsules by mouth daily Disp: 180 capsule Rfl: 3   [DISCONTINUED] Estradiol (ESTROGEL) 0.75 MG/1.25 GM (0.06%) topical gel Place 1.25 g onto the skin every 3 (three) days. Disp: 50 g Rfl: 11   Multiple Vitamin (MULTIVITAMIN) TABS Take 1 tablet by mouth daily. Disp:  Rfl:    omega-3 acid ethyl esters (LOVAZA) 1 GM capsule Take 2 g by mouth 2 (two) times daily. Disp:  Rfl:          Social  History:   Social History  Social History   Marital status: Divorced  Spouse name: N/A    Years of education: N/A  Number of children: 3     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    Alcohol use Yes    Comment: sporadically    Drug use: No    Sexual activity: Yes    Partners: Male    Birth control/ protection: Surgical    Comment: men age 1 q 64, menorrhagia 2006, past history of syphillis and rx , 1st SA age 11, 60 lietime partners, separated again from her husband.     Other Topics Concern    Military Service No    Blood Transfusions No    Occupational Exposure No    Hobby Hazards No    Sleep Concern No    Stress Concern No    Weight Concern Yes    Special Diet No    Exercise No    Comment: not much    Seat Belt Yes     Social History Narrative        To [place] from [place] in 2001    Lives with 2 children, 1 is married    2 older children working    Works Aeronautical engineer    Remarried to husband,        6/16: 2 younger 66 and 83 YO children live with her and her husband, one other child married and living in [place]. 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  Daughter Christ Kick is starting at [employer] in Sept. Pt also has 2 older sons. Husb Jose. Denies DV.    Mood is fine, no psych issues.       FH:   Family History    Diabetes Mother     Hypertension Mother     Cancer - Lung FamHxNeg     Cancer - Colon FamHxNeg        Allergies:  Review of Patient's Allergies indicates:   Hydrocodone-acetami*  Nausea and Vomiting   Pcn [penicillins]           Comment:Got a urine infection after using      Physical Exam:  BP 116/82  Pulse 90  Temp 97 F  Resp 20  Wt 65.8 kg (145 lb)  LMP 11/02/2009  SpO2 97%  BMI 26.52 kg/m2    GENERAL: Well appearing, No acute distress, non-toxic.   SKIN:  Warm & dry; no erythema or rash.  HEAD:  NCAT.    Sclerae are anicteric and aninjected. Airway widely patent.   LUNGS:  Clear to auscultation bilaterally. No wheezes, rales, rhonchi.   HEART:  RRR.  No murmurs, rubs, or gallops.   ABDOMEN:  Soft, NTND.  No involuntary guarding or rebound. No hepatosplenomegaly. No masses  EXTREMITIES:  No obvious deformities.  Moving all 4 extremities. No cyanosis, no edema. 2+ pulses bilaterally.   NEUROLOGIC:  Alert and oriented x4; moves all extremities well; speaking in clear fluent sentences.   PSYCHIATRIC:  Appropriate for age, time of day, and situation           ED Course and Medical Decision-making:    This 55 year old female patient presents to the Emergency Department with chief complaint of cough, shortness of breath, subjective fever x1 week. Denies diagnosis of asthma, but has proair prescription which she uses when she is sick. Reports associated chest congestion; no chest pain.   Additional history as above.  On arrival, she is afebrile, vital signs are normal she is well-appearing.  On exam, lungs are clear, without wheezing or signs of focal consolidation. Additional exam findings as above.     Initial considerations include viral illness versus pneumonia.  Presentation is not suggestive of PE.  No wheezing on exam to suggest acute asthma exacerbation.  Plan for EKG, chest x-ray and treatment with Robitussin.      Medications Given in the ED:  Medications   guaiFENesin (ROBITUSSIN) 100 mg/5 mL liquid 200 mg (not administered)    Radiology Results:  CXR 2 views of the chest were obtained. Examination is compared to a    two-view chest of 05/12/2009. Examination shows evidence of 2    ill-defined nodular findings in the right mid to upper lung zone. Both    of these areas measure approximately 13 mm. The remaining lung fields    are clear. There is no acute pleural process. There is no cardiac    enlargement. The visualized bones and soft tissues are intact.      IMPRESSION: 2 somewhat ill-defined 13 mm nodular  densities on the    right. Would recommend chest CT scan for further evaluation.       Lab Results (abnormal results only):  Labs Reviewed - No data to display Other Results/Old Record review (e.g. ECG):  EKG: NSR @ 86 bpm, Axis normal ; Intervals normal; No evidence of ST-T wave changes, ischemia, QT prolongation or heart block.      Pertinent laboratory results and radiology results reviewed. Case discussed with Radiology in regards to nodular densities noted on CXR, did confirm that outpatient follow-up for CT non-contrast was reasonable.    Given infectious symptoms, CXR findings likely consistent with concommunity acquired pneumonia.  She was treated with a dose of amoxicillin and azithromycin.  Case was discussed with Dr. Patrice Paradise, who was taking call for her PCP, Dr. Nyoka Cowden.  He is aware of chest x-ray findings and will help facilitate outpatient follow-up for CT scan.  A  staff message was also sent to both of them.    On reassessment, patient remained well-appearing.  She was instructed on conservative care, discharged with a prescription for azithromycin and amoxicillin.  She is aware of chest x-ray findings and close follow-up was encouraged.    They express understanding and are in agreement with the treatment plan. Reasons to return to the ED were reviewed in detail, including worsening or new symptoms.     Condition: improved, stable  Disposition: home      Diagnosis/Diagnoses:  Cough  Multiple lung nodules on CT    Linard Millers, PA-C    This Emergency Department patient encounter note was created using voice-recognition software and in real time during the ED visit. Please excuse any typographical errors that have not been edited out.

## 2016-09-06 NOTE — Narrator Note (Addendum)
Patient Disposition    Patient education for diagnosis, medications, activity, diet and follow-up.  Patient left ED 6:55 PM.  Patient rep received written instructions.  Interpreter to provide instructions:n/a/english  Patient belongings with patient: yes    Have all existing LDAs been addressed?yes  Have all IV infusions been stopped? n/a  Discharged [id number]  Pain e/10

## 2016-09-06 NOTE — Discharge Instructions (Signed)
DIAGNOSIS & TREATMENT:  You were seen in a [facility] Emergency Department for cough, most likely due to Pneumonia. You were treated with first doses of antibiotics (amoxicillin and azithromycin).     TEST RESULTS:  Chest x-ray showed an abnormality that is possibly due to infection, but it also showed small nodules that require outpatient work-up with CT scan. Please make sure you follow up closely with your doctor once you are feeling improved, to make sure you have a normal follow up CT scan    FURTHER CARE:  Monitor your fever, stay hydrated. Use prescribed or over-the-counter medications for symptom relief making sure to monitor the amounts of tylenol your are consuming.     Fever Control:   Fever is as any temperature greater than or equal to 100.4 Fahrenheit.    Please use over the counter Tylenol (acetaminophen) and Ibuprofen (Advil) for fever control.  You may use Tylenol and ibuprofen concurrently for fever control.    Please make sure you monitor your Tylenol use. Tylenol is the same as acetaminophen. For adults, any amount over 4 grams (4,000 mg) in any 24 hour period can cause liver damage, possibly irreversible.   Please make sure to check all the medications you are taking including over-the counter (OTC) medications. Many multi-symptom medications have acetaminophen in them so please check carefully.    For an adult, do not take more than 3200 mg of Ibuprofen in any 24 hour period.   Please contact your doctor or pharmacist for any additional questions.    Hydration:  Please drink plenty of water as being sick can cause you to become very dehydrated quickly making you feel worse including severe headaches. We recommend that you drink plenty of water or juice. You should target to have your urine light yellow to clear. Use sports drinks like Gatorade cautiously as some have artificial sweeteners that can cause diarrhea if you drink too much of them. Avoid drinks with caffeine like  soda, coffee, and tea as caffeine will dehydrate you.     NEW MEDICATIONS:  Amoxicillin & Azithromycin : these are both antibiotics used to treat this infection, please finish entire course of both of them.       WHEN SHOULD YOU BE SEEN NEXT?  Please call your doctor as soon as possible and schedule an appointment for for re-evaluation. If you do not have a primary care doctor or would like to transfer your primary care to [facility], please call [phone] to set one up an appointment.    WHEN SHOULD YOU RETURN TO THE ED?  Please return to the emergency room if develop a fever that will not resolve with tylenol or ibuprofen, vomiting that prevents you from keeping down fluids, you have difficulty breathing, are unable to swallow,dizziness, you pass out,  begin drooling, your symptoms worsen, you get new symptoms or you are unable to schedule follow up care.

## 2016-09-08 LAB — EKG

## 2016-09-22 ENCOUNTER — Ambulatory Visit (HOSPITAL_BASED_OUTPATIENT_CLINIC_OR_DEPARTMENT_OTHER): Payer: No Typology Code available for payment source | Admitting: Internal Medicine

## 2016-10-08 ENCOUNTER — Telehealth (HOSPITAL_BASED_OUTPATIENT_CLINIC_OR_DEPARTMENT_OTHER): Payer: Self-pay | Admitting: Internal Medicine

## 2016-10-08 NOTE — Progress Notes (Signed)
This patient missed her most recent appointment with me and does need follow-up because of the nodules on her chest seen in the emergency department visit.  I did try calling her but was unable to get through.  I will continue to try

## 2016-11-01 ENCOUNTER — Ambulatory Visit (HOSPITAL_BASED_OUTPATIENT_CLINIC_OR_DEPARTMENT_OTHER): Payer: No Typology Code available for payment source | Admitting: Obstetrics & Gynecology

## 2016-11-01 VITALS — BP 92/60 | Wt 144.0 lb

## 2016-11-01 DIAGNOSIS — Z9289 Personal history of other medical treatment: Secondary | ICD-10-CM

## 2016-11-01 DIAGNOSIS — T192XXA Foreign body in vulva and vagina, initial encounter: Secondary | ICD-10-CM

## 2016-11-01 DIAGNOSIS — R7989 Other specified abnormal findings of blood chemistry: Secondary | ICD-10-CM

## 2016-11-01 LAB — THYROID SCREEN TSH REFLEX FT4: THYROID SCREEN TSH REFLEX FT4: 3.66 u[IU]/mL (ref 0.358–3.740)

## 2016-11-01 NOTE — Patient Instructions (Signed)
SB 3 months ring removal

## 2016-11-01 NOTE — Progress Notes (Signed)
S: 56 YO woman here for removal of vaginal estrogen ring. She is unable to do this herself.    Also per PCP she needs TSH drawn today, hx borderline value 10/17. Also asking if due for mammo this spring.    No breast problems  Due for annual 7/18    O: appears well  Vag pink moist  Estring removed easily  A: Removal of Estrogen ring  Needs TSH drawn    P: Pt to pick up Estring refill, sched in 3 months with me  TSH ordered, mammo ordered. Reita Chard, APRN

## 2016-11-02 ENCOUNTER — Encounter (HOSPITAL_BASED_OUTPATIENT_CLINIC_OR_DEPARTMENT_OTHER): Payer: Self-pay | Admitting: Obstetrics & Gynecology

## 2016-11-03 ENCOUNTER — Ambulatory Visit (HOSPITAL_BASED_OUTPATIENT_CLINIC_OR_DEPARTMENT_OTHER): Payer: No Typology Code available for payment source | Admitting: Obstetrics & Gynecology

## 2016-12-28 ENCOUNTER — Ambulatory Visit (HOSPITAL_BASED_OUTPATIENT_CLINIC_OR_DEPARTMENT_OTHER): Payer: PRIVATE HEALTH INSURANCE | Admitting: Internal Medicine

## 2016-12-28 ENCOUNTER — Encounter (HOSPITAL_BASED_OUTPATIENT_CLINIC_OR_DEPARTMENT_OTHER): Payer: Self-pay | Admitting: Internal Medicine

## 2016-12-28 VITALS — BP 94/78 | HR 85 | Temp 97.3°F | Wt 141.0 lb

## 2016-12-28 DIAGNOSIS — M79641 Pain in right hand: Secondary | ICD-10-CM

## 2016-12-28 LAB — XR WRIST RIGHT MINIMUM 3 VIEWS

## 2016-12-28 MED ORDER — ACETAMINOPHEN 500 MG PO TABS
500.0000 mg | ORAL_TABLET | Freq: Four times a day (QID) | ORAL | 0 refills | Status: DC | PRN
Start: 2016-12-28 — End: 2017-01-24

## 2016-12-28 MED ORDER — ACETAMINOPHEN 500 MG PO TABS: 500 mg | tablet | Freq: Four times a day (QID) | ORAL | 0 refills | 0 days | Status: DC | PRN

## 2016-12-28 NOTE — Progress Notes (Signed)
Loretta Martin is a 56 year old female    Patient presents with:  Wrist Pain: Right X Month      Patient Active Problem List:     Nonspecific reaction to tuberculin skin test without active tuberculosis     Major depressive disorder, recurrent, in remission (HCC)     Mammographic microcalcification     IBS (irritable bowel syndrome)     DPH-CARE COORDINATION PROGRAM PARTICIPANT     Chronic low back pain     Presbyopia     PVD (posterior vitreous detachment)     Cysticercosis of central nervous system     Female stress incontinence     Family history of diabetes mellitus type II     Vitamin D deficiency     Cervical radiculopathy     Seasonal allergic rhinitis     BMI 26.0-26.9,adult     Varicose veins of lower extremity    Right wrist/hand pain almost a month ago.  Points to ventral aspect of right wrist.  She is right handed. Works as Engineer, building services.  No particular trauma to area.  No edema or erythema.  Pain is daily.  Worse when works with right hand at her job.  She notices pulsating pain in the area.  Better by nothing.  Not improving since it started.  Never wakes her up at nights.  No paresthesias.           Physical Exam   Constitutional: She appears well-developed. No distress.   BP 94/78  Pulse 85  Temp 97.3 F (36.3 C) (Oral)  Wt 64 kg (141 lb)  LMP 11/02/2009  SpO2 95%  BMI 25.79 kg/m2     Musculoskeletal:        Right wrist: She exhibits normal range of motion, no swelling and no deformity.        Right hand: She exhibits tenderness (right: + tenderness thumb base area, ventral aspect of wrist.). She exhibits normal range of motion and no swelling. Normal sensation noted. Normal strength noted.   Negative tinel's.       ASSESSMENT/PLAN:  (M79.641) Right hand pain  (primary encounter diagnosis)  Comment: OA vs repetitive injury from her job.    Plan: XR WRIST RIGHT MINIMUM 3 VW, acetaminophen         (TYLENOL) 500 MG tablet        Rest wrist and thumb.  Ice to area.   Return instructions provided.       We  discussed the patient's current medications. The patient expressed understanding and no barriers to adherence were identified.

## 2016-12-29 NOTE — Progress Notes (Signed)
Called and left detailed VM: normal xray, no fractures, no signs of OA.  Recommend trial of rest, wrap to area, ice, tylenol prn and follow up if not improving.

## 2017-01-11 ENCOUNTER — Other Ambulatory Visit (HOSPITAL_BASED_OUTPATIENT_CLINIC_OR_DEPARTMENT_OTHER): Payer: Self-pay | Admitting: Internal Medicine

## 2017-01-11 NOTE — Progress Notes (Unsigned)
PER Pharmacy, Loretta Martin is a 56 year old female has requested a refill of fluoxetine 20mg .      Last Office Visit: 03/27/218 with Romero-Pantazopoulos, T  Last Physical Exam: 04/26/2016      Other Med Adult:  Most Recent BP Reading(s)  12/28/16 : 94/78          Cholesterol (mg/dL)   Date Value   05/26/2016 221   ----------    LOW DENSITY LIPOPROTEIN DIRECT (mg/dL)   Date Value   05/26/2016 144   ----------    HIGH DENSITY LIPOPROTEIN (mg/dL)   Date Value   05/26/2016 50   ----------    TRIGLYCERIDES (mg/dl)   Date Value   08/10/2005 79   ----------        THYROID SCREEN TSH REFLEX FT4 (uIU/mL)   Date Value   11/01/2016 3.660   ----------        TSH (THYROID STIM HORMONE) (uIU/mL)   Date Value   05/26/2016 8.300 (H)   ----------      HEMOGLOBIN A1C (%)   Date Value   05/26/2016 5.5   ----------    No results found for: POCA1C        INR (no units)   Date Value   05/12/2009 1.0 (L)   ----------      SODIUM (mmol/L)   Date Value   05/12/2009 138   ----------      POTASSIUM (mmol/L)   Date Value   05/12/2009 4.8   ----------          CREATININE (mg/dl)   Date Value   05/12/2009 0.5   ----------    Documented patient preferred pharmacies:    [facility] [place], [place]  Phone: [phone] Fax: [phone]

## 2017-01-12 NOTE — Telephone Encounter (Signed)
Patient using daily SSRI for MDD. Refilled 3 month supply. Patient scheduled to see new PCP next month.

## 2017-01-24 ENCOUNTER — Ambulatory Visit (HOSPITAL_BASED_OUTPATIENT_CLINIC_OR_DEPARTMENT_OTHER): Payer: PRIVATE HEALTH INSURANCE | Admitting: Internal Medicine

## 2017-01-24 ENCOUNTER — Encounter (HOSPITAL_BASED_OUTPATIENT_CLINIC_OR_DEPARTMENT_OTHER): Payer: Self-pay | Admitting: Internal Medicine

## 2017-01-24 VITALS — BP 128/74 | HR 73 | Temp 98.1°F | Wt 138.0 lb

## 2017-01-24 DIAGNOSIS — F3340 Major depressive disorder, recurrent, in remission, unspecified: Secondary | ICD-10-CM

## 2017-01-24 DIAGNOSIS — Z0000 Encounter for general adult medical examination without abnormal findings: Secondary | ICD-10-CM

## 2017-01-24 DIAGNOSIS — J45909 Unspecified asthma, uncomplicated: Secondary | ICD-10-CM | POA: Insufficient documentation

## 2017-01-24 NOTE — Progress Notes (Signed)
Cc: depression & PE    Depression  She denies anxiety, sadness, insomnia, lack of interest, poor concentration, poor appetite, agitation, suicidal ideation or suicide attempt   Using her fluoxetine 20 mg daily, no side effects    Review of symptoms:     No visual changes. No fevers or unexplained weight loss.  No sore throat or ear ache.  No prolonged cough. No dyspnea or chest pain on exertion.  No abdominal pain or change in bowel habits.  No vaginal discharge or dysuria.  No new rashes or skin changes.  No pain or masses in breasts. No paresthesias or unusual headaches. No sadness or anxiety that interferes with day-to-day activities. No hot flushes. No enlarged nodes.  No new itching, sneezing or wheezing.  No new or unusual musculoskeletal symptoms.     Patient Active Problem List    Reactive airway disease         Date Noted: 01/24/2017            Uses inhaler when she develops a viral illness      BMI 26.0-26.9,adult         Date Noted: 09/08/2015      Seasonal allergic rhinitis         Date Noted: 01/27/2015      Varicose veins of lower extremity         Date Noted: 07/09/2013            Overview:             Varicose veins of lower extremity      Cervical radiculopathy         Date Noted: 03/17/2012            6/10/131. Mild degenerative changes of the            cervical spine extending from C3-C4 to C5-C6            levels with a disc space narrowing and marginal            osteophytes. 2. Mild reversal of the normal            cervical lordosis which may be             due to             positioning or muscle spasm.                               Vitamin D deficiency         Date Noted: 01/04/2011      Family history of diabetes mellitus type II         Date Noted: 12/28/2010      Female stress incontinence         Date Noted: 02/27/2010      Cysticercosis of central nervous system         Date Noted: 12/29/2009            Found incidentally on CT scan.  No tx needed            without  active lesions      Presbyopia         Date Noted: 04/23/2009      PVD (posterior vitreous detachment)         Date Noted: 04/23/2009      Chronic low back pain  Date Noted: 03/07/2008      DPH-CARE COORDINATION PROGRAM PARTICIPANT         Date Noted: 01/12/2008            Enrolled on 4.6.9 with Lonia Skinner, LICSW            Non smoker.            Chart review 02/14/08            Referral to Risk Reduction/Health Educator for            elevated bmi and random bsReferral to Primary Care            Provider due for yearly mammo,pap/breast exam-sees            gyn,also random bs 144,? Check fasting.has f/u            booked with provider for this month.to patient            navigator to follow            Referral to Registered Dietician to be detemined            Added to rre and pn lists and chart forwarded.                                           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 - had negative CXR pp at [facility] referred            to TB clinic      Major depressive disorder, recurrent, in remission [facility])         Date Noted: 08/31/2004            10/00 to Psych on Paxil, self d/c'd;            9/00 head CT: scattered punctate calcifications                        Wants to start medication again because she feels            anxious and sad       Loretta Martin, Avis   Home Medication Instructions [id number]    Printed on:01/24/17 0911   Medication Information                      Cholecalciferol (VITAMIN D3) 400 UNITS tablet  Take 1 tablet by mouth daily             estradiol (ESTRING) 2 MG vaginal ring  Place 2 mg vaginally every 3 (three) months             FLUoxetine (PROZAC) 20 MG capsule  Take 2 capsules by mouth daily             fluticasone (FLONASE) 50  MCG/ACT nasal spray  2 sprays by Each Nostril route daily  Multiple Vitamin (MULTIVITAMIN) TABS  Take 1 tablet by mouth daily.             omega-3 acid ethyl esters (LOVAZA) 1 GM capsule  Take 2 g by mouth 2 (two) times daily.             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      Comment: treated [facility] 11/97 CSF VDRL neg 9/00 RPR 9/00                1:2 repeat RPR yearly   08/31/2004: Nonspecific abnormal results of thyroid functi*      Comment: increased TSH 4.19 6/00; TSH nl 9/00; 05/04/99                - wnl  04/23/2009: Presbyopia  04/23/2009: PVD (posterior vitreous detachment)  08/31/2004: Trichomoniasis, unspecified      Comment: 10/97  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      Comment: +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 - had negative CXR pp at [facility]                referred to TB clinic  08/31/2004: Urinary tract infection, site not specified      Comment: 4/02, 6/02  Past Surgical History:  No date: BREAST BIOPSY & NEEDLE LOC WIR  No date: EXCISION EXCESSIVE SKIN & SUBQ TISSUE ABDOMEN      Comment: abdominoplasty in [place]  1998: LIG/TRNSXJ FLP TUBE ABDL/VAG APPR UNI/BI      Comment: Tubal Ligation  No date: OB ANTEPARTUM CARE CESAREAN DLVR & POSTPARTUM      Comment: c/section x 2  2004: REDUCTION MAMMAPLASTY  No date: TRURL RF FEMALE BLADDER NECK STRS URIN INCONT  Social History    Marital status: Divorced            Spouse name:                       Years of education:                 Number of children: 3             Occupational  History  Occupation          Acupuncturist              Social History Main Topics    Smoking status: Former Smoker  Packs/day: 0.00      Years: 0.00           Start date: 10/05/2003       Quit date: 08/10/2004    Smokeless tobacco: Never Used                        Comment: smoked only for 6 months.    Alcohol use: Yes                Comment: minimal    Drug use: No              Sexual activity: Yes               Partners with: Female       Birth control/protection: Surgical       Comment: men age 59 q 31, menorrhagia 2006, past                history of syphillis and rx , 1st SA age                 14, 1 lietime partners, separated again                 from her husband.    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  Daughter Christ Kick is starting at [employer] in Sept. Pt also has 2 older sons. Husb Jose. Denies DV.    Mood is fine, no psych issues.        Family History    Diabetes Mother     Hypertension Mother     BP 128/74 (Site: LA, Position: Sitting, Cuff Size: Reg)  Pulse 73  Temp 98.1 F (36.7 C) (Oral)  Wt 62.6  kg (138 lb)  LMP 11/02/2009  BMI 25.24 kg/m2  General:Patient appears well, alert and oriented x 3, pleasant, cooperative.   Eyes: anicteric conjunctiva, PERLA  bilaterally.   Ears: tm's wnl bilaterally without erythema or exudate.   Oropharynx: no erythema or exudate.   Neck supple and free of adenopathy, or masses. No thyromegaly.  Lymph: no enlargement of anterior cervical, posterior cervical or supraclavicular lymph nodes   Cardiovascular: regular rate & rhythm, no murmurs, gallops or rubs appreciated.   Chest: clear to auscultation bilaterally, no crackles, rhonchi or wheezes.   Abdomen is soft, no tenderness, masses or organomegaly.  Extremities: no clubbing, cyanosis or edema.    Skin is normal without suspicious lesions noted.  Mental status exam: Normal thought content, speech, affect, mood and dress are noted.  Musculoskeletal exam: within normal limits without joint swelling or deformities  Screening neurological exam is normal without focal findings.     ASSESSMENT & PLAN:  (Z00.00) Routine general medical examination at a health care facility  (primary encounter diagnosis)  Comment: We discussed the pros and cons of screening mammo -- we discussed that there are real benefits in terms of decrease risk of dying from breast cancer but there's a cost which is many procedures & treatments, many of which might not be necessary. So, if she wishes to do everything to decrease her risk of dying from breast cancer -- then I would recommend a mammogram. But she should not feel guilty if she decided not to pursue mammograms at this time.  We also discussed the option of performing mammograms q49yr or q14yr and that she can change her mind at any time in the future.  After detailed discussion & answering all her questions, she decided to check it as it's been 2 years.  Up to date on cervical & colon cancer screening       (F33.40) Major depressive disorder, recurrent, in remission ([facility])  Comment: provided supportive  counseling, continuing fluoxetine for now, will continue to explore this        The patient was ready to learn and no apparent learning or adherence barriers were identified. I explained the diagnosis and treatment plan, and the patient expressed understanding of the content. I attempted to answer any questions regarding the diagnosis and the proposed treatment.    Possible side effects of the prescribed medication was explained. We discussed the patients current medications.  We discussed the importance of medication compliance. The patient expressed understanding and no barriers to adherence were identified.    follow-up will be scheduled for 3 months from now   she has been advised to call or return with any worsening or new problems

## 2017-01-31 ENCOUNTER — Ambulatory Visit (HOSPITAL_BASED_OUTPATIENT_CLINIC_OR_DEPARTMENT_OTHER): Payer: No Typology Code available for payment source | Admitting: Obstetrics & Gynecology

## 2017-01-31 ENCOUNTER — Ambulatory Visit (HOSPITAL_BASED_OUTPATIENT_CLINIC_OR_DEPARTMENT_OTHER): Payer: PRIVATE HEALTH INSURANCE | Admitting: Obstetrics & Gynecology

## 2017-02-01 ENCOUNTER — Ambulatory Visit (HOSPITAL_BASED_OUTPATIENT_CLINIC_OR_DEPARTMENT_OTHER): Payer: Self-pay | Admitting: Obstetrics & Gynecology

## 2017-02-01 DIAGNOSIS — Z9289 Personal history of other medical treatment: Secondary | ICD-10-CM

## 2017-02-07 LAB — MA SCREENING MAMMO BILATERAL DIGITAL WITH DBT & CAD

## 2017-02-07 NOTE — Addendum Note (Signed)
Addended by: Rowan Blase on: 02/07/2017 08:52 AM     Modules accepted: Orders

## 2017-02-22 ENCOUNTER — Other Ambulatory Visit (HOSPITAL_BASED_OUTPATIENT_CLINIC_OR_DEPARTMENT_OTHER): Payer: Self-pay | Admitting: Internal Medicine

## 2017-02-22 ENCOUNTER — Ambulatory Visit (HOSPITAL_BASED_OUTPATIENT_CLINIC_OR_DEPARTMENT_OTHER): Payer: PRIVATE HEALTH INSURANCE | Admitting: Obstetrics & Gynecology

## 2017-02-22 VITALS — BP 109/74 | Wt 142.5 lb

## 2017-02-22 DIAGNOSIS — N393 Stress incontinence (female) (male): Secondary | ICD-10-CM

## 2017-02-22 DIAGNOSIS — N952 Postmenopausal atrophic vaginitis: Secondary | ICD-10-CM

## 2017-02-22 MED ORDER — ESTRADIOL 2 MG VA RING
2.0000 mg | VAGINAL_RING | VAGINAL | 3 refills | Status: AC
Start: 2017-02-22 — End: 2018-02-23

## 2017-02-22 MED ORDER — ESTRADIOL 2 MG VA RING: 2 mg | each | VAGINAL | 3 refills | 0 days | Status: AC

## 2017-02-22 NOTE — Patient Instructions (Signed)
3 month ring removal with SB

## 2017-02-22 NOTE — Progress Notes (Signed)
PER Pharmacy, Loretta Martin is a 56 year old female has requested a refill of vitamin d.      Last Office Visit: 01/24/17 with pcp  Last Physical Exam: 01/24/17      Other Med Adult:  Most Recent BP Reading(s)  02/22/17 : 109/74          Cholesterol (mg/dL)   Date Value   05/26/2016 221   ----------    LOW DENSITY LIPOPROTEIN DIRECT (mg/dL)   Date Value   05/26/2016 144   ----------    HIGH DENSITY LIPOPROTEIN (mg/dL)   Date Value   05/26/2016 50   ----------    TRIGLYCERIDES (mg/dl)   Date Value   08/10/2005 79   ----------        THYROID SCREEN TSH REFLEX FT4 (uIU/mL)   Date Value   11/01/2016 3.660   ----------        TSH (THYROID STIM HORMONE) (uIU/mL)   Date Value   05/26/2016 8.300 (H)   ----------      HEMOGLOBIN A1C (%)   Date Value   05/26/2016 5.5   ----------    No results found for: POCA1C        INR (no units)   Date Value   05/12/2009 1.0 (L)   ----------      SODIUM (mmol/L)   Date Value   05/12/2009 138   ----------      POTASSIUM (mmol/L)   Date Value   05/12/2009 4.8   ----------          CREATININE (mg/dl)   Date Value   05/12/2009 0.5   ----------    Documented patient preferred pharmacies:    [facility] [place] [facility] [place], [place]  Phone: [phone] Fax: [phone]

## 2017-02-22 NOTE — Progress Notes (Signed)
PER Pharmacy, Loretta Martin is a 56 year old female has requested a refill of vitamin D 400.      Last Office Visit: 01/24/2017 with Rosalie Doctor  Last Physical Exam: 01/24/2017      Other Med Adult:  Most Recent BP Reading(s)  02/22/17 : 109/74          Cholesterol (mg/dL)   Date Value   05/26/2016 221   ----------    LOW DENSITY LIPOPROTEIN DIRECT (mg/dL)   Date Value   05/26/2016 144   ----------    HIGH DENSITY LIPOPROTEIN (mg/dL)   Date Value   05/26/2016 50   ----------    TRIGLYCERIDES (mg/dl)   Date Value   08/10/2005 79   ----------        THYROID SCREEN TSH REFLEX FT4 (uIU/mL)   Date Value   11/01/2016 3.660   ----------        TSH (THYROID STIM HORMONE) (uIU/mL)   Date Value   05/26/2016 8.300 (H)   ----------      HEMOGLOBIN A1C (%)   Date Value   05/26/2016 5.5   ----------    No results found for: POCA1C        INR (no units)   Date Value   05/12/2009 1.0 (L)   ----------      SODIUM (mmol/L)   Date Value   05/12/2009 138   ----------      POTASSIUM (mmol/L)   Date Value   05/12/2009 4.8   ----------          CREATININE (mg/dl)   Date Value   05/12/2009 0.5   ----------    Documented patient preferred pharmacies:    [facility] - [place], [place]  Phone: [phone] Fax: [phone]

## 2017-02-22 NOTE — Progress Notes (Signed)
S: 56 YO woman here for removal of her estring. She is unable to do this herself    Doing well with her vag atrophy/dryness with this estrogen replacement method    Hx SUI--pt didn't follow up with PT referral made 7/17, but still has this problem    Not due for pap  Had mammo last month    O: appears well  Vag pink moist  A: estring removal needed  P: Estring removed easily  Follow up 3 months  Re-referred to pelvic floor PT.

## 2017-04-25 ENCOUNTER — Ambulatory Visit (HOSPITAL_BASED_OUTPATIENT_CLINIC_OR_DEPARTMENT_OTHER): Payer: PRIVATE HEALTH INSURANCE | Admitting: Internal Medicine

## 2017-05-13 ENCOUNTER — Ambulatory Visit (HOSPITAL_BASED_OUTPATIENT_CLINIC_OR_DEPARTMENT_OTHER): Payer: PRIVATE HEALTH INSURANCE | Admitting: Internal Medicine

## 2017-05-13 ENCOUNTER — Encounter (HOSPITAL_BASED_OUTPATIENT_CLINIC_OR_DEPARTMENT_OTHER): Payer: Self-pay | Admitting: Internal Medicine

## 2017-05-13 VITALS — BP 116/64 | HR 96 | Temp 98.0°F | Wt 142.0 lb

## 2017-05-13 DIAGNOSIS — F3340 Major depressive disorder, recurrent, in remission, unspecified: Secondary | ICD-10-CM

## 2017-05-13 DIAGNOSIS — J302 Other seasonal allergic rhinitis: Secondary | ICD-10-CM

## 2017-05-13 DIAGNOSIS — E038 Other specified hypothyroidism: Secondary | ICD-10-CM

## 2017-05-13 DIAGNOSIS — J4520 Mild intermittent asthma, uncomplicated: Secondary | ICD-10-CM

## 2017-05-13 DIAGNOSIS — E039 Hypothyroidism, unspecified: Principal | ICD-10-CM

## 2017-05-13 DIAGNOSIS — E785 Hyperlipidemia, unspecified: Secondary | ICD-10-CM

## 2017-05-13 NOTE — Progress Notes (Signed)
Cc: multiple issues    Seasonal allergies  She notes that her nasal corticosteroids has been working well, using regularly   She notes that she only needs to use her albuterol inhaler when she has a viral infection & these are rare    High chol  She was told in [place] that her chol was high  On review her LDL is 154, HDL 53, total 232  She has no family hx of CAD  Smoked for one year    Thyroid testing  No symptoms of hypo or hyperthyroidism: no decreased or increased weight, no feeling cold/chilly or excessively warm, no diarrhea or constipation, no undue sweatiness, anxiety or palpitations.   She notes that she was told her thyroid tests were not normal in [place], brings results  TSH slightly elevated 6.5, free T4 wnl    Depression  She notes that she's doing well  Please see PHQ-9 for additional details.   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  Daughter Christ Kick is starting at [employer] in Sept. Pt also has 2 older sons. Husb Jose. Denies DV.    Mood is fine, no psych issues.           Current Outpatient Prescriptions on File Prior to Visit:  estradiol (ESTRING) 2 MG vaginal ring Place 2 mg vaginally every 3 (three) months Disp: 1 each Rfl: 3   Cholecalciferol (VITAMIN D3) 400 units tablet take 1 tablet by mouth once daily Disp: 30 tablet Rfl: 11   FLUoxetine (PROZAC) 20 MG capsule Take 2 capsules by mouth daily Disp: 180 capsule Rfl: 3   PROAIR HFA 108 (90 Base) MCG/ACT inhaler Inhale 1 puff into the  lungs every 4 (four) hours as needed for Wheezing Disp: 3 Inhaler Rfl: 3   fluticasone (FLONASE) 50 MCG/ACT nasal spray 2 sprays by Each Nostril route daily Disp: 1 Bottle Rfl: 11   [DISCONTINUED] Estradiol (ESTROGEL) 0.75 MG/1.25 GM (0.06%) topical gel Place 1.25 g onto the skin every 3 (three) days. Disp: 50 g Rfl: 11   Multiple Vitamin (MULTIVITAMIN) TABS Take 1 tablet by mouth daily. Disp:  Rfl:    omega-3 acid ethyl esters (LOVAZA) 1 GM capsule Take 2 g by mouth 2 (two) times daily. Disp:  Rfl:      No current facility-administered medications on file prior to visit.      BP 116/64 (Site: RA, Position: Sitting, Cuff Size: Reg)  Pulse 96  Temp 98 F (36.7 C) (Oral)  Wt 64.4 kg (142 lb)  LMP 11/02/2009  SpO2 95%  BMI 25.97 kg/m2  Heart: S1 and S2 normal, no murmurs, clicks, gallops or rubs. Regular rate and rhythm.   Lungs:  clear; no wheezes, rhonchi or rales.  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  (primary encounter diagnosis)  Comment: we discussed in detail that this is not a current problem, altho' we will track yearly & she might require thyroid hormone replacement in the future she does not need that now  Plan: THYROID SCREEN TSH REFLEX FT4, COLLECTION         VENOUS BLOOD VENIPUNCTURE            (F33.40) Major depressive disorder, recurrent, in remission ([place])  Comment: provided supportive counseling       (J30.2) Seasonal allergic rhinitis, unspecified chronicity, unspecified trigger  Comment: well controlled continue current regimen       (J45.20) Mild intermittent reactive airway disease without complication  Comment: reviewed need for controller inhaled corticosteroids or PFTs - not indicated at present      (E78.5) Hyperlipidemia LDL goal <160  Comment: we  discussed given her risk factors that her chol altho' above what the lab was reading as normal in [place], does not require meds at present, lifestyle changes emphasized      The patient was ready to learn and no apparent learning or adherence barriers were identified. I explained the diagnosis and treatment plan, and the patient expressed understanding of the content. I attempted to answer any questions regarding the diagnosis and the proposed treatment.     We discussed the patients current medications.  We discussed the importance of medication compliance. The patient expressed understanding and no barriers to adherence were identified.    follow-up will be scheduled for 1 year from now  she has been advised to call or return with any worsening or new problems

## 2017-05-18 ENCOUNTER — Encounter (HOSPITAL_BASED_OUTPATIENT_CLINIC_OR_DEPARTMENT_OTHER): Payer: Self-pay | Admitting: Rehabilitative and Restorative Service Providers"

## 2017-05-30 ENCOUNTER — Ambulatory Visit (HOSPITAL_BASED_OUTPATIENT_CLINIC_OR_DEPARTMENT_OTHER): Payer: PRIVATE HEALTH INSURANCE | Admitting: Obstetrics & Gynecology

## 2017-05-30 VITALS — BP 102/76 | Wt 145.0 lb

## 2017-05-30 DIAGNOSIS — T192XXA Foreign body in vulva and vagina, initial encounter: Secondary | ICD-10-CM

## 2017-05-30 NOTE — Progress Notes (Signed)
S: 56 YO woman here for removal of her Estring.    Pt is happy with the Estring for vag dryness. Is just unable to remove it herself at end of the 3 months.  It helps and she has no sx since starting it several years ago.    No issues    O: Appears well  Vag pink moist, estring in place  See vitals  A: Estring removal by provider  P: Estring removed without problem  Pt has refills  Sched removal 3 months.

## 2017-05-30 NOTE — Patient Instructions (Signed)
SB 3 months ring removal

## 2017-08-30 ENCOUNTER — Encounter (HOSPITAL_BASED_OUTPATIENT_CLINIC_OR_DEPARTMENT_OTHER): Payer: Self-pay | Admitting: Obstetrics & Gynecology

## 2017-08-30 ENCOUNTER — Ambulatory Visit (HOSPITAL_BASED_OUTPATIENT_CLINIC_OR_DEPARTMENT_OTHER): Payer: PRIVATE HEALTH INSURANCE | Admitting: Obstetrics & Gynecology

## 2017-08-30 VITALS — BP 115/74 | Wt 140.0 lb

## 2017-08-30 DIAGNOSIS — J301 Allergic rhinitis due to pollen: Secondary | ICD-10-CM

## 2017-08-30 DIAGNOSIS — T192XXD Foreign body in vulva and vagina, subsequent encounter: Secondary | ICD-10-CM

## 2017-08-30 MED ORDER — PROAIR HFA 108 (90 BASE) MCG/ACT IN AERS
1.0000 | INHALATION_SPRAY | RESPIRATORY_TRACT | 0 refills | Status: DC | PRN
Start: 2017-08-30 — End: 2020-01-04

## 2017-08-30 MED ORDER — PROAIR HFA 108 (90 BASE) MCG/ACT IN AERS: 1 | Inhaler | RESPIRATORY_TRACT | 0 refills | 0 days | Status: AC | PRN

## 2017-08-30 NOTE — Patient Instructions (Addendum)
Loretta Martin , nurse practitioner at [facility]  [phone]      SB 3 months ring removal

## 2017-08-30 NOTE — Progress Notes (Signed)
S: 56 YO woman here for removal of her estring. This medication is working well for her atrophic vaginitis    She did not follow through with PT for urinary incontinence due to appt timing at the PT office, but she is also feeling this is a less urgent concern. She voids every hour or 2 and this is working oK.    Would like to change PCP    O: Appears well  Vag pink moist    A: Pt is not able to remove her estring  Improved SUI    P: Estring removed. She will pick up refill.  RTC 3 months for removal. If she'd like to be re-referred to PT I'll be happy to help.

## 2017-09-19 ENCOUNTER — Ambulatory Visit (HOSPITAL_BASED_OUTPATIENT_CLINIC_OR_DEPARTMENT_OTHER): Payer: No Typology Code available for payment source | Admitting: Internal Medicine

## 2017-09-19 ENCOUNTER — Encounter (HOSPITAL_BASED_OUTPATIENT_CLINIC_OR_DEPARTMENT_OTHER): Payer: Self-pay | Admitting: Internal Medicine

## 2017-09-19 VITALS — BP 104/62 | HR 94 | Temp 98.2°F | Wt 141.0 lb

## 2017-09-19 DIAGNOSIS — R5383 Other fatigue: Principal | ICD-10-CM

## 2017-09-19 DIAGNOSIS — H8110 Benign paroxysmal vertigo, unspecified ear: Secondary | ICD-10-CM

## 2017-09-19 DIAGNOSIS — M79672 Pain in left foot: Secondary | ICD-10-CM

## 2017-09-19 DIAGNOSIS — R5381 Other malaise: Secondary | ICD-10-CM

## 2017-09-19 DIAGNOSIS — Z23 Encounter for immunization: Secondary | ICD-10-CM

## 2017-09-19 DIAGNOSIS — F3340 Major depressive disorder, recurrent, in remission, unspecified: Secondary | ICD-10-CM

## 2017-09-19 LAB — BASIC METABOLIC PANEL
ANION GAP: 8 mmol/L (ref 5–15)
BUN (UREA NITROGEN): 15 mg/dL (ref 7–18)
CALCIUM: 9.1 mg/dL (ref 8.5–10.1)
CARBON DIOXIDE: 30 mmol/L (ref 21–32)
CHLORIDE: 104 mmol/L (ref 98–107)
CREATININE: 0.7 mg/dL (ref 0.4–1.2)
ESTIMATED GLOMERULAR FILT RATE: >60 mL/min (ref >60)
Glucose Random: 102 mg/dL (ref 74–160)
POTASSIUM: 5 mmol/L (ref 3.5–5.1)
SODIUM: 142 mmol/L (ref 136–145)

## 2017-09-19 LAB — HEPATIC FUNCTION PANEL
ALANINE AMINOTRANSFERASE: 24 U/L (ref 12–45)
ALBUMIN: 3.6 g/dL (ref 3.4–5.0)
ALKALINE PHOSPHATASE: 80 U/L (ref 45–117)
ASPARTATE AMINOTRANSFERASE: 25 U/L (ref 8–34)
BILIRUBIN DIRECT: 0.1 mg/dl (ref 0.0–0.2)
BILIRUBIN TOTAL: 0.3 mg/dL (ref 0.2–1.0)
INDIRECT BILIRUBIN: 0.2 mg/dL (ref 0.2–0.9)
TOTAL PROTEIN: 7.3 g/dL (ref 6.4–8.2)

## 2017-09-19 LAB — VITAMIN D,25 HYDROXY: VITAMIN D,25 HYDROXY: 26 ng/mL — ABNORMAL LOW (ref 30.0–100.0)

## 2017-09-19 NOTE — Progress Notes (Signed)
Cc: multiple issues     Vertigo  She notes that when she turns suddenly for the past few weeks feels as if everything is spinning, this lasts a few seconds  The patient denies any symptoms of neurological impairment or TIA's; no amaurosis, diplopia, dysphasia, or unilateral disturbance of motor or sensory function. No loss of balance   No hearing loss or tinnitus     Fatigue  She feels that she wakes up but then doesn't have motivation during the day  She notes that she has been feeling tired throughout the day  She snores occasionally, doesn't stop breathing - she notes prior sleep study was normal - has not gained significant weight   Most Recent Weight Reading(s)  08/30/17 : 63.5 kg (140 lb)  05/30/17 : 65.8 kg (145 lb)  05/13/17 : 64.4 kg (142 lb)  02/22/17 : 64.6 kg (142 lb 8 oz)  01/24/17 : 62.6 kg (138 lb)    Patient denies any exertional chest pain, dyspnea, palpitations, syncope, orthopnea, edema or paroxysmal nocturnal dyspnea.   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 Housecleaning            7/17:  Continues housecleaning.  Daughter Christ Kick is starting at [employer] in Sept. Pt also has 2 older sons. Husb Jose. Denies DV.      Mood is fine, no psych issues.      ROS: No fevers or unexplained weight loss. No new headaches. No shortness of breath or chest pain.     BP 104/62 (Site: RA, Position: Sitting, Cuff Size: Reg)  Pulse 94  Temp 98.2 F (36.8 C) (Oral)  Wt 64 kg (141 lb)  LMP 11/02/2009  SpO2 96%  BMI 25.79 kg/m2  Ear exam - both sides normal, TM intact without perforation or effusion, external canal normal. No significant cerumenosis noted.   Eyes: no nystagmus  Heart: S1 and S2 normal, no murmurs, clicks, gallops or rubs. Regular rate and rhythm.   Lungs:  clear; no wheezes, rhonchi or rales.  MMSE: well groomed, affect normal, no tangential thoughts, no pressured speech, no agitation or psychomotor slowing, normal  response-time to questions  Feet exam: no swelling, tenderness or skin or vascular lesions. Color and temperature is normal. Sensation is intact. Peripheral pulses are palpable. Toenails are normal.     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  I believe that she has benign positional vertigo.  I have discussed with her the diagnosis and treatment including to try to avoid sudden movements.  She understands that if the symptoms are worsening she should call because there are specialty centers which can do certain maneuvers which may be able to relieve the symptoms.  Did not prescribe medicine today.   Will call if not improving in 6 weeks.         The patient was ready to learn and no apparent learning or adherence barriers were identified. I explained the diagnosis and treatment plan, and the patient expressed understanding of the content. I attempted to answer any questions regarding the diagnosis and the proposed treatment.    Possible side effects of the prescribed medication was explained. We discussed the patients current medications.  We discussed the importance of medication compliance. The patient expressed understanding and no barriers to adherence were identified.    follow-up will be scheduled for 5 months from now  she has been advised to call or return with any worsening or new problems

## 2017-09-19 NOTE — Progress Notes (Signed)
Influenza Vaccine Procedure  September 19, 2017    1. Has the patient received the information for the influenza vaccine? Yes    2. Does the patient have any of the following contraindications?  Allergy to eggs? No  Allergic reaction to previous influenza vaccines? No  Any other problems to previous influenza vaccines? No  Paralyzed by Guillain-Barre syndrome?  No  Current moderate or severe illness? No  Allergy to contact lens solution? No      3. The vaccine has been administered in the usual fashion.     Immunization information reviewed. Current VIS reviewed and given to patient/ guardian. Verbal assent obtained from patient/ guardian.  See immunization/Injection module or chart review for date of publication and additional information. Verbal assent obtained from patient/guardian. Comfort measures for possible side effects reviewed.

## 2017-09-21 ENCOUNTER — Encounter (HOSPITAL_BASED_OUTPATIENT_CLINIC_OR_DEPARTMENT_OTHER): Payer: Self-pay | Admitting: Internal Medicine

## 2017-10-05 ENCOUNTER — Encounter (HOSPITAL_BASED_OUTPATIENT_CLINIC_OR_DEPARTMENT_OTHER): Payer: Self-pay | Admitting: Internal Medicine

## 2017-10-05 ENCOUNTER — Other Ambulatory Visit (HOSPITAL_BASED_OUTPATIENT_CLINIC_OR_DEPARTMENT_OTHER): Payer: Self-pay | Admitting: Internal Medicine

## 2017-10-05 ENCOUNTER — Telehealth (HOSPITAL_BASED_OUTPATIENT_CLINIC_OR_DEPARTMENT_OTHER): Payer: Self-pay

## 2017-10-05 DIAGNOSIS — E781 Pure hyperglyceridemia: Secondary | ICD-10-CM | POA: Insufficient documentation

## 2017-10-05 DIAGNOSIS — R9389 Abnormal findings on diagnostic imaging of other specified body structures: Secondary | ICD-10-CM

## 2017-10-05 NOTE — Progress Notes (Signed)
Katio, please let her know I reviewed her entire old chart  Everything looks good, except she needs to repeat a CXR - please have her come soon to have that done, i've placed orders  Thanks, [employer]

## 2017-10-05 NOTE — Progress Notes (Signed)
Telephone call to pt. Left voicemail message. Advised pt. To go to radiology for CXR. Per PCP.

## 2017-10-05 NOTE — Progress Notes (Signed)
Loretta Martin, please let her know I reviewed her entire old chart  Everything looks good, except she needs to repeat a CXR - please have her come soon to have that done, i've placed orders  Thanks, [employer]

## 2017-10-06 ENCOUNTER — Ambulatory Visit: Payer: Self-pay | Admitting: Internal Medicine

## 2017-10-06 DIAGNOSIS — R9389 Abnormal findings on diagnostic imaging of other specified body structures: Secondary | ICD-10-CM

## 2017-10-06 LAB — XR CHEST 2 VIEWS

## 2017-10-06 NOTE — Addendum Note (Signed)
Addended by: Whitney Post on: 10/06/2017 08:28 AM     Modules accepted: Orders

## 2017-10-10 ENCOUNTER — Encounter (HOSPITAL_BASED_OUTPATIENT_CLINIC_OR_DEPARTMENT_OTHER): Payer: Self-pay | Admitting: Internal Medicine

## 2017-10-11 ENCOUNTER — Ambulatory Visit (HOSPITAL_BASED_OUTPATIENT_CLINIC_OR_DEPARTMENT_OTHER): Payer: PRIVATE HEALTH INSURANCE | Admitting: Podiatrist

## 2017-10-11 ENCOUNTER — Encounter (HOSPITAL_BASED_OUTPATIENT_CLINIC_OR_DEPARTMENT_OTHER): Payer: Self-pay | Admitting: Podiatrist

## 2017-10-11 DIAGNOSIS — M21612 Bunion of left foot: Secondary | ICD-10-CM

## 2017-10-11 DIAGNOSIS — M7742 Metatarsalgia, left foot: Secondary | ICD-10-CM

## 2017-10-11 LAB — XR FOOT LEFT MINIMUM 3 VIEWS

## 2017-10-11 NOTE — Progress Notes (Signed)
Subjective:     Loretta Martin is a 57 year old female patient who presents to the clinic for evaluation of metatarsalgia. She states that she has had pain in her left foot worse than her right for years. She states that the pain is only while walking. She Denies trying inserts, NSAIDS, or other conservative options. She states she gets some relief with ice.     ROS: Denies fever, nausea, vomiting, chills and shortness of breath. Left forefoot pain noted.     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    treated [facility] 11/97 CSF VDRL neg 9/00 RPR 9/00 1:2 repeat RPR yearly     Nonspecific abnormal results of thyroid function study 08/31/2004    increased TSH 4.19 6/00; TSH nl 9/00; 05/04/99 - wnl    Presbyopia 04/23/2009    PVD (posterior vitreous detachment) 04/23/2009    Trichomoniasis, unspecified 08/31/2004    10/97    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    +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 - had negative CXR pp at [facility] referred to TB clinic    Urinary tract infection, site not specified 08/31/2004    4/02, 6/02        Past Surgical History:  Past Surgical History:  No date: BREAST BIOPSY & NEEDLE LOC WIR  No date: EXCISION EXCESSIVE SKIN & SUBQ TISSUE ABDOMEN      Comment:  abdominoplasty in [place]  1998: LIG/TRNSXJ FLP TUBE ABDL/VAG APPR UNI/BI      Comment:  Tubal Ligation  No date: OB ANTEPARTUM CARE CESAREAN DLVR & POSTPARTUM      Comment:  c/section x 2  2004: REDUCTION MAMMAPLASTY  No date: TRURL RF FEMALE BLADDER NECK STRS URIN INCONT    Familial History:  Family History   Problem Relation Age of Onset    Diabetes Mother     Hypertension Mother        Medications:    Current Outpatient Medications on File Prior to Visit:  PROAIR HFA 108 (90 Base) MCG/ACT inhaler Inhale 1 puff into the lungs every 4 (four)  hours as needed for Wheezing Disp: 3 Inhaler Rfl: 0   estradiol (ESTRING) 2 MG vaginal ring Place 2 mg vaginally every 3 (three) months Disp: 1 each Rfl: 3   Cholecalciferol (VITAMIN D3) 400 units tablet take 1 tablet by mouth once daily Disp: 30 tablet Rfl: 11   FLUoxetine (PROZAC) 20 MG capsule Take 2 capsules by mouth daily Disp: 180 capsule Rfl: 3   fluticasone (FLONASE) 50 MCG/ACT nasal spray 2 sprays by Each Nostril route daily Disp: 1 Bottle Rfl: 11   [DISCONTINUED] Estradiol (ESTROGEL) 0.75 MG/1.25 GM (0.06%) topical gel Place 1.25 g onto the skin every 3 (three) days. Disp: 50 g Rfl: 11   Multiple Vitamin (MULTIVITAMIN) TABS Take 1 tablet by mouth daily. Disp:  Rfl:    omega-3 acid ethyl esters (LOVAZA) 1 GM capsule Take 2 g by mouth 2 (two) times daily. Disp:  Rfl:      No current facility-administered medications on file prior to visit.     Allergies:  Review of Patient's Allergies indicates:   Hydrocodone-acetami*    Nausea and Vomiting   Pcn [penicillins]           Comment:Got  a urine infection after using    Social History:  Social History     Socioeconomic History    Marital status: Divorced     Spouse name: Not on file    Number of children: 3    Years of education: Not on file    Highest education level: Not on file   Social Needs    Financial resource strain: Not on file    Food insecurity - worry: Not on file    Food insecurity - inability: Not on file    Transportation needs - medical: Not on file    Transportation needs - non-medical: Not on file   Occupational History    Occupation: housecleaning     Employer: [employer]   Tobacco Use    Smoking status: Former Smoker     Start date: 10/05/2003     Last attempt to quit: 08/10/2004     Years since quitting: 13.1    Smokeless tobacco: Never Used    Tobacco comment: smoked only for 6 months.   Substance and Sexual Activity    Alcohol use: Yes     Comment: minimal    Drug use: No    Sexual activity: Yes     Partners: Male     Birth  control/protection: Surgical     Comment: men age 50 q 34, menorrhagia 2006, past history of syphillis and rx , 1st SA age 69, 17 lietime partners, separated again from her husband.   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  Daughter Christ Kick is starting at [employer] in Sept. Pt also has 2 older sons. Husb Jose. Denies DV.    Mood is fine, no psych issues.         Objective:   General: AOx3 with pleansant affect    Vascular: DP/PT 2/4. Skin temp warm. No edema noted  Neuro: Light touch and sharp sensation intact bilaterally. Negative Mulders. Negative sullivans sign of the left foot   Derm: Fat pad atrophy noted. No open lesions or hyperkeratosis noted. Skin turgor WNL.  Ortho: Left 2nd digit positive lachmans test with pain. Discomfort performing lachmans of left 3rd. No pain on distraction of digits noted. General forefoot hypermobility noted. No gross deformity noted on  X-ray. No signs of fracture noted. Rectus foot type noted on weight bearing. Parabola appears intact and length of 2nd appears appropriate with relation to the 1st.     Assessment:  1. Left 2nd digit plantar plate  disruption    Plan:    [name] is a 57 y.o. Female presenting with a Grade 0 plantar plate disruption of the left 2nd digit.    - X-rays were performed to rule out fracture or biomechanical deformity  - She was prescribed OTC orthotics with metatasal pads with [employer]  - NSAIDS were prescribed to be used as needed for pain   - RTC in 2-3 months if needed

## 2017-10-11 NOTE — Progress Notes (Signed)
Podiatric medicine and surgery note    CHIEF COMPLAINT    Patient presents with:  Other: pain bottom of the left foot      HPI    Loretta Martin is a 57 year old female who presents with identified pain to the ball of her left foot for several years.  She cannot identify any precipitating factors with no established trauma.  She also suggests having some level of similar pain affecting her right foot.  She is appreciated no focal swelling or discoloration.  She appreciates the pain with weightbearing activity.  She has done some cryotherapy with weekly minimally obtained.  She has attempted no other interventions.    REVIEW OF SYSTEMS    See HPI for further details. Review of systems otherwise negative.     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    treated [facility] 11/97 CSF VDRL neg 9/00 RPR 9/00 1:2 repeat RPR yearly     Nonspecific abnormal results of thyroid function study 08/31/2004    increased TSH 4.19 6/00; TSH nl 9/00; 05/04/99 - wnl    Presbyopia 04/23/2009    PVD (posterior vitreous detachment) 04/23/2009    Trichomoniasis, unspecified 08/31/2004    10/97    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    +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 - had negative CXR pp at [facility] referred to TB clinic    Urinary tract infection, site not specified 08/31/2004    4/02, 6/02       SURGICAL HISTORY  Past Surgical History:  No date: BREAST BIOPSY & NEEDLE LOC WIR  No date: EXCISION EXCESSIVE SKIN & SUBQ TISSUE ABDOMEN      Comment:  abdominoplasty in [place]  1998: LIG/TRNSXJ FLP TUBE ABDL/VAG APPR UNI/BI      Comment:  Tubal Ligation  No date: OB ANTEPARTUM CARE CESAREAN DLVR & POSTPARTUM      Comment:  c/section x 2  2004: REDUCTION MAMMAPLASTY  No date: TRURL RF FEMALE BLADDER NECK STRS URIN INCONT    CURRENT MEDICATIONS       Current Outpatient Medications:     PROAIR HFA 108 (90 Base) MCG/ACT inhaler, Inhale 1 puff into the lungs every 4 (four) hours as needed for Wheezing, Disp: 3 Inhaler, Rfl: 0    estradiol (ESTRING) 2 MG vaginal ring, Place 2 mg vaginally every 3 (three) months, Disp: 1 each, Rfl: 3    Cholecalciferol (VITAMIN D3) 400 units tablet, take 1 tablet by mouth once daily, Disp: 30 tablet, Rfl: 11    FLUoxetine (PROZAC) 20 MG capsule, Take 2 capsules by mouth daily, Disp: 180 capsule, Rfl: 3    fluticasone (FLONASE) 50 MCG/ACT nasal spray, 2 sprays by Each Nostril route daily, Disp: 1 Bottle, Rfl: 11    Multiple Vitamin (MULTIVITAMIN) TABS, Take 1 tablet by mouth daily., Disp: , Rfl:     omega-3 acid ethyl esters (LOVAZA) 1 GM capsule, Take 2 g by mouth 2 (two) times daily., Disp: , Rfl:     ALLERGIES    Review of Patient's Allergies indicates:   Hydrocodone-acetami*    Nausea and Vomiting   Pcn [penicillins]           Comment:Got a urine infection after using    SOCIAL HISTORY  Social History  Socioeconomic History    Marital status: Divorced     Spouse name: Not on file    Number of children: 3    Years of education: Not on file    Highest education level: Not on file   Social Needs    Financial resource strain: Not on file    Food insecurity - worry: Not on file    Food insecurity - inability: Not on file    Transportation needs - medical: Not on file    Transportation needs - non-medical: Not on file   Occupational History    Occupation: housecleaning     Employer: [employer]   Tobacco Use    Smoking status: Former Smoker     Start date: 10/05/2003     Last attempt to quit: 08/10/2004     Years since quitting: 13.1    Smokeless tobacco: Never Used    Tobacco comment: smoked only for 6 months.   Substance and Sexual Activity    Alcohol use: Yes     Comment: minimal    Drug use: No    Sexual activity: Yes     Partners: Male     Birth control/protection: Surgical     Comment: men age 52 q 6,  menorrhagia 2006, past history of syphillis and rx , 1st SA age 7, 77 lietime partners, separated again from her husband.   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  Daughter Christ Kick is starting at [employer] in Sept. Pt also has 2 older sons. Husb Jose. Denies DV.    Mood is fine, no psych issues.       FAMILY HISTORY  Family History   Problem Relation Age of Onset    Diabetes Mother     Hypertension Mother        PHYSICAL EXAM    Vital Signs: LMP 11/02/2009  Presents afebrile and hemodynamically stabile  Constitutional:  Well-developed, Well-nourished, No acute distress, Non-toxic appearance.   Cardiovascular: Exhibits a warm and well-perfused foot bilateral.  Distal pulses are palpable.  Capillary filling time is immediate.  Skin temperature gradient is normal.  There is no pitting edema.  Musculoskeletal: Presents independently ambulating without any notable impedance.  No visible swelling or discoloration to either  foot is appreciated.  She does exhibit a mild left bunion deformity.  She does have pain with palpation directly inferior to the second metatarsal head left.  She is able to actively  flex and extend the digits.  I appreciate no gross instability of the lesser metatarsal phalangeal joint with negative mini Lachman test.  The second toe is anatomic in all cardinal planes.  It purchases the ground with weightbearing.  She exhibits an otherwise average foot type with no significant pronatory changes appreciated.  Muscle strength is 5 out of 5 for all lower extremity groups.  There is some atrophy of the plantar fat pad to the forefoot bilateral.  Skin:  skin color, texture, turgor are normal  Lymphatic:  No lymphadenopathy noted.   Neurological: Gait normal. Reflexes normal and symmetric. Sensation grossly normal  Psychiatric:  interactive with questions        RADIOLOGY  Weightbearing radiographs of the left foot 4 views were obtained today.  Soft tissue envelope is intact.  There is overall anatomic alignment of the foot with no acute skeletal derangement appreciated.    LABS  None    PROCEDURES  None      FINAL IMPRESSION  Metatarsalgia, left foot  (primary encounter diagnosis)  Bunion, left    Patient appears to be suffering from an overuse metatarsalgia of the left foot without gross skeletal derangement or disturbance.  She has been referred to website to obtain a prefabricated orthotic specific to her foot issue.  She should attempt this initial measure and assess response.  If there is a failure to improve or in fact there is worsening we may consider advanced imaging such as an MRI I do not believe this will be necessary.  Patient endorses understanding and is amenable to this.    FOLLOW-UP  No follow up scheduled at this time      A majority of this note has been dictated with a voice recognition system. Occasional wrong-word or "sound-A-like" substitutions may have occurred due to the inherent limitations of voice recognition software. Read the chart carefully and recognize, using context, where substitutions have occurred. Please excuse any identified errors in spelling or syntax. Every  effort has been made to appropriately edit and correct upon completion.      Electronically signed by: Astrid Divine. Delsin Copen,DPM, 10/11/2017 12:27 PM

## 2017-10-12 ENCOUNTER — Encounter (HOSPITAL_BASED_OUTPATIENT_CLINIC_OR_DEPARTMENT_OTHER): Payer: Self-pay | Admitting: Internal Medicine

## 2017-10-12 DIAGNOSIS — M79672 Pain in left foot: Secondary | ICD-10-CM | POA: Insufficient documentation

## 2017-11-28 ENCOUNTER — Ambulatory Visit (HOSPITAL_BASED_OUTPATIENT_CLINIC_OR_DEPARTMENT_OTHER): Payer: PRIVATE HEALTH INSURANCE | Admitting: Obstetrics & Gynecology

## 2017-12-16 ENCOUNTER — Telehealth (HOSPITAL_BASED_OUTPATIENT_CLINIC_OR_DEPARTMENT_OTHER): Payer: Self-pay | Admitting: Registered Nurse

## 2017-12-16 NOTE — Progress Notes (Signed)
Pt states she was late for her appt with S.Bansen in Feb for removal of her Estring ring.    Rescheduled to Monday 3/18 at 940.

## 2017-12-16 NOTE — Telephone Encounter (Signed)
-----   Message from [facility] sent at 12/16/2017 10:04 AM EDT -----  Regarding: Nuvaring issues  Loretta Martin [name] [id number], 57 year old, female, Telephone Information:  Home Phone      [phone]  Work Phone      Not on file.  Mobile          [phone]      CALL BACK NUMBER: home  Cell phone:   Other phone:    Available times:    Patient's language of care: English    Patient does not need an interpreter.    Patient's PCP: [name], MD    Person calling on behalf of patient: Patient (self)    Calls today : Nuvaring issues

## 2017-12-19 ENCOUNTER — Ambulatory Visit (HOSPITAL_BASED_OUTPATIENT_CLINIC_OR_DEPARTMENT_OTHER): Payer: No Typology Code available for payment source | Admitting: Obstetrics & Gynecology

## 2017-12-19 VITALS — BP 96/65 | Wt 143.0 lb

## 2017-12-19 DIAGNOSIS — T192XXD Foreign body in vulva and vagina, subsequent encounter: Secondary | ICD-10-CM

## 2017-12-19 NOTE — Progress Notes (Signed)
S: 57 YO woman here for need to have estring removed.    Pt is able to place her estring each 3 months but is not able to remove it.  This time is late in starting with new ring since she arrived too late for last visit  Feels some vag irritation as effectiveness of the current ring has waned    O: appears well  Vag pink moist  Ring removed easily    A: Estring removal  P: Ring removed. Pt will place new one  Due for GYN annual when due for next ring change--will schedule. Reita Chard, APRN

## 2017-12-19 NOTE — Patient Instructions (Signed)
SB 3 months annual mid-June

## 2018-01-21 ENCOUNTER — Other Ambulatory Visit (HOSPITAL_BASED_OUTPATIENT_CLINIC_OR_DEPARTMENT_OTHER): Payer: Self-pay | Admitting: Internal Medicine

## 2018-01-22 NOTE — Progress Notes (Signed)
PER Pharmacy, Loretta Martin Briseyda Fehr is a 57 year old female has requested a refill of     -  Fluoxetine       Last Office Visit: 09/19/2017 with pieter cohen   Last Physical Exam: 01/24/2017    There are no preventive care reminders to display for this patient.    Other Med Adult:  Most Recent BP Reading(s)  12/19/17 : 96/65        Cholesterol (mg/dL)   Date Value   05/26/2016 221     LOW DENSITY LIPOPROTEIN DIRECT (mg/dL)   Date Value   05/26/2016 144     HIGH DENSITY LIPOPROTEIN (mg/dL)   Date Value   05/26/2016 50     TRIGLYCERIDES (mg/dl)   Date Value   08/10/2005 79         THYROID SCREEN TSH REFLEX FT4 (uIU/mL)   Date Value   11/01/2016 3.660         TSH (THYROID STIM HORMONE) (uIU/mL)   Date Value   05/26/2016 8.300 (H)       HEMOGLOBIN A1C (%)   Date Value   05/26/2016 5.5       No results found for: POCA1C      INR (no units)   Date Value   05/12/2009 1.0 (L)       SODIUM (mmol/L)   Date Value   09/19/2017 142       POTASSIUM (mmol/L)   Date Value   09/19/2017 5.0           CREATININE (mg/dL)   Date Value   09/19/2017 0.7       Documented patient preferred pharmacies:    [facility] [place], [place] - [place]  Phone: [phone] Fax: [phone]

## 2018-01-23 ENCOUNTER — Ambulatory Visit (HOSPITAL_BASED_OUTPATIENT_CLINIC_OR_DEPARTMENT_OTHER): Payer: PRIVATE HEALTH INSURANCE | Admitting: Obstetrics & Gynecology

## 2018-03-16 ENCOUNTER — Other Ambulatory Visit (HOSPITAL_BASED_OUTPATIENT_CLINIC_OR_DEPARTMENT_OTHER): Payer: Self-pay | Admitting: Obstetrics & Gynecology

## 2018-03-16 NOTE — Progress Notes (Signed)
PER Patient (self), Loretta Martin Record is a 57 year old female has requested a refill of estring.      Last Office Visit: 09/19/17 with pcp  Last Physical Exam: 01/24/17    There are no preventive care reminders to display for this patient.    Other Med Adult:  Most Recent BP Reading(s)  12/19/17 : 96/65        Cholesterol (mg/dL)   Date Value   05/26/2016 221     LOW DENSITY LIPOPROTEIN DIRECT (mg/dL)   Date Value   05/26/2016 144     HIGH DENSITY LIPOPROTEIN (mg/dL)   Date Value   05/26/2016 50     TRIGLYCERIDES (mg/dl)   Date Value   08/10/2005 79         THYROID SCREEN TSH REFLEX FT4 (uIU/mL)   Date Value   11/01/2016 3.660         TSH (THYROID STIM HORMONE) (uIU/mL)   Date Value   05/26/2016 8.300 (H)       HEMOGLOBIN A1C (%)   Date Value   05/26/2016 5.5       No results found for: POCA1C      INR (no units)   Date Value   05/12/2009 1.0 (L)       SODIUM (mmol/L)   Date Value   09/19/2017 142       POTASSIUM (mmol/L)   Date Value   09/19/2017 5.0           CREATININE (mg/dL)   Date Value   09/19/2017 0.7       Documented patient preferred pharmacies:    [facility] [place], [place] - [place]  Phone: [phone] Fax: [phone]

## 2018-03-22 ENCOUNTER — Other Ambulatory Visit (HOSPITAL_BASED_OUTPATIENT_CLINIC_OR_DEPARTMENT_OTHER): Payer: Self-pay | Admitting: Internal Medicine

## 2018-03-22 MED ORDER — VITAMIN D3 400 UNITS PO TABS: 400 [IU] | tablet | Freq: Every day | ORAL | 11 refills | 0 days | Status: AC

## 2018-03-22 MED ORDER — VITAMIN D3 10 MCG (400 UNIT) PO TABS
400.0000 [IU] | ORAL_TABLET | Freq: Every day | ORAL | 11 refills | Status: DC
Start: 2018-03-22 — End: 2019-03-26

## 2018-03-22 NOTE — Progress Notes (Signed)
[facility] INTERNAL MED    Person calling on behalf of patient: Patient (self)    May list multiple medications in this section    Medicine Name: VITAMIN D    Dosage:    Frequency (how many pills, how many times a day):    Number of pills left:    Documented patient preferred pharmacies:   [facility] [place], [place] - [place]  Phone: [phone] Fax: [phone]          Patient's language of care: [place]

## 2018-03-22 NOTE — Progress Notes (Signed)
PER Patient (self), Loretta Martin is a 57 year old female has requested a refill of vitmain d.      Last Office Visit: 12/19/17 with bansen  Last Physical Exam: 01/24/17    There are no preventive care reminders to display for this patient.    Other Med Adult:  Most Recent BP Reading(s)  12/19/17 : 96/65        Cholesterol (mg/dL)   Date Value   05/26/2016 221     LOW DENSITY LIPOPROTEIN DIRECT (mg/dL)   Date Value   05/26/2016 144     HIGH DENSITY LIPOPROTEIN (mg/dL)   Date Value   05/26/2016 50     TRIGLYCERIDES (mg/dl)   Date Value   08/10/2005 79         THYROID SCREEN TSH REFLEX FT4 (uIU/mL)   Date Value   11/01/2016 3.660         TSH (THYROID STIM HORMONE) (uIU/mL)   Date Value   05/26/2016 8.300 (H)       HEMOGLOBIN A1C (%)   Date Value   05/26/2016 5.5       No results found for: POCA1C      INR (no units)   Date Value   05/12/2009 1.0 (L)       SODIUM (mmol/L)   Date Value   09/19/2017 142       POTASSIUM (mmol/L)   Date Value   09/19/2017 5.0           CREATININE (mg/dL)   Date Value   09/19/2017 0.7       Documented patient preferred pharmacies:    [facility] [place], [place] - [place]  Phone: [phone] Fax: [phone]

## 2018-03-27 ENCOUNTER — Encounter (HOSPITAL_BASED_OUTPATIENT_CLINIC_OR_DEPARTMENT_OTHER): Payer: Self-pay | Admitting: Obstetrics & Gynecology

## 2018-03-27 ENCOUNTER — Ambulatory Visit (HOSPITAL_BASED_OUTPATIENT_CLINIC_OR_DEPARTMENT_OTHER): Payer: 59 | Admitting: Obstetrics & Gynecology

## 2018-03-27 VITALS — BP 109/78 | Ht 62.0 in | Wt 141.0 lb

## 2018-03-27 DIAGNOSIS — N393 Stress incontinence (female) (male): Secondary | ICD-10-CM | POA: Diagnosis not present

## 2018-03-27 DIAGNOSIS — Z01411 Encounter for gynecological examination (general) (routine) with abnormal findings: Secondary | ICD-10-CM | POA: Diagnosis not present

## 2018-03-27 NOTE — Progress Notes (Signed)
57 year old woman here for complete annual exam. She plans to see her primary care provider for an annual exam in the next year.     OB History   G3  P3  T0  P1  A0  L3    SAB0  TAB0  Ectopic0  Molar0  Multiple0  Live Births0       Comment: C/S X 2 followed by VBAC     CC/HPI: has issues with stress incont still--had surgery that didn't help, several years ago.  Also thinks she has hernia found on CXR in [place], some nausea, joint pain and increased need for sleep    GYN History:  post menopausal  Uses estring for atrophic vaginitis, helpful.   Sexual History: sexually active, monogamous and female partner  Past GYN History: SUI and pain upon penetration    Significant OB History: no change since last visit    Current Outpatient Medications   Medication Sig    Cholecalciferol (VITAMIN D3) 400 units tablet Take 1 tablet by mouth daily    ESTRING 2 MG vaginal ring PLACE 2 MILLIGRAM VAGINALLY EVERY 3 MONTHS    FLUoxetine (PROZAC) 20 MG capsule Take 2 capsules by mouth daily    PROAIR HFA 108 (90 Base) MCG/ACT inhaler Inhale 1 puff into the lungs every 4 (four) hours as needed for Wheezing    fluticasone (FLONASE) 50 MCG/ACT nasal spray 2 sprays by Each Nostril route daily    Multiple Vitamin (MULTIVITAMIN) TABS Take 1 tablet by mouth daily.    omega-3 acid ethyl esters (LOVAZA) 1 GM capsule Take 2 g by mouth 2 (two) times daily.     No current facility-administered medications for this visit.        Hydrocodone-Acetaminophen; Pcn [Penicillins]    Patient Active Problem List:     Nonspecific reaction to tuberculin skin test without active tuberculosis     Major depressive disorder, recurrent, in remission ([place])     Mammographic microcalcification, normal BX     IBS (irritable bowel syndrome)     PVD (posterior vitreous detachment)     Cysticercosis of central nervous system     Female stress incontinence     Family history of diabetes mellitus type II     Cervical radiculopathy     Seasonal allergic rhinitis      BMI 26.0-26.9,adult     Varicose veins of lower extremity     Reactive airway disease     Subclinical hypothyroidism     h/o Abnormal CXR, resolved     Left foot pain      Past Surgical History:  No date: BREAST BIOPSY & NEEDLE LOC WIR  No date: EXCISION EXCESSIVE SKIN & SUBQ TISSUE ABDOMEN      Comment:  abdominoplasty in [place]  1998: LIG/TRNSXJ FLP TUBE ABDL/VAG APPR UNI/BI      Comment:  Tubal Ligation  No date: OB ANTEPARTUM CARE CESAREAN DLVR & POSTPARTUM      Comment:  c/section x 2  2004: REDUCTION MAMMAPLASTY  No date: TRURL RF FEMALE BLADDER NECK STRS URIN INCONT    Social History     Socioeconomic History    Marital status: Divorced     Spouse name: Not on file    Number of children: 3    Years of education: Not on file    Highest education level: Not on file   Occupational History    Occupation: housecleaning     Employer: [employer]  Needs    Financial resource strain: Not on file    Food insecurity:     Worry: Not on file     Inability: Not on file    Transportation needs:     Medical: Not on file     Non-medical: Not on file   Tobacco Use    Smoking status: Former Smoker     Start date: 10/05/2003     Last attempt to quit: 08/10/2004     Years since quitting: 13.6    Smokeless tobacco: Never Used    Tobacco comment: smoked only for 6 months.   Substance and Sexual Activity    Alcohol use: Yes     Comment: minimal    Drug use: No    Sexual activity: Yes     Partners: Male     Birth control/protection: Surgical     Comment: men age 26 q 69, menorrhagia 2006, past history of syphillis and rx , 1st SA age 60, 42 lietime partners, separated again from her husband.   Lifestyle    Physical activity:     Days per week: Not on file     Minutes per session: Not on file    Stress: Not on file   Relationships    Social connections:     Talks on phone: Not on file     Gets together: Not on file     Attends religious service: Not on file     Active member of club or organization: Not on file      Attends meetings of clubs or organizations: Not on file     Relationship status: Not on file    Intimate partner violence:     Fear of current or ex partner: Not on file     Emotionally abused: Not on file     Physically abused: Not on file     Forced sexual activity: Not on file   Other Topics Concern    Military Service No    Blood Transfusions No    Caffeine Concern Not Asked    Occupational Exposure No    Hobby Hazards No    Sleep Concern No    Stress Concern No    Weight Concern Yes    Special Diet No    Back Care Not Asked    Exercise No     Comment: not much    Bike Helmet Not Asked    Seat Belt Yes    Self-Exams Not Asked   Social History Narrative    To [place] from [place] in 1991    In [place] did not graduate from high school     Didn't work in [place] & then helped husband at his farm        Has 3 children        Lives with 2 children, 1 is married    2 older children working    Works Aeronautical engineer    Remarried to husband,        6/16: 2 younger 10 and 9 YO children live with her and her husband, one other child married and living in [place]. 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  Daughter Christ Kick is starting at [employer] in Sept. Pt also has 2 older sons. Husb Jose. Denies DV.    Mood is fine, no psych issues.             Exercise: housecleaning but  nothing outside of this  Diet: adequate nutrition, adequate fruits & vegetables, adequate calcium, adequate protein.    Review of patient's family history indicates:  Problem: Diabetes      Relation: Mother          Age of Onset: (Not Specified)  Problem: Hypertension      Relation: Mother          Age of Onset: (Not Specified)      ROS:  Constitutional: fatigue, can go to sleep at 8pm and still be groggey in the morning  Allergy: none  Endocrine: negative  Dermatologic: negative  Cardiovascular: negative  Respiratory: negative  Gastrointestinal: nausea  Genitourinary: SUI with laugh cough  sneeze  Musculoskeletal: joint pain  Neurological: negative  Psychiatric: negative for depressive sx outside of excessive sleep    PHYSICAL EXAM:  Constitutional: well developed, well nourished [place] female and BMI: WNL  Skin: clear  Neurological: normal and alert and oriented  Lymphatic: no adenopathy noted  Neck: supple and no adenopathy  Thyroid: not enlarged  Chest: clear  Heart: regular rate and rhythm  Breasts: no masses, skin, nipple or axillary changes  Abdomen: no masses or tenderness    PELVIC:  External Genitalia: normal architecture  Urethral Meatus: normal size and location, without lesions or prolapse  Urethra: without masses, tenderness or scarring  Bladder: without fullness, masses or tenderness  Vagina: well rugated and no lesions  Vaginal Discharge: normal appearing  Pelvic supports: normal  Cervix: no lesions  Uterus: anteverted, normal size and non-tender  Adnexa: no masses, nodularity, tenderness    Extremities: normal  Anus and Perineum: normal  Rectum: normal and stool negative for blood      ASSESSMENT & PLAN:  (N39.3) Urinary, incontinence, stress female  Comment: referred to PT  Plan: REFERRAL TO PHYSICAL THERAPY ( INT)            (Z01.411) Encounter for gynecological examination with abnormal finding  Comment: Dyspareunia--gave lube samples to try--let me know if this is not helping.  Plan: FECAL OCCULT (POINT OF CARE) OFFICE/INPATIENT         TEST 1 CARD        neg       COUNSELING:  exercise, breast self awareness and enc sched with PCP for the issues that she raised that I can't help with. Estring removed, sched in 3 months for removal.

## 2018-03-27 NOTE — Patient Instructions (Addendum)
Please see  [name] about:     nausea  Joint pain  Hernia   increased need for sleep      Lubricant   Astroglide or [place] Personal lubricant

## 2018-05-17 ENCOUNTER — Encounter (HOSPITAL_BASED_OUTPATIENT_CLINIC_OR_DEPARTMENT_OTHER): Payer: Self-pay | Admitting: Obstetrics & Gynecology

## 2018-06-22 ENCOUNTER — Ambulatory Visit (HOSPITAL_BASED_OUTPATIENT_CLINIC_OR_DEPARTMENT_OTHER): Payer: Self-pay | Admitting: Registered Nurse

## 2018-06-22 NOTE — Telephone Encounter (Signed)
Tel call to pt w/ reports of ongoing nausea, has been going on for about 6 months.   Denies emesis. Afebrile.  Denies constipation/diarrhea.   No relation of sx to food choices although nausea mildly worse on empty stomach.   Denies reflux/heartburn.   Denies excessive stress, busy working as Electrical engineer.   + mild dizziness, no lightheadedness, no syncopal events  Sleeping well, ~8 hrs nightly   No coffee or black tea   Denies any add'l sx.   Scheduled w/ PCP Monday morning, advised pt to eat bland, non-irritating foods, lots of rest and fluids over the next few days.   Pt in agreement w/ plan.     Reason for Disposition   Nausea persists > 1 week   Nausea is a chronic symptom (recurrent or ongoing AND present > 4 weeks)    Answer Assessment - Initial Assessment Questions  1. NAUSEA SEVERITY: "How bad is the nausea?" (e.g., mild, moderate, severe; dehydration, weight loss)    - MILD: loss of appetite without change in eating habits    - MODERATE: decreased oral intake without significant weight loss, dehydration, or malnutrition    - SEVERE: inadequate caloric or fluid intake, significant weight loss, symptoms of dehydration      mild  2. ONSET: "When did the nausea begin?"      6 mo ago  3. VOMITING: "Any vomiting?" If so, ask: "How many times today?"      no  4. RECURRENT SYMPTOM: "Have you had nausea before?" If so, ask: "When was the last time?" "What happened that time?"      Yes, ongoing issue   5. CAUSE: "What do you think is causing the nausea?"      unsure  6. PREGNANCY: "Is there any chance you are pregnant?" (e.g., unprotected intercourse, missed birth control pill, broken condom)      No    Protocols used: ADULT NAUSEA-A-AH

## 2018-06-22 NOTE — Telephone Encounter (Signed)
Regarding: Nauseousness  ----- Message from Dorris Singh sent at 06/22/2018 11:49 AM EDT -----  Loretta Martin [id number], 57 year old, female    Calls today:  Sick    What are the symptoms:  nauseousness for a while.  How long has patient been sick?   What has pt. tried at home   Person calling on behalf of patient: Patient (self)    CALL BACK NUMBER: [phone]  Best time to call back:   Cell phone:   Other phone:    Patient's language of care: English    Patient does not need an interpreter.    Patient's PCP: [name], MD

## 2018-06-26 ENCOUNTER — Encounter (HOSPITAL_BASED_OUTPATIENT_CLINIC_OR_DEPARTMENT_OTHER): Payer: Self-pay | Admitting: Internal Medicine

## 2018-06-26 ENCOUNTER — Ambulatory Visit: Payer: 59 | Attending: Internal Medicine | Admitting: Internal Medicine

## 2018-06-26 VITALS — BP 104/68 | HR 72 | Temp 98.6°F | Wt 144.8 lb

## 2018-06-26 DIAGNOSIS — F3340 Major depressive disorder, recurrent, in remission, unspecified: Secondary | ICD-10-CM | POA: Insufficient documentation

## 2018-06-26 DIAGNOSIS — Z0000 Encounter for general adult medical examination without abnormal findings: Secondary | ICD-10-CM | POA: Insufficient documentation

## 2018-06-26 DIAGNOSIS — R110 Nausea: Secondary | ICD-10-CM | POA: Diagnosis not present

## 2018-06-26 DIAGNOSIS — H8110 Benign paroxysmal vertigo, unspecified ear: Secondary | ICD-10-CM | POA: Insufficient documentation

## 2018-06-26 DIAGNOSIS — E785 Hyperlipidemia, unspecified: Secondary | ICD-10-CM | POA: Insufficient documentation

## 2018-06-26 DIAGNOSIS — Z23 Encounter for immunization: Secondary | ICD-10-CM | POA: Diagnosis present

## 2018-06-26 LAB — LIPID PANEL
Cholesterol: 184 mg/dL (ref 0–239)
HIGH DENSITY LIPOPROTEIN: 46 mg/dL (ref 40–?)
LOW DENSITY LIPOPROTEIN DIRECT: 114 mg/dL (ref 0–189)
TRIGLYCERIDES: 207 mg/dL — ABNORMAL HIGH (ref 0–150)

## 2018-06-26 LAB — HEPATIC FUNCTION PANEL
ALANINE AMINOTRANSFERASE: 25 U/L (ref 12–45)
ALBUMIN: 3.7 g/dL (ref 3.4–5.0)
ALKALINE PHOSPHATASE: 84 U/L (ref 45–117)
ASPARTATE AMINOTRANSFERASE: 23 U/L (ref 8–34)
BILIRUBIN DIRECT: 0.1 mg/dl (ref 0.0–0.2)
BILIRUBIN TOTAL: 0.4 mg/dL (ref 0.2–1.0)
INDIRECT BILIRUBIN: 0.3 mg/dL (ref 0.2–0.9)
TOTAL PROTEIN: 7.3 g/dL (ref 6.4–8.2)

## 2018-06-26 NOTE — Progress Notes (Addendum)
Influenza Vaccine Procedure  June 26, 2018    1. Has the patient received the information for the influenza vaccine? Yes    2. Does the patient have any of the following contraindications?  Allergy to eggs? No  Allergic reaction to previous influenza vaccines? No  Any other problems to previous influenza vaccines? No  Paralyzed by Guillain-Barre syndrome?  No  Current moderate or severe illness? No  Allergy to contact lens solution? No    3. The vaccine has been administered in the usual fashion.     Immunization information reviewed. Current VIS reviewed and given to patient/ guardian. Verbal assent obtained from patient/ guardian.  See immunization/Injection module or chart review for date of publication and additional information. Verbal assent obtained from patient/guardian. Comfort measures for possible side effects reviewed.

## 2018-06-26 NOTE — Addendum Note (Signed)
Addended byOrlene Erm on: 06/26/2018 09:34 AM     Modules accepted: Orders, SmartSet

## 2018-06-26 NOTE — Progress Notes (Signed)
Cc: PE, vertigo, nausea & follow up depression    Vertigo  She notes ~2 weeks of feeling a sensation of spinning if she moves quickly  This lasts a few seconds, not associated with nausea or vomiting  No hearing loss or tinnitus  The patient denies any symptoms of neurological impairment or TIA's; no amaurosis, diplopia, dysphasia, or unilateral disturbance of motor or sensory function. No loss of balance or vertigo.     Nausea  She has a low level of intermittent nausea over the past month  Does not feel epigastric burning nor have sour taste in her mouth  No new meds, is not taking the omega-3 fish oil supplement on her med list below  Not associated with eating, her appetite is good, not losing weight   No abd pain, no fevers, chills, vomitting or other associated symptoms  The patient denies abdominal pain, anorexia, vomiting, dysphagia, odynophagia, change in bowel habits or black or bloody stools.      Depression  She notes that she's compliant with her SSRI, she feels that her mood symptoms are relatively well controlled  Please see PHQ-9 for additional details.   She denies thoughts of death, suicidal thoughts or suicide attempts       Most Recent Weight Reading(s)  06/26/18 : 65.7 kg (144 lb 12.8 oz)  03/27/18 : 64 kg (141 lb)  12/19/17 : 64.9 kg (143 lb)  08/30/17 : 63.5 kg (140 lb)  05/30/17 : 65.8 kg (145 lb)      Patient Active Problem List    Major depressive disorder, recurrent, in remission ([place])         Priority: High [1]         Date Noted: 08/31/2004            10/00 to Psych on Paxil, self d/c'd;            9/00 head CT: scattered punctate calcifications                        Wants to start medication again because she feels            anxious and sad12/18 She notes that she is not            exercising or seeing therapist - but is             taking her fluoxetine as prescribed      Left foot pain         Priority: Medium [2]         Date Noted: 10/12/2017            1/19 podiatry:  Grade 0 plantar plate disruption of            the left 2nd digit.- X-rays were performed to rule            out fracture or biomechanical deformity- She was            prescribed OTC orthotics with metatasal pads with            Insole store- NSAIDS were prescribed to be used as            needed for pain       Subclinical hypothyroidism         Priority: Medium [2]         Date Noted: 05/13/2017  8/18 had thyroid studies in [place], found to have            very slightly elevated             TSH .6.5, and a normal free T4,            Will repeat in 1 year      Reactive airway disease         Priority: Medium [2]         Date Noted: 01/24/2017            Uses inhaler when she develops a viral illness      BMI 26.0-26.9,adult         Priority: Medium [2]         Date Noted: 09/08/2015      Varicose veins of lower extremity         Priority: Medium [2]         Date Noted: 07/09/2013            Overview:             Varicose veins of lower extremity      Female stress incontinence         Priority: Medium [2]         Date Noted: 02/27/2010      IBS (irritable bowel syndrome)         Priority: Medium [2]         Date Noted: 04/17/2007            10/10 normal colonoscopy       h/o Abnormal CXR, resolved         Priority: Low [3]         Date Noted: 10/05/2017            12/17 2 somewhat ill-defined 13 mm nodular            densities on the  right. Would             recommend chest CT scan for further evaluation.                         1/19 repeat CXR wnl      Seasonal allergic rhinitis         Priority: Low [3]         Date Noted: 01/27/2015            2018 Uses nasal corticosteroids       Cervical radiculopathy         Priority: Low [3]         Date Noted: 03/17/2012            6/10/131. Mild degenerative changes of the            cervical spine extending from C3-C4 to C5-C6            levels with a disc space narrowing and marginal            osteophytes. 2. Mild reversal of the normal            cervical  lordosis which may be             due to             positioning or muscle spasm.  Family history of diabetes mellitus type II         Priority: Low [3]         Date Noted: 12/28/2010            2016 normal HgA1c       Cysticercosis of central nervous system         Priority: Low [3]         Date Noted: 12/29/2009            Found incidentally on CT scan.  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 - had negative CXR pp [facility] referred            to TB clinic       Adaysha, [facility] Medication Instructions [id number]    Printed on:06/26/18 0907   Medication Information                      Cholecalciferol (VITAMIN D3) 400 units tablet  Take 1 tablet by mouth daily             ESTRING 2 MG vaginal ring  PLACE 2 MILLIGRAM VAGINALLY EVERY 3 MONTHS             FLUoxetine (PROZAC) 20 MG capsule  Take 2 capsules by mouth daily             fluticasone (FLONASE) 50 MCG/ACT nasal spray  2 sprays by Each Nostril route daily             Multiple Vitamin (MULTIVITAMIN) TABS  Take 1 tablet by mouth daily.             omega-3 acid ethyl esters (LOVAZA) 1 GM capsule  Take 2 g by mouth 2 (two) times daily.             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      Comment:  treated [facility] 11/97 CSF VDRL neg 9/00 RPR  9/00 1:2 repeat                RPR yearly   08/31/2004: Nonspecific abnormal results of thyroid function study      Comment:  increased TSH 4.19 6/00; TSH nl 9/00; 05/04/99 - wnl  04/23/2009: Presbyopia  04/23/2009: PVD (posterior vitreous detachment)  08/31/2004: Trichomoniasis, unspecified      Comment:  10/97  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      Comment:  +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 - had negative CXR pp at [facility] referred to TB               clinic  08/31/2004: Urinary tract infection, site not specified      Comment:  4/02, 6/02  Past Surgical History:  No date: BREAST BIOPSY & NEEDLE LOC WIR  No date: EXCISION EXCESSIVE SKIN & SUBQ TISSUE ABDOMEN      Comment:  abdominoplasty in [place]  1998: LIG/TRNSXJ FLP TUBE ABDL/VAG APPR UNI/BI      Comment:  Tubal Ligation  No date: OB ANTEPARTUM CARE CESAREAN DLVR & POSTPARTUM      Comment:  c/section x 2  2004: REDUCTION MAMMAPLASTY  No date: TRURL RF FEMALE BLADDER NECK STRS URIN INCONT  Social History     Socioeconomic History    Marital status: Divorced     Spouse name: Not on file    Number of children: 3    Years of education: Not on file    Highest education level: Not on file   Occupational History    Occupation: Teacher, early years/pre: [place] resource strain: Not on file    Food insecurity:     Worry: Not on file     Inability: Not on file    Transportation needs:     Medical: Not on file     Non-medical: Not on file   Tobacco Use    Smoking status: Former Smoker     Start date: 10/05/2003     Last attempt to quit: 08/10/2004     Years since quitting: 13.8    Smokeless tobacco: Never Used    Tobacco comment: smoked only for 6 months.   Substance and Sexual Activity    Alcohol use: Yes     Comment: minimal    Drug  use: No    Sexual activity: Yes     Partners: Male     Birth control/protection: Surgical     Comment: men age 60 q 63, menorrhagia 2006, past history of syphillis and rx , 1st SA age 55, 52 lietime partners, separated again from her husband.   Lifestyle    Physical activity:     Days per week: Not on file     Minutes per session: Not on file    Stress: Not on file   Relationships    Social connections:     Talks on phone: Not on file     Gets together: Not on file     Attends religious service: Not on file     Active member of club or organization: Not on file     Attends meetings of clubs or organizations: Not on file     Relationship status: Not on file    Intimate partner violence:     Fear of current or ex partner: Not on file     Emotionally abused: Not on file     Physically abused: Not on file  Forced sexual activity: Not on file   Other Topics Concern    Military Service No    Blood Transfusions No    Caffeine Concern Not Asked    Occupational Exposure No    Hobby Hazards No    Sleep Concern No    Stress Concern No    Weight Concern Yes    Special Diet No    Back Care Not Asked    Exercise No     Comment: not much    Bike Helmet Not Asked    Seat Belt Yes    Self-Exams Not Asked   Social History Narrative    To [place] from [place] in 1991    In [place] did not graduate from high school     Didn't work in [place] & then 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  Daughter Christ Kick is starting at [employer] in Sept. Pt also has 2 older sons. Husb Jose. Denies DV.    Mood is fine, no psych issues.        Lives w/ husband & 2 children     Review of patient's family history  indicates:  Problem: Cancer - Other      Relation: Father          Age of Onset: (Not Specified)          Comment: lung & skin cancer  Problem: Diabetes      Relation: Mother          Age of Onset: (Not Specified)  Problem: Hypertension      Relation: Mother          Age of Onset: (Not Specified)     BP 104/68  Pulse 72  Temp 98.6 F (37 C)  Wt 65.7 kg (144 lb 12.8 oz)  LMP 11/02/2009  SpO2 100%  BMI 26.48 kg/m2  General:Patient appears well, alert and oriented x 3, pleasant, cooperative.   Eyes: anicteric conjunctiva, PERLA bilaterally. No nystagmus  Ears: tm's wnl bilaterally without erythema or exudate. Hearing intact to finger rub.  Oropharynx: no erythema or exudate.   Neck supple and free of adenopathy, or masses. No thyromegaly.  Lymph: no enlargement of anterior cervical, posterior cervical or supraclavicular lymph nodes   Cardiovascular: regular rate & rhythm, no murmurs, gallops or rubs appreciated.   Chest: clear to auscultation bilaterally, no crackles, rhonchi or wheezes.   Abdomen is soft, no tenderness, masses or organomegaly.  Extremities: no clubbing, cyanosis or edema.    Skin is normal without suspicious lesions noted.  Mental status exam: Normal thought content, speech, affect, mood and dress are noted.  Musculoskeletal exam: within normal limits without joint swelling or deformities  Screening neurological exam is normal without focal findings.     ASSESSMENT & PLAN:  (Z00.00) Routine general medical examination at a health care facility  (primary encounter diagnosis)  Comment: reviewed all routine health maintenance issues  Plan: LIPID PANEL, COLLECTION VENOUS BLOOD         VENIPUNCTURE            (  F33.40) Major depressive disorder, recurrent, in remission ([place])  Comment: well controlled continue current regimen     (R11.0) Nausea  Comment: no clear etiology from hx, but also no alarm symptoms, will check LFT's & observe - at this point no indication for additional workup altho' this was  considered - etiologies including med side effects, mild reflux, anxiety  Cancer & other more serious etiologies much less likely.  Plan: HEPATIC FUNCTION PANEL            (H81.10) Benign paroxysmal positional vertigo, unspecified laterality  Comment: Although other causes of vertigo were considered such as labyrinthitis, Menierre's disease and TIA/stroke given the history of symptoms as well as many of the pertinent negatives above these other diagnoses are so remote as to not merit further evaluation at this time (however, if her symptoms progress will need to be considered again).  I believe that she has benign positional vertigo.  I have discussed with her the diagnosis and treatment including to try to avoid sudden movements.  She understands that if the symptoms are worsening she should call because there are specialty centers which can do certain maneuvers which may be able to relieve the symptoms.  Did not prescribe medicine today.   Will call if not improving in 6 weeks.         The patient was ready to learn and no apparent learning or adherence barriers were identified. I explained the diagnosis and treatment plan, and the patient expressed understanding of the content. I attempted to answer any questions regarding the diagnosis and the proposed treatment.    Possible side effects of the prescribed medication was explained. We discussed the patients current medications.  We discussed the importance of medication compliance. The patient expressed understanding and no barriers to adherence were identified.      she has been advised to call or return with any worsening or new problems

## 2018-06-28 ENCOUNTER — Encounter (HOSPITAL_BASED_OUTPATIENT_CLINIC_OR_DEPARTMENT_OTHER): Payer: Self-pay | Admitting: Internal Medicine

## 2018-07-03 ENCOUNTER — Encounter (HOSPITAL_BASED_OUTPATIENT_CLINIC_OR_DEPARTMENT_OTHER): Payer: 59 | Admitting: Obstetrics & Gynecology

## 2018-07-03 ENCOUNTER — Ambulatory Visit: Payer: 59 | Attending: Obstetrics & Gynecology | Admitting: Obstetrics & Gynecology

## 2018-07-03 VITALS — BP 108/69 | Wt 147.4 lb

## 2018-07-03 DIAGNOSIS — T192XXD Foreign body in vulva and vagina, subsequent encounter: Secondary | ICD-10-CM | POA: Insufficient documentation

## 2018-07-03 NOTE — Progress Notes (Signed)
S: 57 YO woman here for removal of estring    Pt has been using estring with relief of vag dryness for several years. She is able to place the ring but is not able to remove it. Arrives for removal today    Not due for pap  Has refills    O: appears well  Vulva WNL  Vag pink moist Estring in place    A: Estring in place, difficult for pt to remove  P: Estring removed. Pt will replace later today  Follow up 3 months.

## 2018-07-03 NOTE — Patient Instructions (Signed)
SB 3 months ring removal

## 2018-07-29 ENCOUNTER — Emergency Department
Admission: EM | Admit: 2018-07-29 | Discharge: 2018-07-29 | Disposition: A | Discharge status: Home / Self Care | Payer: 59 | Source: Intra-hospital | Attending: Emergency Medicine | Admitting: Emergency Medicine

## 2018-07-29 ENCOUNTER — Encounter (HOSPITAL_BASED_OUTPATIENT_CLINIC_OR_DEPARTMENT_OTHER): Payer: Self-pay

## 2018-07-29 DIAGNOSIS — J029 Acute pharyngitis, unspecified: Secondary | ICD-10-CM | POA: Diagnosis present

## 2018-07-29 DIAGNOSIS — R05 Cough: Secondary | ICD-10-CM | POA: Diagnosis not present

## 2018-07-29 DIAGNOSIS — J069 Acute upper respiratory infection, unspecified: Secondary | ICD-10-CM | POA: Diagnosis not present

## 2018-07-29 DIAGNOSIS — R0981 Nasal congestion: Secondary | ICD-10-CM

## 2018-07-29 DIAGNOSIS — Z87891 Personal history of nicotine dependence: Secondary | ICD-10-CM

## 2018-07-29 LAB — RAPID STREP (POINT OF CARE): STREP SCREEN: NEGATIVE

## 2018-07-29 MED ORDER — IBUPROFEN 600 MG PO TABS
600.0000 mg | ORAL_TABLET | Freq: Once | ORAL | Status: AC
Start: 2018-07-29 — End: 2018-07-29
  Administered 2018-07-29: 600 mg via ORAL
  Filled 2018-07-29: qty 1

## 2018-07-29 MED ORDER — IBUPROFEN 600 MG PO TABS
600.0000 mg | ORAL_TABLET | Freq: Three times a day (TID) | ORAL | 0 refills | Status: AC | PRN
Start: 2018-07-29 — End: 2018-08-01

## 2018-07-29 MED ORDER — BENZONATATE 100 MG PO CAPS
100.00 mg | ORAL_CAPSULE | Freq: Three times a day (TID) | ORAL | 0 refills | Status: AC | PRN
Start: 2018-07-29 — End: 2018-08-01

## 2018-07-29 MED ORDER — LIDOCAINE VISCOUS HCL 2 % MT SOLN
5.00 mL | Freq: Once | OROMUCOSAL | Status: AC
Start: 2018-07-29 — End: 2018-07-29
  Administered 2018-07-29: 5 mL via OROMUCOSAL
  Filled 2018-07-29: qty 15

## 2018-07-29 MED ORDER — MENTHOL 3 MG MT LOZG
1.00 | LOZENGE | OROMUCOSAL | 0 refills | Status: AC | PRN
Start: 2018-07-29 — End: 2018-08-01

## 2018-07-29 NOTE — ED Provider Notes (Signed)
I have reviewed the ED nursing notes and prior records. I have reviewed the patient's past medical history/problem list, allergies, social history and medication list.  I saw this patient primarily.    HPI:  This 57 year old female patient presents with chief complaint of sore throat, cough, congestion for the past few days. Patient has not been taking anything for her symptoms. Denies fevers. Positive sick contacts with similar sx. No chest pain, sob. No vomiting/diarrhea. Denies difficulty swallowing, no voice changes.    Review of Systems:  Pertinent positives were reviewed as per the HPI above. All other systems were reviewed and are negative.    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      Comment:  treated [facility] 11/97 CSF VDRL neg 9/00 RPR 9/00 1:2 repeat                RPR yearly   08/31/2004: Nonspecific abnormal results of thyroid function study      Comment:  increased TSH 4.19 6/00; TSH nl 9/00; 05/04/99 - wnl  04/23/2009: Presbyopia  04/23/2009: PVD (posterior vitreous detachment)  08/31/2004: Trichomoniasis, unspecified      Comment:  10/97  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      Comment:  +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 - had negative CXR pp at [facility] referred to TB               clinic  08/31/2004: Urinary tract infection, site not specified      Comment:  4/02, 6/02  Patient Active Problem List:     Nonspecific reaction to tuberculin skin test without active tuberculosis     Major depressive disorder, recurrent, in remission ([place])     Mammographic microcalcification, normal BX     IBS (irritable bowel syndrome)     PVD (posterior vitreous detachment)     Cysticercosis of central nervous system     Female stress incontinence     Family history of diabetes mellitus type II     Cervical  radiculopathy     Seasonal allergic rhinitis     BMI 26.0-26.9,adult     Varicose veins of lower extremity     Reactive airway disease     Subclinical hypothyroidism     h/o Abnormal CXR, resolved     Left foot pain      Past Surgical History:  Past Surgical History:  No date: BREAST BIOPSY & NEEDLE LOC WIR  No date: EXCISION EXCESSIVE SKIN & SUBQ TISSUE ABDOMEN      Comment:  abdominoplasty in [place]  1998: LIG/TRNSXJ FLP TUBE ABDL/VAG APPR UNI/BI      Comment:  Tubal Ligation  No date: OB ANTEPARTUM CARE CESAREAN DLVR & POSTPARTUM      Comment:  c/section x 2  2004: REDUCTION MAMMAPLASTY  No date: TRURL RF FEMALE BLADDER NECK STRS URIN INCONT    Medications:     No current facility-administered medications on file prior to encounter.   Current Outpatient Medications on File Prior to Encounter:  Cholecalciferol (VITAMIN D3) 400 units tablet Take 1 tablet by mouth daily Disp: 30 tablet Rfl: 11   ESTRING 2 MG vaginal ring PLACE 2 MILLIGRAM VAGINALLY EVERY 3 MONTHS Disp:  1 each Rfl: 3   FLUoxetine (PROZAC) 20 MG capsule Take 2 capsules by mouth daily Disp: 60 capsule Rfl: 11   PROAIR HFA 108 (90 Base) MCG/ACT inhaler Inhale 1 puff into the lungs every 4 (four) hours as needed for Wheezing Disp: 3 Inhaler Rfl: 0   fluticasone (FLONASE) 50 MCG/ACT nasal spray 2 sprays by Each Nostril route daily Disp: 1 Bottle Rfl: 11   [DISCONTINUED] Estradiol (ESTROGEL) 0.75 MG/1.25 GM (0.06%) topical gel Place 1.25 g onto the skin every 3 (three) days. Disp: 50 g Rfl: 11   Multiple Vitamin (MULTIVITAMIN) TABS Take 1 tablet by mouth daily. Disp:  Rfl:    omega-3 acid ethyl esters (LOVAZA) 1 GM capsule Take 2 g by mouth 2 (two) times daily. Disp:  Rfl:        Social History:  Social History    Tobacco Use      Smoking status: Former Smoker        Start date: 10/05/2003        Quit date: 08/10/2004        Years since quitting: 13.9      Smokeless tobacco: Never Used      Tobacco comment: smoked only for 6 months.    Alcohol use: Yes       Comment: minimal      Allergies:  Review of Patient's Allergies indicates:   Hydrocodone-acetami*    Nausea and Vomiting   Pcn [penicillins]           Comment:Got a urine infection after using    Physical Exam:  ED Triage Vitals [07/29/18 0658]   Enc Vitals Group      BP 107/61      Pulse 78      Resp 18      Temp 97.7 F      Temp src       SpO2 98 %      Weight 63.5 kg      Height       Head Circumference       Peak Flow       Pain Score 7       Pain Loc       Pain Edu?       Excl. in [place]?        GENERAL:  Well-appearing, no distress.  SKIN:  Warm & Dry   HEENT:   Atraumatic. PERRL. EOMI.  Oropharynx erythematous, uvula midline, no tonsillar exudates, moist mucous membranes  NECK:  Supple, cervical LAD  LUNGS:  Clear to auscultation bilaterally    HEART:  RRR.  ABDOMEN:  Soft, nt  MUSCULOSKELETAL:  No deformities. Well-perfused extremities. No cyanosis or edema.  NEUROLOGIC: no focal deficits  PSYCHIATRIC:  Normal affect, calm & cooperative    ED Course and Medical Decision-making:    Pertinent labs and imaging studies reviewed.  Emergency Department nursing record was.  Prior records as available electronically through the [facility] record were reviewed.    In brief this is a 57 year old female presenting with sore throat, URI sx for about one week. She is well appearing, afebrile. No concerning signs/symptoms for deep space neck infection. Strep negative. Given supportive care, viscous lidocaine and ibuprofen in the ed, will discharge with cepacol lozenges, tessalon perles, and supportive care instructions/ibuprofen and tylenol as prescribed.      The reasons to return to the emergency department were reviewed in detail with the patient.   Patient appeared to understand  and be in agreement with this treatment plan and disposition    Disposition: home  Condition on ED departure:  Improved and stable  PCP:  [name], MD    Diagnosis/Diagnoses:  Pharyngitis  URI    Renne Musca, [place]

## 2018-07-29 NOTE — ED Triage Note (Signed)
Pt reports x1 wk, URI symptoms. Reports extreme throat pain and can feel bumps. (+) sick contacts. (-) fevers

## 2018-07-29 NOTE — Narrator Note (Signed)
Patient Disposition    Patient education for diagnosis, medications, activity, diet and follow-up.  Patient left ED 7:39 AM.  Patient rep received written instructions.  Interpreter to provide instructions: No    Patient belongings with patient: YES    Have all existing LDAs been addressed? N/A    Have all IV infusions been stopped? N/A    Discharged to: Discharged to home w/ daughter. Verbalizes understanding of d/c instructions, prescribed meds, f/u and when to return

## 2018-07-30 MED ORDER — CEPHALEXIN 500 MG PO CAPS
500.00 mg | ORAL_CAPSULE | Freq: Two times a day (BID) | ORAL | 0 refills | Status: AC
Start: 2018-07-30 — End: 2018-08-09

## 2018-07-31 LAB — THROAT CULTURE BETA STREP

## 2018-08-07 ENCOUNTER — Ambulatory Visit (HOSPITAL_BASED_OUTPATIENT_CLINIC_OR_DEPARTMENT_OTHER): Payer: Self-pay | Admitting: Ambulatory Care

## 2018-08-07 NOTE — Telephone Encounter (Signed)
Regarding: sore throat   ----- Message from Darliss Cheney sent at 08/07/2018  3:42 PM EST -----  Loretta Martin [id number], 57 year old, female    Calls today:  Sick    What are the symptoms? Pt was seen at [facility] ED for Sore/ swollen throat and found out she tested positive for strep. Pt was given antibiotics but isn't feeling any better.   How long has patient been sick? 1 week  What has pt. tried at home? Antibiotics   Person calling on behalf of patient: Patient (self)    CALL BACK NUMBER: [phone]   Best time to call back:   Cell phone:   Other phone:    Patient's language of care: English    Patient does not need an interpreter.    Patient's PCP: [name], MD

## 2018-08-07 NOTE — Telephone Encounter (Signed)
Spoke with pt. Seen in ED for sore throat and started on cephalexin- says is a little better. Hurts to swallow but able to eat and drink. Comes and goes since Oct 20. "Problem is throat hurts a lot." Asking to be seen today- explained is 4PM and no availability tonight we close at 5  When pain is bad she feels sob and uses albuterol inhaler which helps. Confirmed not new been ongoing. Says Dr.Green gave to her in the past for asthma triggered by allergy.  Offered appt tomorrow AM declined. Given with pcp Wed, understands is to call asap if symptoms change or worsen

## 2018-08-08 ENCOUNTER — Other Ambulatory Visit (HOSPITAL_BASED_OUTPATIENT_CLINIC_OR_DEPARTMENT_OTHER): Payer: Self-pay | Admitting: Internal Medicine

## 2018-08-08 DIAGNOSIS — J301 Allergic rhinitis due to pollen: Secondary | ICD-10-CM

## 2018-08-08 MED ORDER — FLUTICASONE PROPIONATE 50 MCG/ACT NA SUSP: 2 | Bottle | Freq: Every day | NASAL | 11 refills | 0 days | Status: AC

## 2018-08-08 MED ORDER — FLUTICASONE PROPIONATE 50 MCG/ACT NA SUSP
2.0000 | Freq: Every day | NASAL | 11 refills | Status: DC
Start: 2018-08-08 — End: 2020-01-16

## 2018-08-08 NOTE — Progress Notes (Signed)
[facility] INTERNAL MED    Person calling on behalf of patient: Patient (self)    May list multiple medications in this section    Medicine Name: [facility]    Dosage:     Frequency (how many pills, how many times a day):     Number of pills left:     Documented patient preferred pharmacies:   [facility] [place] [facility] [place]  Phone: [phone] Fax: [phone]        Patient's language of care: English    Patient needs a [place] interpreter.

## 2018-08-08 NOTE — Progress Notes (Signed)
PER Patient (self), Loretta Martin is a 57 year old female has requested a refill of Flonase Nasal Spray.      Last Office Visit: 06/26/18 with Rosalie Doctor  Last Physical Exam: 06/26/18    There are no preventive care reminders to display for this patient.    Other Med Adult:  Most Recent BP Reading(s)  07/29/18 : 107/61        Cholesterol (mg/dL)   Date Value   06/26/2018 184     LOW DENSITY LIPOPROTEIN DIRECT (mg/dL)   Date Value   06/26/2018 114     HIGH DENSITY LIPOPROTEIN (mg/dL)   Date Value   06/26/2018 46     TRIGLYCERIDES (mg/dL)   Date Value   06/26/2018 207 (H)         THYROID SCREEN TSH REFLEX FT4 (uIU/mL)   Date Value   11/01/2016 3.660         TSH (THYROID STIM HORMONE) (uIU/mL)   Date Value   05/26/2016 8.300 (H)       HEMOGLOBIN A1C (%)   Date Value   05/26/2016 5.5       No results found for: POCA1C      INR (no units)   Date Value   05/12/2009 1.0 (L)       SODIUM (mmol/L)   Date Value   09/19/2017 142       POTASSIUM (mmol/L)   Date Value   09/19/2017 5.0           CREATININE (mg/dL)   Date Value   09/19/2017 0.7       Documented patient preferred pharmacies:    [facility] [phone] - [place], [place] - [place]  Phone: [phone] Fax: [phone]

## 2018-08-09 ENCOUNTER — Encounter (HOSPITAL_BASED_OUTPATIENT_CLINIC_OR_DEPARTMENT_OTHER): Payer: Self-pay | Admitting: Internal Medicine

## 2018-08-09 ENCOUNTER — Ambulatory Visit: Payer: 59 | Attending: Internal Medicine | Admitting: Internal Medicine

## 2018-08-09 VITALS — BP 118/66 | HR 70 | Temp 98.2°F | Ht 62.3 in | Wt 138.0 lb

## 2018-08-09 DIAGNOSIS — Z1239 Encounter for other screening for malignant neoplasm of breast: Secondary | ICD-10-CM | POA: Diagnosis not present

## 2018-08-09 DIAGNOSIS — K589 Irritable bowel syndrome without diarrhea: Secondary | ICD-10-CM | POA: Insufficient documentation

## 2018-08-09 DIAGNOSIS — J301 Allergic rhinitis due to pollen: Secondary | ICD-10-CM | POA: Insufficient documentation

## 2018-08-09 DIAGNOSIS — J020 Streptococcal pharyngitis: Secondary | ICD-10-CM | POA: Insufficient documentation

## 2018-08-09 DIAGNOSIS — F3340 Major depressive disorder, recurrent, in remission, unspecified: Secondary | ICD-10-CM | POA: Insufficient documentation

## 2018-08-09 DIAGNOSIS — E039 Hypothyroidism, unspecified: Secondary | ICD-10-CM | POA: Diagnosis present

## 2018-08-09 DIAGNOSIS — E038 Other specified hypothyroidism: Secondary | ICD-10-CM

## 2018-08-09 MED ORDER — FLUTICASONE PROPIONATE 50 MCG/ACT NA SUSP: 2 | Bottle | Freq: Every day | NASAL | 11 refills | 0 days | Status: AC

## 2018-08-09 MED ORDER — FLUTICASONE PROPIONATE 50 MCG/ACT NA SUSP
2.00 | Freq: Every day | NASAL | 11 refills | Status: AC
Start: 2018-08-09 — End: 2020-01-16

## 2018-08-09 NOTE — Progress Notes (Signed)
Cc: follow up multiple issues    Depression  She notes that the fluoxetine is helping, taking it regularly  symptoms have been intermittent stress & anxiety but nothing very strong  She denies thoughts of death, suicidal thoughts or suicide attempts       H/o subclinical hypothyroidism  Most recently TSH was normal.  No symptoms of hypo or hyperthyroidism: no decreased or increased weight, no feeling cold/chilly or excessively warm, no diarrhea or constipation, no undue sweatiness, anxiety or palpitations.    irritable bowel syndrome   She notes that since starting the antibiotics for the strep infection she's had to defecate multiple times / day  But no pain, blood in stools or other changes    Non-group strep, given that she's allergic to PCN she was started on cephalexin (Keflex)   She's been taking that with only the slight change in her bowels, will complete course  She denies fevers, sore throat much better, just feels a bit irritated      Rhinitis  She notes that she was not able to pick up her nasal corticosteroids & that she's feeling particularly congested      ROS: No fevers or unexplained weight loss. No new headaches. No shortness of breath or chest pain.     BP 118/66 (Site: RA, Position: Sitting, Cuff Size: Reg)  Pulse 70  Temp 98.2 F (36.8 C) (Oral)  Ht 5' 2.3" (1.582 m)  Wt 62.6 kg (138 lb)  LMP 11/02/2009  SpO2 100%  BMI 25 kg/m2  Ear exam - both sides normal, TM intact without perforation or effusion, external canal normal. No significant cerumenosis noted.   Nasal exam; septum midline, no deformities, nares patent, normal mucosa without swelling, no polyps, no bleeding.   Throat:  Oral cavity, tongue, pharynx and palate have no inflammation, exudates or ulcers.   Heart: S1 and S2 normal, no murmurs, clicks, gallops or rubs. Regular rate and rhythm.   Lungs:  clear; no wheezes, rhonchi or rales.  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  Comment: no symptoms to suggest need to repeat TSH at present      (K58.9) Irritable bowel syndrome without diarrhea  Comment: we discussed the relationship between the antibiotics & her bowels, considered probiotics but the data is not strong enough to make this standard as might delay return of normal flora      (J02.0) Strep pharyngitis  Comment: will complete course of antibiotics     (Z12.39) Breast cancer screening  We discussed the pros and cons of screening mammo -- we discussed that there are real benefits in terms of decrease risk of dying from breast cancer but there's a cost which is many procedures & treatments, many of which might not be necessary. So, if she wishes to do everything to decrease her risk of dying from breast cancer -- then I would recommend a mammogram. But she should not feel guilty if she decided not to pursue mammograms at this time.  We also discussed the option of performing mammograms q61yr or q50yr and that she can change her mind at any time  in the future.  After detailed discussion & answering all her questions, she decided to check today.   Plan: [place] SCREENING MAMMO BILATERAL DIGITAL WITH DBT &        CAD              The patient was ready to learn and no apparent learning or adherence barriers were identified. I explained the diagnosis and treatment plan, and the patient expressed understanding of the content. I attempted to answer any questions regarding the diagnosis and the proposed treatment.    Possible side effects of the prescribed medication was explained. We discussed the patients current medications.  We discussed the  importance of medication compliance. The patient expressed understanding and no barriers to adherence were identified.    follow-up will be scheduled for 6 months from now  she has been advised to call or return with any worsening or new problems

## 2018-08-21 ENCOUNTER — Ambulatory Visit (HOSPITAL_BASED_OUTPATIENT_CLINIC_OR_DEPARTMENT_OTHER): Payer: Self-pay

## 2018-08-25 ENCOUNTER — Encounter (HOSPITAL_BASED_OUTPATIENT_CLINIC_OR_DEPARTMENT_OTHER): Payer: Self-pay

## 2018-09-25 ENCOUNTER — Encounter (HOSPITAL_BASED_OUTPATIENT_CLINIC_OR_DEPARTMENT_OTHER): Payer: Self-pay | Admitting: Obstetrics & Gynecology

## 2018-09-25 ENCOUNTER — Ambulatory Visit: Payer: 59 | Attending: Obstetrics & Gynecology | Admitting: Obstetrics & Gynecology

## 2018-09-25 VITALS — BP 105/70 | Wt 138.4 lb

## 2018-09-25 DIAGNOSIS — N760 Acute vaginitis: Secondary | ICD-10-CM | POA: Diagnosis not present

## 2018-09-25 DIAGNOSIS — B9689 Other specified bacterial agents as the cause of diseases classified elsewhere: Secondary | ICD-10-CM | POA: Diagnosis present

## 2018-09-25 LAB — VAGINAL KOH (POINT OF CARE): WHIFF TEST: POSITIVE

## 2018-09-25 LAB — VAGINAL PH (POINT OF CARE): VAGINAL PH: 6

## 2018-09-25 MED ORDER — METRONIDAZOLE 0.75 % VA GEL: 1 | g | Freq: Every evening | VAGINAL | 0 refills | 0 days | Status: AC

## 2018-09-25 MED ORDER — METRONIDAZOLE 0.75 % VA GEL
1.00 | Freq: Every evening | VAGINAL | 0 refills | Status: AC
Start: 2018-09-25 — End: 2018-10-02

## 2018-09-25 NOTE — Patient Instructions (Signed)
SB 3 months for ring removal

## 2018-09-25 NOTE — Progress Notes (Signed)
57 year old presents for evaluation of vaginal symptoms.  She reports dishcarge as being normal appearing and malodorous  Urinary symptoms: Negative  Duration of symptoms: months, forgot to mention last visit 3 months ago     She has not tried anything  for symptom relief.  Sexual Histrory: sexually active, monogamous, female partner, denies risk for pregnancy and postmenopausal, using Estring  Contraception: none and n/a  History of previous vaginal infections: Negative  Chronic medical conditions: asthma  Hydrocodone-Acetaminophen; Pcn [Penicillins]      EXAM:      PELVIC:  External Genitalia: normal architecture  Vagina: well rugated and no lesions  Vaginal Discharge: moderate, creamy and malodorous  Pelvic supports: normal  Cervix: no lesions  Uterus: anteverted, normal size and non-tender  Adnexa: no masses, nodularity, tenderness  Anus and perineum: normal  Vaginal PH: 6  Wet prep: clue cells and positive whiff    ASSESSMENT:  BV  Remove vag ring    PLAN:  Medication(s) prescribed Metrogel vaginal x 7 nights  Estring removed easily, pt will replace with new ring today and return in 3 months for removal.

## 2018-11-15 ENCOUNTER — Encounter (HOSPITAL_BASED_OUTPATIENT_CLINIC_OR_DEPARTMENT_OTHER): Payer: Self-pay | Admitting: Obstetrics & Gynecology

## 2018-12-11 ENCOUNTER — Ambulatory Visit: Payer: 59 | Attending: Internal Medicine | Admitting: Internal Medicine

## 2018-12-11 ENCOUNTER — Encounter (HOSPITAL_BASED_OUTPATIENT_CLINIC_OR_DEPARTMENT_OTHER): Payer: Self-pay | Admitting: Internal Medicine

## 2018-12-11 VITALS — BP 117/76 | HR 92 | Temp 97.8°F | Ht 61.81 in | Wt 143.0 lb

## 2018-12-11 DIAGNOSIS — Z0000 Encounter for general adult medical examination without abnormal findings: Secondary | ICD-10-CM

## 2018-12-11 DIAGNOSIS — M79604 Pain in right leg: Secondary | ICD-10-CM

## 2018-12-11 DIAGNOSIS — E038 Other specified hypothyroidism: Secondary | ICD-10-CM

## 2018-12-11 DIAGNOSIS — R42 Dizziness and giddiness: Secondary | ICD-10-CM | POA: Diagnosis not present

## 2018-12-11 DIAGNOSIS — E039 Hypothyroidism, unspecified: Secondary | ICD-10-CM

## 2018-12-11 DIAGNOSIS — F3340 Major depressive disorder, recurrent, in remission, unspecified: Secondary | ICD-10-CM | POA: Diagnosis present

## 2018-12-11 LAB — CBC WITH PLATELET
ABSOLUTE NRBC COUNT: 0 10*3/uL (ref 0.0–0.0)
HEMATOCRIT: 37.2 % (ref 34.1–44.9)
HEMOGLOBIN: 12.3 g/dL (ref 11.2–15.7)
MEAN CORP HGB CONC: 33.1 g/dL (ref 31.0–37.0)
MEAN CORPUSCULAR HGB: 31.2 pg (ref 26.0–34.0)
MEAN CORPUSCULAR VOL: 94.4 fl (ref 80.0–100.0)
MEAN PLATELET VOLUME: 11.6 fL (ref 8.7–12.5)
NRBC %: 0 % (ref 0.0–0.0)
PLATELET COUNT: 154 10*3/uL (ref 150–400)
RBC DISTRIBUTION WIDTH STD DEV: 43.8 fL (ref 35.1–46.3)
RED BLOOD CELL COUNT: 3.94 M/uL (ref 3.90–5.20)
WHITE BLOOD CELL COUNT: 6.7 10*3/uL (ref 4.0–11.0)

## 2018-12-11 LAB — BASIC METABOLIC PANEL
ANION GAP: 6 mmol/L (ref 5–15)
BUN (UREA NITROGEN): 19 mg/dL — ABNORMAL HIGH (ref 7–18)
CALCIUM: 9.4 mg/dL (ref 8.5–10.1)
CARBON DIOXIDE: 27 mmol/L (ref 21–32)
CHLORIDE: 107 mmol/L (ref 98–107)
CREATININE: 0.7 mg/dL (ref 0.4–1.2)
ESTIMATED GLOMERULAR FILT RATE: >60 mL/min (ref >60)
Glucose Random: 107 mg/dL (ref 74–160)
POTASSIUM: 4 mmol/L (ref 3.5–5.1)
SODIUM: 140 mmol/L (ref 136–145)

## 2018-12-11 LAB — FREE THYROXINE: FREE THYROXINE: 0.83 ng/dL (ref 0.76–1.46)

## 2018-12-11 LAB — THYROID SCREEN TSH REFLEX FT4: THYROID SCREEN TSH REFLEX FT4: 5.68 u[IU]/mL — ABNORMAL HIGH (ref 0.358–3.740)

## 2018-12-11 MED ORDER — NAPROXEN 500 MG PO TABS
500.00 mg | ORAL_TABLET | Freq: Two times a day (BID) | ORAL | 0 refills | Status: AC
Start: 2018-12-11 — End: 2018-12-18

## 2018-12-11 MED ORDER — NAPROXEN 500 MG PO TABS: 500 mg | tablet | Freq: Two times a day (BID) | ORAL | 0 refills | 0 days | Status: AC

## 2018-12-11 NOTE — Progress Notes (Signed)
[name] -- PHYSICAL EXAM    CC: Patient presents with:  Establish Care      HPI:     Loretta Martin is a 58 year old woman who presents for a physical exam.    Right leg pain. Could it be related to an inguinal hernia she has on the left? Feels like it's in the muscle. Happens randomly.    Dizzy this morning. Room spinning. When she woke up. Didn't feel like she would pass out. Feels fine now.    A complete review of systems was performed and was otherwise negative.      History:    Past medical history, social history, and family history reviewed and updated in EMR. Notable for     Patient Active Problem List:     Nonspecific reaction to tuberculin skin test without active tuberculosis     Major depressive disorder, recurrent, in remission ([place])     Mammographic microcalcification, normal BX     PVD (posterior vitreous detachment)     Asymptomatic neurocysticercosis     Female stress incontinence     Family history of diabetes mellitus type II     Seasonal allergic rhinitis     BMI 26.0-26.9,adult     Varicose veins of lower extremity     Subclinical hypothyroidism      Social History    Tobacco Use      Smoking status: Former Smoker        Start date: 10/05/2003        Quit date: 08/10/2004        Years since quitting: 14.3      Smokeless tobacco: Never Used      Tobacco comment: smoked only for 6 months.    Alcohol use: Yes      Comment: minimal    Drug use: No      Review of patient's family history indicates:  Problem: Cancer - Other      Relation: Father          Age of Onset: (Not Specified)          Comment: lung & skin cancer  Problem: Diabetes      Relation: Mother          Age of Onset: (Not Specified)  Problem: Hypertension      Relation: Mother          Age of Onset: (Not Specified)      Social History    Social History Narrative      To [place] from [place] in [place]. 3 children, all adults. Husband Jose. Prior [facility] patient.      Medications: See  below.    Allergies: Reviewed and updated in EMR.    PHYSICAL EXAM:    VITALS: BP 117/76 (Site: RA, Position: Sitting, Cuff Size: Reg)  Pulse 92  Temp 97.8 F (36.6 C) (Temporal)  Ht 5' 1.81" (1.57 m)  Wt 64.9 kg (143 lb)  LMP 11/02/2009  SpO2 95%  BMI 26.32 kg/m2    GENERAL: Well-developed, well-nourished and in no acute distress.  EYES: Eyelids without rash or discharge. Conjunctivae without injection, discharge, or icterus. Pupils equally round and reactive to light.  HENT: Normocephalic and atraumatic. Hearing grossly intact. No nasal discharge. Mucous membranes moist with no lesions in oropharynx. Gingiva pink and healthy with good dentition.  NECK: Neck supple. No deformities or appreciable masses.  CARDIAC: Normal S1/S2 with no murmurs, rubs, or gallops. No  jugular venous distension. Extremities warm and well-perfused with no peripheral edema and capillary refill < 2 seconds.  PULM: Normal work of breathing. Clear to auscultation bilaterally.  GI: Soft, non-tender and non-distended with bowel sounds present. No appreciable hepatosplenomegaly.  SKIN: Skin dry and intact. No rashes or lesions.  NEUROLOGIC: CN II-XII grossly intact. Normal bulk and tone and moving all four extremities. Reflexes 2+ and symmetric at patella.  MSK: Normal gait and station. No deformity, tenderness, or decreased ROM in the head, neck, back, pelvis and upper and lower extremities. Mild TTP in quad on right.  LYMPH: No appreciable lymphadenopathy in head and neck or axilla.  PSYCH: Oriented to person, place and time. Mood good and affect congruent. Appropriate judgment and insight.      ASSESSMENT AND PLAN:    Loretta Martin is a 58 year old woman presenting for a physical exam:      Physical exam  - Mammogram ordered  - Otherwise UTD    Right leg pain  Muscular, likely related to work.  -     naproxen (NAPROSYN) 500 MG tablet; Take 1 tablet by mouth 2 (two) times daily with meals  for 7 days  - F/u  prn    Dizziness  Subclinical hypothyroidism  Happened once -- will do labs out of abundance of precaution.  -     BASIC METABOLIC PANEL  -     CBC WITH PLATELET  -     THYROID SCREEN TSH REFLEX FT4      Major depressive disorder, recurrent, in remission ([place])  - Continue fluoxetine      I explained the assessment and plan for treatment, and she expressed understanding. I answered all questions. No learning or adherence barriers were identified.    All laboratory studies, imaging, and consultant notes since the last physical exam were reviewed. Past medical history, social history, and family history reviewed and updated in EMR as above.     Medications at the end of encounter:    Patient's Medications   New Prescriptions    NAPROXEN (NAPROSYN) 500 MG TABLET    Take 1 tablet by mouth 2 (two) times daily with meals  for 7 days   Previous Medications    CHOLECALCIFEROL (VITAMIN D3) 400 UNITS TABLET    Take 1 tablet by mouth daily    ESTRING 2 MG VAGINAL RING    PLACE 2 MILLIGRAM VAGINALLY EVERY 3 MONTHS    FLUOXETINE (PROZAC) 20 MG CAPSULE    Take 2 capsules by mouth daily    FLUTICASONE (FLONASE) 50 MCG/ACT NASAL SPRAY    2 sprays by Each Nostril route daily    FLUTICASONE (FLONASE) 50 MCG/ACT NASAL SPRAY    2 sprays by Each Nostril route daily    MULTIPLE VITAMIN (MULTIVITAMIN) TABS    Take 1 tablet by mouth daily.    OMEGA-3 ACID ETHYL ESTERS (LOVAZA) 1 GM CAPSULE    Take 2 g by mouth 2 (two) times daily.    PROAIR HFA 108 (90 BASE) MCG/ACT INHALER    Inhale 1 puff into the lungs every 4 (four) hours as needed for Wheezing   Modified Medications    No medications on file   Discontinued Medications    NAPROXEN (NAPROSYN) 500 MG TABLET    Take 1 tablet by mouth every 12 (twelve) hours as needed for Pain as needed for pain. Take with food

## 2018-12-11 NOTE — Patient Instructions (Signed)
[id number] -- MAMMOGRAM

## 2018-12-13 ENCOUNTER — Encounter (HOSPITAL_BASED_OUTPATIENT_CLINIC_OR_DEPARTMENT_OTHER): Payer: Self-pay | Admitting: Internal Medicine

## 2018-12-13 DIAGNOSIS — E038 Other specified hypothyroidism: Secondary | ICD-10-CM

## 2018-12-13 DIAGNOSIS — E039 Hypothyroidism, unspecified: Principal | ICD-10-CM

## 2018-12-20 ENCOUNTER — Telehealth (HOSPITAL_BASED_OUTPATIENT_CLINIC_OR_DEPARTMENT_OTHER): Payer: Self-pay

## 2018-12-20 NOTE — Progress Notes (Signed)
Called patient to ask if she fells comfortable waiting until June. Patient states that can't remove ring herself.     Should we keep 12/29/18 visit? Pt has been advised if front desk is advised differently pt will be called.    Please advise.

## 2018-12-21 NOTE — Progress Notes (Signed)
I will see her 

## 2018-12-25 ENCOUNTER — Encounter (HOSPITAL_BASED_OUTPATIENT_CLINIC_OR_DEPARTMENT_OTHER): Payer: Self-pay | Admitting: Obstetrics & Gynecology

## 2018-12-26 ENCOUNTER — Ambulatory Visit (HOSPITAL_BASED_OUTPATIENT_CLINIC_OR_DEPARTMENT_OTHER): Payer: Self-pay

## 2018-12-26 ENCOUNTER — Other Ambulatory Visit: Payer: Self-pay

## 2018-12-27 ENCOUNTER — Ambulatory Visit (HOSPITAL_BASED_OUTPATIENT_CLINIC_OR_DEPARTMENT_OTHER): Payer: 59 | Admitting: Obstetrics & Gynecology

## 2018-12-27 ENCOUNTER — Ambulatory Visit: Payer: 59 | Attending: Obstetrics & Gynecology | Admitting: Obstetrics & Gynecology

## 2018-12-27 ENCOUNTER — Other Ambulatory Visit: Payer: Self-pay

## 2018-12-27 VITALS — BP 92/71 | HR 83 | Wt 141.0 lb

## 2018-12-27 DIAGNOSIS — T192XXA Foreign body in vulva and vagina, initial encounter: Secondary | ICD-10-CM

## 2018-12-27 NOTE — Progress Notes (Signed)
S: 58 YO woman here for estring removal. Due to anatomy she is unable to remove her ring.    She is benefiting from the vag estrogen replacement    Doing OK, safe at home, less housecleaning work but [employer]    O: appears well  Ext vulva WNL  Vag pink moist    A: Estring in place--pt unable to remove  P: Estring removed  Pt will pick up new ring today and place it.  F/U visit 3 months. Reita Chard, APRN

## 2018-12-29 ENCOUNTER — Encounter (HOSPITAL_BASED_OUTPATIENT_CLINIC_OR_DEPARTMENT_OTHER): Payer: Self-pay | Admitting: Obstetrics & Gynecology

## 2019-02-05 ENCOUNTER — Telehealth (HOSPITAL_BASED_OUTPATIENT_CLINIC_OR_DEPARTMENT_OTHER): Payer: Self-pay | Admitting: Family Medicine

## 2019-02-05 DIAGNOSIS — Z20822 Contact with and (suspected) exposure to covid-19: Secondary | ICD-10-CM

## 2019-02-05 DIAGNOSIS — Z20828 Contact with and (suspected) exposure to other viral communicable diseases: Secondary | ICD-10-CM

## 2019-02-05 NOTE — Progress Notes (Signed)
COVID19 TEST

## 2019-02-18 ENCOUNTER — Other Ambulatory Visit (HOSPITAL_BASED_OUTPATIENT_CLINIC_OR_DEPARTMENT_OTHER): Payer: Self-pay | Admitting: Internal Medicine

## 2019-02-19 ENCOUNTER — Ambulatory Visit
Admission: RE | Admit: 2019-02-19 | Discharge: 2019-02-19 | Disposition: A | Discharge status: Home / Self Care | Payer: 59 | Attending: Family Medicine | Admitting: Family Medicine

## 2019-02-19 NOTE — Progress Notes (Signed)
PER Pharmacy, Loretta Martin is a 58 year old female has requested a refill of Fluoxetine 20 mg.      Last Office Visit: 12/11/18 with Blau  Last Physical Exam: 12/11/18    There are no preventive care reminders to display for this patient.    Other Med Adult:  Most Recent BP Reading(s)  12/27/18 : 92/71        Cholesterol (mg/dL)   Date Value   06/26/2018 184     LOW DENSITY LIPOPROTEIN DIRECT (mg/dL)   Date Value   06/26/2018 114     HIGH DENSITY LIPOPROTEIN (mg/dL)   Date Value   06/26/2018 46     TRIGLYCERIDES (mg/dL)   Date Value   06/26/2018 207 (H)         THYROID SCREEN TSH REFLEX FT4 (uIU/mL)   Date Value   12/11/2018 5.680 (H)         TSH (THYROID STIM HORMONE) (uIU/mL)   Date Value   05/26/2016 8.300 (H)       HEMOGLOBIN A1C (%)   Date Value   05/26/2016 5.5       No results found for: POCA1C      INR (no units)   Date Value   05/12/2009 1.0 (L)       SODIUM (mmol/L)   Date Value   12/11/2018 140       POTASSIUM (mmol/L)   Date Value   12/11/2018 4.0           CREATININE (mg/dL)   Date Value   12/11/2018 0.7       Documented patient preferred pharmacies:    [facility] [place], [place] - [place]  Phone: [phone] Fax: [phone]

## 2019-03-25 ENCOUNTER — Other Ambulatory Visit (HOSPITAL_BASED_OUTPATIENT_CLINIC_OR_DEPARTMENT_OTHER): Payer: Self-pay | Admitting: Internal Medicine

## 2019-03-26 NOTE — Progress Notes (Signed)
PER Pharmacy, Loretta Martin is a 58 year old female has requested a refill of vitamin d .      Last Office Visit: 12/27/18 with Reita Chard  Last Physical Exam: 12/11/18      Other Med Adult:  Most Recent BP Reading(s)  12/27/18 : 92/71        Cholesterol (mg/dL)   Date Value   06/26/2018 184     LOW DENSITY LIPOPROTEIN DIRECT (mg/dL)   Date Value   06/26/2018 114     HIGH DENSITY LIPOPROTEIN (mg/dL)   Date Value   06/26/2018 46     TRIGLYCERIDES (mg/dL)   Date Value   06/26/2018 207 (H)         THYROID SCREEN TSH REFLEX FT4 (uIU/mL)   Date Value   12/11/2018 5.680 (H)         TSH (THYROID STIM HORMONE) (uIU/mL)   Date Value   05/26/2016 8.300 (H)       HEMOGLOBIN A1C (%)   Date Value   05/26/2016 5.5       No results found for: POCA1C      INR (no units)   Date Value   05/12/2009 1.0 (L)       SODIUM (mmol/L)   Date Value   12/11/2018 140       POTASSIUM (mmol/L)   Date Value   12/11/2018 4.0           CREATININE (mg/dL)   Date Value   12/11/2018 0.7       Documented patient preferred pharmacies:    [facility] [phone] - [place], [place] - [place]  Phone: [phone] Fax: [phone]

## 2019-03-29 ENCOUNTER — Encounter (HOSPITAL_BASED_OUTPATIENT_CLINIC_OR_DEPARTMENT_OTHER): Payer: Self-pay | Admitting: Obstetrics & Gynecology

## 2019-05-01 ENCOUNTER — Other Ambulatory Visit: Payer: Self-pay

## 2019-05-02 ENCOUNTER — Ambulatory Visit (HOSPITAL_BASED_OUTPATIENT_CLINIC_OR_DEPARTMENT_OTHER): Payer: 59 | Admitting: Family Medicine

## 2019-05-02 ENCOUNTER — Ambulatory Visit: Payer: 59 | Attending: Internal Medicine | Admitting: Internal Medicine

## 2019-05-02 VITALS — HR 116 | Temp 96.7°F

## 2019-05-02 DIAGNOSIS — E039 Hypothyroidism, unspecified: Secondary | ICD-10-CM | POA: Diagnosis present

## 2019-05-02 DIAGNOSIS — M25473 Effusion, unspecified ankle: Secondary | ICD-10-CM | POA: Diagnosis present

## 2019-05-02 DIAGNOSIS — R42 Dizziness and giddiness: Secondary | ICD-10-CM

## 2019-05-02 DIAGNOSIS — I8390 Asymptomatic varicose veins of unspecified lower extremity: Secondary | ICD-10-CM

## 2019-05-02 DIAGNOSIS — R110 Nausea: Secondary | ICD-10-CM | POA: Diagnosis not present

## 2019-05-02 DIAGNOSIS — M79604 Pain in right leg: Secondary | ICD-10-CM | POA: Diagnosis not present

## 2019-05-02 DIAGNOSIS — E038 Other specified hypothyroidism: Secondary | ICD-10-CM

## 2019-05-02 LAB — CBC, PLATELET & DIFFERENTIAL
ABSOLUTE BASO COUNT: 0 10*3/uL (ref 0.0–0.1)
ABSOLUTE EOSINOPHIL COUNT: 0.1 10*3/uL (ref 0.0–0.8)
ABSOLUTE IMM GRAN COUNT: 0.02 10*3/uL (ref 0.00–0.03)
ABSOLUTE LYMPH COUNT: 2.5 10*3/uL (ref 0.6–5.9)
ABSOLUTE MONO COUNT: 0.6 10*3/uL (ref 0.2–1.4)
ABSOLUTE NEUTROPHIL COUNT: 3 10*3/uL (ref 1.6–8.3)
ABSOLUTE NRBC COUNT: 0 10*3/uL (ref 0.0–0.0)
BASOPHIL %: 0.6 % (ref 0.0–1.2)
EOSINOPHIL %: 1.4 % (ref 0.0–7.0)
HEMATOCRIT: 40.2 % (ref 34.1–44.9)
HEMOGLOBIN: 13.5 g/dL (ref 11.2–15.7)
IMMATURE GRANULOCYTE %: 0.3 % (ref 0.0–0.4)
LYMPHOCYTE %: 39.4 % (ref 15.0–54.0)
MEAN CORP HGB CONC: 33.6 g/dL (ref 31.0–37.0)
MEAN CORPUSCULAR HGB: 31.5 pg (ref 26.0–34.0)
MEAN CORPUSCULAR VOL: 93.9 fl (ref 80.0–100.0)
MEAN PLATELET VOLUME: 10.5 fL (ref 8.7–12.5)
MONOCYTE %: 9.9 % (ref 4.0–13.0)
NEUTROPHIL %: 48.4 % (ref 40.0–75.0)
NRBC %: 0 % (ref 0.0–0.0)
PLATELET COUNT: 236 10*3/uL (ref 150–400)
RBC DISTRIBUTION WIDTH STD DEV: 41.6 fL (ref 35.1–46.3)
RED BLOOD CELL COUNT: 4.28 M/uL (ref 3.90–5.20)
WHITE BLOOD CELL COUNT: 6.2 10*3/uL (ref 4.0–11.0)

## 2019-05-02 LAB — COMPREHENSIVE METABOLIC PANEL
ALANINE AMINOTRANSFERASE: 27 U/L (ref 12–45)
ALBUMIN: 4 g/dL (ref 3.4–5.0)
ALKALINE PHOSPHATASE: 83 U/L (ref 45–117)
ANION GAP: 8 mmol/L (ref 5–15)
ASPARTATE AMINOTRANSFERASE: 25 U/L (ref 8–34)
BILIRUBIN TOTAL: 0.5 mg/dL (ref 0.2–1.0)
BUN (UREA NITROGEN): 15 mg/dL (ref 7–18)
CALCIUM: 9.7 mg/dL (ref 8.5–10.1)
CARBON DIOXIDE: 26 mmol/L (ref 21–32)
CHLORIDE: 103 mmol/L (ref 98–107)
CREATININE: 0.9 mg/dL (ref 0.4–1.2)
ESTIMATED GLOMERULAR FILT RATE: >60 mL/min (ref >60)
Glucose Random: 93 mg/dL (ref 74–160)
POTASSIUM: 4.9 mmol/L (ref 3.5–5.1)
SODIUM: 137 mmol/L (ref 136–145)
TOTAL PROTEIN: 8.3 g/dL — ABNORMAL HIGH (ref 6.4–8.2)

## 2019-05-02 LAB — FREE THYROXINE: FREE THYROXINE: 0.94 ng/dL (ref 0.76–1.46)

## 2019-05-02 LAB — THYROID SCREEN TSH REFLEX FT4: THYROID SCREEN TSH REFLEX FT4: 4.66 u[IU]/mL — ABNORMAL HIGH (ref 0.358–3.740)

## 2019-05-02 MED ORDER — RANITIDINE HCL 150 MG PO CAPS
150.0000 mg | ORAL_CAPSULE | Freq: Two times a day (BID) | ORAL | 11 refills | Status: DC
Start: 2019-05-02 — End: 2020-01-16

## 2019-05-02 NOTE — Progress Notes (Signed)
58 yo female seen today for strong nausea  2 weeks ago having strong nausea and wanting to throw up   Felt weak  Denies headache  Nausea has been going on a long time (1 mo) but now getting stronger     Denies hx of fever, cough, recent travel   Denies stomach pain  Denies reflux    Has some dizziness when nausea is strong  Denies numbness or weakness on one side  Occasionally some numbness in fingers / hangs, but not consistent   Denies new medications    Still taking SSRI    Leg on one side has been having pain   A little swelling in front of shin  Can be uncomfortable  Hurts with walking    Review of notes of pcp visit last year reveal similar sx limited in time duration     Pulse 116    Temp 96.7 F (35.9 C)    LMP 11/02/2009    SpO2 96%   Alert and pleasant nad  Chest is clear, no wheezing or rales. Normal symmetric air entry throughout both lung fields. No chest wall deformities or tenderness.  S1 and S2 normal, no murmurs, clicks, gallops or rubs. Regular rate and rhythm. Chest is clear; no wheezes or rales. No edema or JVD.  Abdomen with mild epigastric tenderness to palpation, ow normal  R leg with no edema, but mild tenderness behind calf; increase discomfort with extension of leg and calf raises    A/P:  R LE discomfort  Suspect musculoskeletal vs bakers cyst  Stretch, tylenol prn  F/u for PT if worsening     Nausea  About 1 mo  Will trial zantac and do standing orders  Consider u/s if sx persist or worsening    Will have pt connect with Dr Staci Righter for televisit in 1 week

## 2019-05-02 NOTE — Progress Notes (Signed)
[facility] CARE CENTER TELEVISIT NOTE:    CC: Patient presents with:  Nausea      HISTORY OF PRESENT ILLNESS:     Loretta Martin is a 58 year old female who presents for:    Sick to her stomach and her legs are sore from her knees down. It's been a month. It's all the time. She even thought about going to the ED. Then it kind of went away. She feels like she's going to throw up but hasn't. No weight loss. When it's strong, she gets dizzy. No fevers or chills. She has lower extremity swelling. Her ankle was swollen (right side) and it hurts. She doesn't remember an injury.     Her primary care provider is Berdine Addison, MD.    History:    Past medical history, social history, and family history reviewed and updated in EMR. Notable for    Patient Active Problem List:     Nonspecific reaction to tuberculin skin test without active tuberculosis     Major depressive disorder, recurrent, in remission ([place])     Mammographic microcalcification, normal BX     PVD (posterior vitreous detachment)     Asymptomatic neurocysticercosis     Female stress incontinence     Family history of diabetes mellitus type II     Seasonal allergic rhinitis     BMI 26.0-26.9,adult     Varicose veins of lower extremity     Subclinical hypothyroidism      Social History    Tobacco Use      Smoking status: Former Smoker        Start date: 10/05/2003        Quit date: 08/10/2004        Years since quitting: 14.7      Smokeless tobacco: Never Used      Tobacco comment: smoked only for 6 months.    Alcohol use: Yes      Comment: minimal    Drug use: No      Review of patient's family history indicates:  Problem: Cancer - Other      Relation: Father          Age of Onset: (Not Specified)          Comment: lung & skin cancer  Problem: Diabetes      Relation: Mother          Age of Onset: (Not Specified)  Problem: Hypertension      Relation: Mother          Age of Onset: (Not Specified)      Social History    Social History Narrative       To [place] from [place] in [place]. 3 children, all adults. Husband Jose. Housekeeper. Prior [facility] patient.      Medications: See below.    Allergies: Reviewed and updated in EMR.    PHYSICAL EXAM:    Deferred in the setting of COVID.    Speaking in full sentences    ASSESSMENT AND PLAN:    Loretta Martin is a 58 year old female who presents with the following:    Varicose veins of lower extremity, unspecified laterality, unspecified whether complicated  Nausea  Lightheadedness  Ankle swelling, unspecified laterality  Unclear constellation of symptoms. Initially described unilateral LE swelling but then on further questioning seemed to be more of the "ankle," though she couldn't clarify exactly where -- so need an exam. Second, needs  evaluation for her nausea and persistent lightheadedness; I have placed basic future lab orders but defer to on-site provider for any additional studies pending physical exam. Does have history of subclinical hypothyroidism, but nausea is of course non-specific here.        Dispo: In-person visit required as detailed above    Medications at the end of encounter:      Current Outpatient Medications:     VITAMIN D-400 10 MCG (400 UNIT) TABS Tablet, TAKE 1 TABLET BY MOUTH DAILY, Disp: 30 tablet, Rfl: 11    FLUoxetine (PROZAC) 20 MG capsule, TAKE 2 CAPSULES BY MOUTH EVERY DAY, Disp: 180 capsule, Rfl: 3    naproxen (NAPROSYN) 500 MG tablet, Take 1 tablet by mouth 2 (two) times daily with meals  for 7 days, Disp: 14 tablet, Rfl: 0    fluticasone (FLONASE) 50 MCG/ACT nasal spray, 2 sprays by Each Nostril route daily, Disp: 1 Bottle, Rfl: 11    fluticasone (FLONASE) 50 MCG/ACT nasal spray, 2 sprays by Each Nostril route daily, Disp: 1 Bottle, Rfl: 11    PROAIR HFA 108 (90 Base) MCG/ACT inhaler, Inhale 1 puff into the lungs every 4 (four) hours as needed for Wheezing, Disp: 3 Inhaler, Rfl: 0    Multiple Vitamin (MULTIVITAMIN) TABS, Take 1 tablet by mouth daily., Disp: ,  Rfl:     omega-3 acid ethyl esters (LOVAZA) 1 GM capsule, Take 2 g by mouth 2 (two) times daily., Disp: , Rfl:

## 2019-05-02 NOTE — Addendum Note (Signed)
Addended by: Alvia Grove on: 05/02/2019 03:38 PM     Modules accepted: Orders

## 2019-05-03 ENCOUNTER — Other Ambulatory Visit: Payer: Self-pay

## 2019-05-03 ENCOUNTER — Encounter (HOSPITAL_BASED_OUTPATIENT_CLINIC_OR_DEPARTMENT_OTHER): Payer: Self-pay | Admitting: Internal Medicine

## 2019-05-03 NOTE — Progress Notes (Signed)
LVM to call [facility] to schedule with Dr Staci Righter for televisit in 1 week at [facility]

## 2019-05-10 ENCOUNTER — Other Ambulatory Visit: Payer: Self-pay

## 2019-05-10 ENCOUNTER — Ambulatory Visit: Payer: 59 | Attending: Obstetrics & Gynecology | Admitting: Obstetrics & Gynecology

## 2019-05-10 VITALS — BP 122/77 | Wt 144.0 lb

## 2019-05-10 DIAGNOSIS — N952 Postmenopausal atrophic vaginitis: Secondary | ICD-10-CM | POA: Diagnosis present

## 2019-05-10 MED ORDER — ESTRADIOL 2 MG VA RING
VAGINAL_RING | VAGINAL | 3 refills | Status: AC
Start: 2019-05-10 — End: 2020-05-10

## 2019-05-10 NOTE — Patient Instructions (Signed)
Sb 3 months Ring removal

## 2019-05-10 NOTE — Progress Notes (Signed)
S: 58 YO woman here for removal of her Estring which is very difficult for her to do    Needs new script for estring  This is working well for her atrophic vaginitis  Due for pap in 9 months      Reports nausea but this is longstanding issue for her    O: appears well  Vulva WNL  Vagina pink moist     A: Estring in vagina and pt unable to remove  P: Removed estring without difficulty  3 months follow up  Pap in 4/21.

## 2019-05-11 ENCOUNTER — Ambulatory Visit (HOSPITAL_BASED_OUTPATIENT_CLINIC_OR_DEPARTMENT_OTHER): Payer: 59 | Admitting: Internal Medicine

## 2019-05-11 NOTE — Progress Notes (Signed)
Patient left without being seen.

## 2019-05-17 ENCOUNTER — Ambulatory Visit: Payer: 59 | Attending: Internal Medicine | Admitting: Internal Medicine

## 2019-05-17 DIAGNOSIS — E039 Hypothyroidism, unspecified: Secondary | ICD-10-CM | POA: Diagnosis present

## 2019-05-17 DIAGNOSIS — R110 Nausea: Secondary | ICD-10-CM | POA: Diagnosis present

## 2019-05-17 DIAGNOSIS — E038 Other specified hypothyroidism: Secondary | ICD-10-CM

## 2019-05-17 NOTE — Progress Notes (Addendum)
[facility] CARE CENTER TELEVISIT NOTE:    CC: Patient presents with:  Nausea      HISTORY OF PRESENT ILLNESS:     Loretta Martin is a 59 year old female who presents for:    Still has nausea -- more nausea. She was so sick. She is also having lower abdominal pain; no stomach pain and no reflux. It's swelling. Not constipated. Sometimes she gets dizzy and weak because of the nausea.    No fatigue, dry skin, constipation.    Her primary care provider is Berdine Addison, MD.    History:    Past medical history, social history, and family history reviewed and updated in EMR. Notable for    Patient Active Problem List:     Nonspecific reaction to tuberculin skin test without active tuberculosis     Major depressive disorder, recurrent, in remission ([place])     Mammographic microcalcification, normal BX     PVD (posterior vitreous detachment)     Asymptomatic neurocysticercosis     Female stress incontinence     Family history of diabetes mellitus type II     Seasonal allergic rhinitis     BMI 26.0-26.9,adult     Varicose veins of lower extremity     Subclinical hypothyroidism      Social History    Tobacco Use      Smoking status: Former Smoker        Start date: 10/05/2003        Quit date: 08/10/2004        Years since quitting: 14.7      Smokeless tobacco: Never Used      Tobacco comment: smoked only for 6 months.    Alcohol use: Yes      Comment: minimal    Drug use: No      Review of patient's family history indicates:  Problem: Cancer - Other      Relation: Father          Age of Onset: (Not Specified)          Comment: lung & skin cancer  Problem: Diabetes      Relation: Mother          Age of Onset: (Not Specified)  Problem: Hypertension      Relation: Mother          Age of Onset: (Not Specified)      Social History    Social History Narrative      To [place] from [place] in [place]. 3 children, all adults. Husband Jose. Housekeeper. Prior [facility] patient.      Medications: See  below.    Allergies: Reviewed and updated in EMR.    PHYSICAL EXAM:    Deferred in the setting of COVID.    Speaking in full sentences    ASSESSMENT AND PLAN:    Loretta Martin is a 58 year old female who presents with the following:    Nausea  Will get H pylori, but if negative, will refer for endoscopy as nausea otherwise remains unexplained.  -     HEL[employer] PYL[employer]I STOOL [employer]; Future    Subclinical hypothyroidism  Mild, asymptomatic. Discussed, she agrees no Tx given asymptomatic and mild.    Dispo: Labs ordered, referred for lab draw, follow up after labs    Medications at the end of encounter:      Current Outpatient Medications:     estradiol (ESTRING) 2 MG vaginal ring,  PLACE 2 MILLIGRAM VAGINALLY EVERY 3 MONTHS, Disp: 1 each, Rfl: 3    ranitidine (ZANTAC) 150 MG capsule, Take 1 capsule by mouth 2 (two) times daily, Disp: 60 capsule, Rfl: 11    VITAMIN D-400 10 MCG (400 UNIT) TABS Tablet, TAKE 1 TABLET BY MOUTH DAILY, Disp: 30 tablet, Rfl: 11    FLUoxetine (PROZAC) 20 MG capsule, TAKE 2 CAPSULES BY MOUTH EVERY DAY, Disp: 180 capsule, Rfl: 3    naproxen (NAPROSYN) 500 MG tablet, Take 1 tablet by mouth 2 (two) times daily with meals  for 7 days, Disp: 14 tablet, Rfl: 0    fluticasone (FLONASE) 50 MCG/ACT nasal spray, 2 sprays by Each Nostril route daily, Disp: 1 Bottle, Rfl: 11    fluticasone (FLONASE) 50 MCG/ACT nasal spray, 2 sprays by Each Nostril route daily, Disp: 1 Bottle, Rfl: 11    PROAIR HFA 108 (90 Base) MCG/ACT inhaler, Inhale 1 puff into the lungs every 4 (four) hours as needed for Wheezing, Disp: 3 Inhaler, Rfl: 0    Multiple Vitamin (MULTIVITAMIN) TABS, Take 1 tablet by mouth daily., Disp: , Rfl:     omega-3 acid ethyl esters (LOVAZA) 1 GM capsule, Take 2 g by mouth 2 (two) times daily., Disp: , Rfl:

## 2019-05-24 ENCOUNTER — Ambulatory Visit: Payer: 59 | Attending: Internal Medicine

## 2019-05-24 DIAGNOSIS — R110 Nausea: Secondary | ICD-10-CM | POA: Insufficient documentation

## 2019-05-24 NOTE — Progress Notes (Signed)
Patient came in for future lab, the specimen was collected with no issues.

## 2019-05-25 LAB — HELICOBACTER PYLORI STOOL AG

## 2019-05-30 ENCOUNTER — Telehealth (HOSPITAL_BASED_OUTPATIENT_CLINIC_OR_DEPARTMENT_OTHER): Payer: Self-pay | Admitting: Registered Nurse

## 2019-05-30 NOTE — Progress Notes (Signed)
Hello,    Please:    1. Create a Telephone Encounter for this patient.    2. Share with the patient the attached results: H pylori negative .I would like to refer for an endoscopy if she is still having nausea. Is she still having nausea?    Plan:    1. Type of outreach: 3 phone calls and if unable to reach send letter    2. Document the conversation in the Telephone Encounter.    3. If the patient has any further questions, please route the encounter back to me.    4. If any prescriptions are required, please route back to me with preferred pharmacy.    5. If any studies are required, please route back to me with confirmation that the patient has agreed to the studies.    Thank you,  Berdine Addison, MD

## 2019-05-30 NOTE — Progress Notes (Signed)
Called pt per PCP request  No answer  Left message to call back the clinic  No PHI disclosed  Harrison Mons, RN 05/30/19 3:25 PM               Message   Received: Today   Message Contents   Berdine Addison, MD  [address] Adult Nurse Pool            Hello,     Please:     1. Create a Telephone Encounter for this patient.     2. Share with the patient the attached results: H pylori negative .I would like to refer for an endoscopy if she is still having nausea. Is she still having nausea?     Plan:     1. Type of outreach: 3 phone calls and if unable to reach send letter     2. Document the conversation in the Telephone Encounter.     3. If the patient has any further questions, please route the encounter back to me.     4. If any prescriptions are required, please route back to me with preferred pharmacy.     5. If any studies are required, please route back to me with confirmation that the patient has agreed to the studies.     Thank you,   Berdine Addison, MD

## 2019-05-31 NOTE — Progress Notes (Signed)
Second attempt to reach pt  No answer  Left message to call back the clinic  No phi disclosed  Harrison Mons, RN 05/31/19 8:46 AM

## 2019-06-01 NOTE — Progress Notes (Signed)
If her nausea is resolved she does not require endoscopy. Could you let her know?

## 2019-06-01 NOTE — Progress Notes (Signed)
third attempt to reach pt  Confirmed name and dob  Reiterated results per Dr. Philippa Sicks message  PT is in agreement for the Endoscopy if possible or needed  Pt has nausea only when the weather is hot outside  Now that it has been cooler she has no nausea    Will route to PCP    Harrison Mons, RN 06/01/19 8:25 AM

## 2019-06-01 NOTE — Progress Notes (Signed)
Called pt per request of PCP  Confirmed name and dob  Reiterated Dr Staci Righter message  Advised pt to call back if her nausea returns    Will route to PCP             Berdine Addison, MD 43 minutes ago (12:36 PM)                        If her nausea is resolved she does not require endoscopy. Could you let her know?        Harrison Mons, RN 06/01/19 1:22 PM

## 2019-06-05 ENCOUNTER — Encounter (HOSPITAL_BASED_OUTPATIENT_CLINIC_OR_DEPARTMENT_OTHER): Payer: Self-pay

## 2019-06-05 NOTE — Progress Notes (Signed)
[facility]. Care Coordination Program. Pt outreached to inform about the program and to provide education about prevention and early detection of breast and cervical cancer. Left vm.

## 2019-06-21 ENCOUNTER — Encounter (HOSPITAL_BASED_OUTPATIENT_CLINIC_OR_DEPARTMENT_OTHER): Payer: Self-pay | Admitting: Family Medicine

## 2019-06-21 ENCOUNTER — Ambulatory Visit: Payer: 59 | Attending: Family Medicine | Admitting: Family Medicine

## 2019-06-21 DIAGNOSIS — M7989 Other specified soft tissue disorders: Secondary | ICD-10-CM | POA: Diagnosis not present

## 2019-06-21 DIAGNOSIS — M79604 Pain in right leg: Secondary | ICD-10-CM | POA: Diagnosis present

## 2019-06-21 NOTE — Progress Notes (Signed)
Called patient and she is driving.    She says that she has had pain in her leg - her right leg. This is the same pain that she has had for some time.  She says that this pain is in her right leg - she does have varicose veins and she says that she feels her leg sometimes gets numb. She says that it goes down her leg and her whole leg is numb. She says that she has no pain in her back.  The numbness lasts for 10 minutes. And this happens daily.   She was going to go to the ER for this yesterday.  She has swelling in the top of her foot. Not all the time but sometimes.  She has pain in the back of her calf.    She says that tylenol does not help.  She says that she can see the veins in her legs.  No pain in her chest or trouble breathing.  She says that this has been ongoing for 2 months and she is concerned about her veins.           PE: deferred due to telephone visit    A/P: 58 year old woman  1. Right leg pain - ongoing issue with some swelling  Will order ultrasound to rule out dvt as part of this  If negative could be the result of varicose veins and she may benefit from stockings and consultation  She describes a stocking like numbness which would not be in any neurological distribution but might need face to face exam to see what the exam looks like

## 2019-06-29 ENCOUNTER — Other Ambulatory Visit: Payer: Self-pay

## 2019-06-29 ENCOUNTER — Ambulatory Visit
Admission: RE | Admit: 2019-06-29 | Discharge: 2019-06-29 | Disposition: A | Discharge status: Home / Self Care | Payer: 59 | Attending: Family Medicine | Admitting: Family Medicine

## 2019-06-29 DIAGNOSIS — M7989 Other specified soft tissue disorders: Secondary | ICD-10-CM | POA: Insufficient documentation

## 2019-07-02 ENCOUNTER — Encounter (HOSPITAL_BASED_OUTPATIENT_CLINIC_OR_DEPARTMENT_OTHER): Payer: Self-pay | Admitting: Family Medicine

## 2019-08-08 ENCOUNTER — Ambulatory Visit (HOSPITAL_BASED_OUTPATIENT_CLINIC_OR_DEPARTMENT_OTHER): Payer: Self-pay | Admitting: Obstetrics & Gynecology

## 2019-08-08 ENCOUNTER — Encounter (HOSPITAL_BASED_OUTPATIENT_CLINIC_OR_DEPARTMENT_OTHER): Payer: Self-pay | Admitting: Obstetrics & Gynecology

## 2019-11-28 ENCOUNTER — Ambulatory Visit: Payer: 59 | Attending: Internal Medicine | Admitting: Internal Medicine

## 2019-11-28 DIAGNOSIS — Z1211 Encounter for screening for malignant neoplasm of colon: Secondary | ICD-10-CM | POA: Diagnosis not present

## 2019-11-28 DIAGNOSIS — Z1231 Encounter for screening mammogram for malignant neoplasm of breast: Secondary | ICD-10-CM | POA: Diagnosis not present

## 2019-11-28 DIAGNOSIS — M19041 Primary osteoarthritis, right hand: Secondary | ICD-10-CM | POA: Diagnosis not present

## 2019-11-28 MED ORDER — NAPROXEN 375 MG PO TABS
375.0000 mg | ORAL_TABLET | Freq: Two times a day (BID) | ORAL | 0 refills | Status: DC
Start: 2019-11-28 — End: 2019-12-10

## 2019-11-28 NOTE — Progress Notes (Signed)
[place]    This Televisit  is conducted during COVID 19 Pandemic Emergency    Cc: joint pain    Seen with family/friend/trainee/chaperone N/A    SUBJECTIVE   1) having pain of joints of R hand fingers.   Started with thumb, and is now also 2nd finger  Recalls no injury.  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   Not worse at night.   Sometimes gets numbness of R fingers, during the day.      She is R hand dominant.      Tylenol (yesterday) was helpful.      No other new or unusual joint pain    Relevant PMHx, PShx, SocHx, Fam Hx:  Patient Active Problem List:     Nonspecific reaction to tuberculin skin test without active tuberculosis     Major depressive disorder, recurrent, in remission ([place])     Mammographic microcalcification, normal BX     PVD (posterior vitreous detachment)     Asymptomatic neurocysticercosis     Female stress incontinence     Family history of diabetes mellitus type II     Seasonal allergic rhinitis     BMI 26.0-26.9,adult     Varicose veins of lower extremity     Subclinical hypothyroidism    -----------------------    Objective  Vitals  TELEVISIT      A/p    1. Arthritis of right hand  Could be arthritis (OA) - likely  RA could present this way   Doubt carpal tunnel, although numbness sometimes  Plan  Try naproxen  Call in 1 wk if not signif better.   At that point wuold need in person    2. Screening mammogram, encounter for  Discussed/ordered  - [place] SCREENING MAMMO BILATERAL DIGITAL WITH DBT & CAD; Future    3. Colon cancer screening  Discussed/ordered  - COLONOSCOPY; Future    Pt is reminded of social distancing, hygiene and other safety measures pursuant to the COVID 19 Pandemic

## 2019-12-09 ENCOUNTER — Other Ambulatory Visit (HOSPITAL_BASED_OUTPATIENT_CLINIC_OR_DEPARTMENT_OTHER): Payer: Self-pay | Admitting: Internal Medicine

## 2019-12-09 NOTE — Telephone Encounter (Signed)
PER Pharmacy, Loretta Martin is a 59 year old female has requested a refill of      -  Naproxen  This medication has been reordered with the same quantity and end date as the last time it was prescribed.        Last Office Visit: 11/28/2019 with Tamera Stands  Last Physical Exam: 12/11/2018     COLONOSCOPY due on 07/31/2019  PAP SMEAR due on 01/27/2020  HPV SCREENING due on 01/27/2020     Other Med Adult:  Most Recent BP Reading(s)  05/10/19 : 122/77        Cholesterol (mg/dL)   Date Value   06/26/2018 184     LOW DENSITY LIPOPROTEIN DIRECT (mg/dL)   Date Value   06/26/2018 114     HIGH DENSITY LIPOPROTEIN (mg/dL)   Date Value   06/26/2018 46     TRIGLYCERIDES (mg/dL)   Date Value   06/26/2018 207 (H)         THYROID SCREEN TSH REFLEX FT4 (uIU/mL)   Date Value   05/02/2019 4.660 (H)         TSH (THYROID STIM HORMONE) (uIU/mL)   Date Value   05/26/2016 8.300 (H)       HEMOGLOBIN A1C (%)   Date Value   05/26/2016 5.5       No results found for: POCA1C      INR (no units)   Date Value   05/12/2009 1.0 (L)       SODIUM (mmol/L)   Date Value   05/02/2019 137       POTASSIUM (mmol/L)   Date Value   05/02/2019 4.9           CREATININE (mg/dL)   Date Value   05/02/2019 0.9        Documented patient preferred pharmacies:    [facility] [phone] - [place], [place] - [place]  Phone: [phone] Fax: [phone]

## 2019-12-22 ENCOUNTER — Other Ambulatory Visit (HOSPITAL_BASED_OUTPATIENT_CLINIC_OR_DEPARTMENT_OTHER): Payer: Self-pay | Admitting: Internal Medicine

## 2019-12-22 NOTE — Telephone Encounter (Signed)
PER Pharmacy, Loretta Martin is a 59 year old female has requested a refill of naproxen 375.      Last Office Visit: 11-28-19 with Kendal Hymen  Last Physical Exam: 12-11-18    COLONOSCOPY due on 07/31/2019  PAP SMEAR due on 01/27/2020  HPV SCREENING due on 01/27/2020    Other Med Adult:  Most Recent BP Reading(s)  05/10/19 : 122/77        Cholesterol (mg/dL)   Date Value   06/26/2018 184     LOW DENSITY LIPOPROTEIN DIRECT (mg/dL)   Date Value   06/26/2018 114     HIGH DENSITY LIPOPROTEIN (mg/dL)   Date Value   06/26/2018 46     TRIGLYCERIDES (mg/dL)   Date Value   06/26/2018 207 (H)         THYROID SCREEN TSH REFLEX FT4 (uIU/mL)   Date Value   05/02/2019 4.660 (H)         TSH (THYROID STIM HORMONE) (uIU/mL)   Date Value   05/26/2016 8.300 (H)       HEMOGLOBIN A1C (%)   Date Value   05/26/2016 5.5       No results found for: POCA1C      INR (no units)   Date Value   05/12/2009 1.0 (L)       SODIUM (mmol/L)   Date Value   05/02/2019 137       POTASSIUM (mmol/L)   Date Value   05/02/2019 4.9           CREATININE (mg/dL)   Date Value   05/02/2019 0.9       Documented patient preferred pharmacies:    [facility] [phone] - [place], [place] - [place]  Phone: [phone] Fax: [phone]

## 2020-01-04 ENCOUNTER — Other Ambulatory Visit (HOSPITAL_BASED_OUTPATIENT_CLINIC_OR_DEPARTMENT_OTHER): Payer: Self-pay | Admitting: Internal Medicine

## 2020-01-04 ENCOUNTER — Encounter (HOSPITAL_BASED_OUTPATIENT_CLINIC_OR_DEPARTMENT_OTHER): Payer: Self-pay | Admitting: Obstetrics & Gynecology

## 2020-01-04 DIAGNOSIS — J301 Allergic rhinitis due to pollen: Secondary | ICD-10-CM

## 2020-01-04 NOTE — Telephone Encounter (Signed)
PER Daughter, Loretta Martin is a 59 year old female has requested a refill of proair.      Last Office Visit: 11/28/2019 with Tamera Stands  Last Physical Exam: 12/11/2018      Other Med Adult:  Most Recent BP Reading(s)  05/10/19 : 122/77        Cholesterol (mg/dL)   Date Value   06/26/2018 184     LOW DENSITY LIPOPROTEIN DIRECT (mg/dL)   Date Value   06/26/2018 114     HIGH DENSITY LIPOPROTEIN (mg/dL)   Date Value   06/26/2018 46     TRIGLYCERIDES (mg/dL)   Date Value   06/26/2018 207 (H)         THYROID SCREEN TSH REFLEX FT4 (uIU/mL)   Date Value   05/02/2019 4.660 (H)         TSH (THYROID STIM HORMONE) (uIU/mL)   Date Value   05/26/2016 8.300 (H)       HEMOGLOBIN A1C (%)   Date Value   05/26/2016 5.5       No results found for: POCA1C      INR (no units)   Date Value   05/12/2009 1.0 (L)       SODIUM (mmol/L)   Date Value   05/02/2019 137       POTASSIUM (mmol/L)   Date Value   05/02/2019 4.9           CREATININE (mg/dL)   Date Value   05/02/2019 0.9       Documented patient preferred pharmacies:    [facility] [phone] - [place], [place] - [place]  Phone: [phone] Fax: [phone]

## 2020-01-05 DIAGNOSIS — Z23 Encounter for immunization: Secondary | ICD-10-CM | POA: Diagnosis not present

## 2020-01-09 MED ORDER — PROAIR HFA 108 (90 BASE) MCG/ACT IN AERS
1.0000 | INHALATION_SPRAY | RESPIRATORY_TRACT | 0 refills | Status: DC | PRN
Start: 2020-01-09 — End: 2021-03-27

## 2020-01-15 ENCOUNTER — Emergency Department
Admission: AD | Admit: 2020-01-15 | Discharge: 2020-01-15 | Disposition: A | Discharge status: Home / Self Care | Payer: 59 | Source: Intra-hospital | Attending: Emergency Medicine | Admitting: Emergency Medicine

## 2020-01-15 ENCOUNTER — Telehealth (HOSPITAL_BASED_OUTPATIENT_CLINIC_OR_DEPARTMENT_OTHER): Payer: Self-pay

## 2020-01-15 ENCOUNTER — Encounter (HOSPITAL_BASED_OUTPATIENT_CLINIC_OR_DEPARTMENT_OTHER): Payer: Self-pay

## 2020-01-15 DIAGNOSIS — R110 Nausea: Secondary | ICD-10-CM | POA: Diagnosis present

## 2020-01-15 DIAGNOSIS — R42 Dizziness and giddiness: Secondary | ICD-10-CM | POA: Diagnosis not present

## 2020-01-15 MED ORDER — ONDANSETRON 4 MG PO TBDP
4.0000 mg | ORAL_TABLET | Freq: Once | ORAL | Status: AC
Start: 2020-01-15 — End: 2020-01-15
  Administered 2020-01-15: 4 mg via SUBLINGUAL
  Filled 2020-01-15: qty 1

## 2020-01-15 NOTE — Narrator Note (Signed)
MD examined patient. Patient was medicated with zofran for nausea per MD order. Patient cleared for discharge. Patient stating that the zofran is helping her nausea. Patient requesting to transport self to [facility] for further evaluation. Patient left to care of self with no other complaints.

## 2020-01-15 NOTE — UC Triage Notes (Signed)
Presents to the UC with nausea and dizziness off and on for the past few days. No headache, no head trauma, no fever or chills.

## 2020-01-15 NOTE — Discharge Instructions (Signed)
You have chosen to go to [facility] emergency department for more evaluation.  They are expecting you.

## 2020-01-15 NOTE — UC Provider Notes (Signed)
I have reviewed the UC nursing notes. I have reviewed the patient's past medical history/problem list, allergies, social history and medication list.  I saw this patient primarily.  Interpreter offered and declined.      HPI:  This is a 59 year old female patient presenting with feeling very nauseated and dizzy since last night, says it is not vertiginous but rather lightheaded.  Says she did have an episode of nonbloody diarrhea last night.  Has not actually thrown up.  Has had a few episodes of similar over the past few months, often does throw up with the episodes.  Is here because she would like to know what is going on.  Has not actually talked her doctor about it.  No change in vision or hearing or speech.  No headache.  No chest pain or trouble breathing.  From records, I do see history of Mnire's disease, however the patient does not recall having this and cannot clarify whether this feels similar.  Has not tried anything for her symptoms today.  Tried to go to work this morning but felt so unwell that she had to leave.  No sweats, has had some chills, no fevers.  No urinary symptoms.    ROS: Pertinent positives were reviewed as per the HPI above. All other systems were reviewed and are negative.    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      Comment:  treated [facility] 11/97 CSF VDRL neg 9/00 RPR 9/00 1:2 repeat                RPR yearly   08/31/2004: Nonspecific abnormal results of thyroid function study      Comment:  increased TSH 4.19 6/00; TSH nl 9/00; 05/04/99 - wnl  04/23/2009: Presbyopia  04/23/2009: PVD (posterior vitreous detachment)  08/31/2004: Trichomoniasis, unspecified      Comment:  10/97  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      Comment:  +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 - had  negative CXR pp at [facility] referred to TB               clinic  08/31/2004: Urinary tract infection, site not specified      Comment:  4/02, 6/02  Patient Active Problem List:     Nonspecific reaction to tuberculin skin test without active tuberculosis     Major depressive disorder, recurrent, in remission ([place])     Mammographic microcalcification, normal BX     PVD (posterior vitreous detachment)     Asymptomatic neurocysticercosis     Female stress incontinence     Family history of diabetes mellitus type II     Seasonal allergic rhinitis     BMI 26.0-26.9,adult     Varicose veins of lower extremity     Subclinical hypothyroidism      Past Surgical History:  Past Surgical History:  No date: BREAST BIOPSY & NEEDLE LOC WIR  No date: EXCISION EXCESSIVE SKIN & SUBQ TISSUE ABDOMEN      Comment:  abdominoplasty in [place]  1998: LIG/TRNSXJ FLP TUBE ABDL/VAG APPR UNI/BI      Comment:  Tubal Ligation  No date: OB ANTEPARTUM CARE CESAREAN DLVR & POSTPARTUM  Comment:  c/section x 2  2004: REDUCTION MAMMAPLASTY  No date: TRURL RF FEMALE BLADDER NECK STRS URIN INCONT    Medications:   No current facility-administered medications for this encounter.     Current Outpatient Medications   Medication Sig    PROAIR HFA 108 (90 Base) MCG/ACT inhaler Inhale 1 puff into the lungs every 4 (four) hours as needed for Wheezing    naproxen (NAPROSYN) 375 MG tablet TAKE 1 TABLET BY MOUTH TWICE DAILY WITH MEALS FOR 14 DAYS    estradiol (ESTRING) 2 MG vaginal ring PLACE 2 MILLIGRAM VAGINALLY EVERY 3 MONTHS    ranitidine (ZANTAC) 150 MG capsule Take 1 capsule by mouth 2 (two) times daily    VITAMIN D-400 10 MCG (400 UNIT) TABS Tablet TAKE 1 TABLET BY MOUTH DAILY    FLUoxetine (PROZAC) 20 MG capsule TAKE 2 CAPSULES BY MOUTH EVERY DAY    fluticasone (FLONASE) 50 MCG/ACT nasal spray 2 sprays by Each Nostril route daily    fluticasone (FLONASE) 50 MCG/ACT nasal spray 2 sprays by Each Nostril route daily    Multiple Vitamin (MULTIVITAMIN)  TABS Take 1 tablet by mouth daily.    omega-3 acid ethyl esters (LOVAZA) 1 GM capsule Take 2 g by mouth 2 (two) times daily.       Social History:  Social History    Tobacco Use      Smoking status: Former Smoker        Start date: 10/05/2003        Quit date: 08/10/2004        Years since quitting: 15.4      Smokeless tobacco: Never Used      Tobacco comment: smoked only for 6 months.    Alcohol use: Yes      Comment: minimal      Family History:   Review of patient's family history indicates:  Problem: Cancer - Other      Relation: Father          Age of Onset: (Not Specified)          Comment: lung & skin cancer  Problem: Diabetes      Relation: Mother          Age of Onset: (Not Specified)  Problem: Hypertension      Relation: Mother          Age of Onset: (Not Specified)       Allergies:  Review of Patient's Allergies indicates:   Hydrocodone-acetami*    Nausea and Vomiting   Pcn [penicillins]           Comment:Got a urine infection after using    Physical Exam:   01/15/20  1341 01/15/20  1346   BP: 133/82    Pulse: 88    Resp: 20    Temp: 97.4 F    TempSrc: Temporal    SpO2: 96%    Weight:  65.8 kg (145 lb)     GEN: NAD, appears comfortable  HEENT: Atraumatic, normocephalic.  PERRL, EOMI.  Conjunctiva clear.  Neck supple.  No bruit.  CARDIOVASC: Regular rate and rhythm, no m/r/g.  No peripheral edema.  RESP: No respiratory distress, good air exchange, breath sounds CTAB.  ABD: Soft, NT/ND. No HSM noted.  Neuro: Alert and oriented, CN III-XII normal, 5/5 bilateral upper and lower extremity strength, normal sensation to light touch x 4, no pronator drift, normal finger-to-nose, steady gait, no nystagmus, visual fields full  PSYCH: Appropriate affect and thought  SKIN: Warm, dry, no rashes or bruising visible      EKG: nsr at 81 bpm, nl axis/intervals, no ste    UC Course and Medical Decision-making:    The patient is a 59 year old female with nausea and lightheadedness.  Normal exam here is reassuring.  Discussed  can trial zofran here and check electrolytes (though no in-house lab) but pt very anxious for more w/u than this and wants to leave so can go to ED instead.  Will defer any further w/u here as a result, esp given lack of in-house options.  [facility] ED notified.    Disposition:  Discharged    Condition: Stable    Diagnosis/Diagnoses:  Dizziness  Nausea      Lendell Caprice, MD

## 2020-01-15 NOTE — Narrator Note (Signed)
Patient Disposition  Patient education for diagnosis, medications, activity, diet and follow-up.  Patient left UC 2:43 PM.  Patient rep received written instructions.    Interpreter to provide instructions: No    Patient belongings with patient: YES    Have all existing LDAs been addressed? N/A    Have all IV infusions been stopped? N/A    Destination: Home

## 2020-01-15 NOTE — Telephone Encounter (Signed)
[place]:    Patient is calling having just left the urgent care at [facility].  Patient states they told her there was nothing they could do for her there and suggested a follow up appointment with her PCP.  Patient called her PCP's office and was transferred to the [facility].  Patient developed nausea on 01/14/2020 and was dizzy earlier today but states the dizziness has resolved.  Patient still has nausea and had diarrhea today. I scheduled patient an appointment at the [facility] at 9:45AM tomorrow 01/16/2020 and instructed her to go to the ER if her symptoms worsen this evening or she has any episodes of dizziness. Patient verbalized understanding.    I have confirmed the patient's name and DOB. Yes     Emergency care:    1. Is the patient patient gasping for air or unable to speak? No  2. Does the patient have new severe chest pain? No  3. Is the patient lethargic or does the patient have altered mental status?  No    A) COVID symptoms (criteria for testing):    Does the patient have any of the following:    1. Fever? No  2. New or worsening cough? No  3. New or worsening shortness of breath? No  4. New myalgias (muscle aches)? No  5. Anosmia (lack of smell/taste)? No  6. New sore throat? No  7. New runny nose or congestion? No  8. New vomiting or diarrhea? Yes- diarrhea onset 01/15/2020    First day of symptoms:  01/14/2020   Current day of symptoms: 1 Day    First day of dyspnea:     If dyspnea, duration of dyspnea: N/A    If fever, duration of fever: N/A    Other symptoms: Dizziness earlier today that has resolved per patient.  Nausea.    C) Need for in-person evaluation:    1. Is the patient experiencing respiratory symptoms (dypsnea, orthopnea, dyspnea on exertion, wheezing), worsening respiratory symptoms from baseline, or worsening of a chronic cough? No  2. Has the patient had a fever for > 48 hours? No  3. Is the patient at [place] ? 4 with worsening symptoms? No  4. Does the patient have  chest pain (note: new severe chest pain must be referred to ED)? No  5. Is the patient dizzy or lightheaded? No  6. Does the patient have severe sore throat? No  7. Is the patient pregnant? No    D) Need for testing:    1. Does the patient have any symptom of COVID (or is the patient an asymptomatic contact)?  Yes  2. If no to (1), does the patient desire public health testing and is a public health testing slot available within the next 5 days? N/A    Disposition:    1. I have provided the patient with Home Care Advice (use [facility]). Yes   2. I have ordered testing (for symptomatic patients not requiring in-person evaluation and asymptomatic contacts). N/A.  3. I have given the patient directions to Drive-Thru Testing (use DRIVETHRUDIRECTIONS) or [facility] (ACCDIRECTIONS), if appropriate. Yes  4. If the patient has an in-person appointment scheduled in the next 14 days (other than [facility]), I have instructed the patient that she should not attend, and have sent a message to the pertinent front desk pool:  N/A.    Final disposition: Patient referred to [facility], appointment scheduled at: 9:45AM 01/16/2020

## 2020-01-16 ENCOUNTER — Ambulatory Visit: Payer: 59 | Attending: Internal Medicine | Admitting: Physician Assistant

## 2020-01-16 ENCOUNTER — Telehealth (HOSPITAL_BASED_OUTPATIENT_CLINIC_OR_DEPARTMENT_OTHER): Payer: Self-pay | Admitting: Internal Medicine

## 2020-01-16 VITALS — BP 104/64 | HR 83 | Temp 97.2°F | Wt 142.4 lb

## 2020-01-16 DIAGNOSIS — F3340 Major depressive disorder, recurrent, in remission, unspecified: Secondary | ICD-10-CM | POA: Diagnosis not present

## 2020-01-16 DIAGNOSIS — E559 Vitamin D deficiency, unspecified: Secondary | ICD-10-CM | POA: Diagnosis not present

## 2020-01-16 DIAGNOSIS — R110 Nausea: Secondary | ICD-10-CM | POA: Insufficient documentation

## 2020-01-16 DIAGNOSIS — R42 Dizziness and giddiness: Secondary | ICD-10-CM | POA: Diagnosis not present

## 2020-01-16 DIAGNOSIS — E039 Hypothyroidism, unspecified: Secondary | ICD-10-CM | POA: Insufficient documentation

## 2020-01-16 DIAGNOSIS — Z131 Encounter for screening for diabetes mellitus: Secondary | ICD-10-CM | POA: Diagnosis present

## 2020-01-16 LAB — COMPREHENSIVE METABOLIC PANEL
ALANINE AMINOTRANSFERASE: 30 U/L (ref 12–45)
ALBUMIN: 3.9 g/dL (ref 3.4–5.0)
ALKALINE PHOSPHATASE: 83 U/L (ref 45–117)
ANION GAP: 4 mmol/L — ABNORMAL LOW (ref 5–15)
ASPARTATE AMINOTRANSFERASE: 25 U/L (ref 8–34)
BILIRUBIN TOTAL: 0.5 mg/dL (ref 0.2–1.0)
BUN (UREA NITROGEN): 13 mg/dL (ref 7–18)
CALCIUM: 9.3 mg/dL (ref 8.5–10.1)
CARBON DIOXIDE: 29 mmol/L (ref 21–32)
CHLORIDE: 105 mmol/L (ref 98–107)
CREATININE: 0.8 mg/dL (ref 0.4–1.2)
ESTIMATED GLOMERULAR FILT RATE: >60 mL/min (ref >60)
Glucose Random: 111 mg/dL (ref 74–160)
POTASSIUM: 4.3 mmol/L (ref 3.5–5.1)
SODIUM: 138 mmol/L (ref 136–145)
TOTAL PROTEIN: 7.8 g/dL (ref 6.4–8.2)

## 2020-01-16 LAB — COVID-19 OUTPATIENT: COVID-19 OUTPATIENT: NEGATIVE

## 2020-01-16 LAB — CBC WITH PLATELET
ABSOLUTE NRBC COUNT: 0 10*3/uL (ref 0.0–0.0)
HEMATOCRIT: 42 % (ref 34.1–44.9)
HEMOGLOBIN: 13.8 g/dL (ref 11.2–15.7)
MEAN CORP HGB CONC: 32.9 g/dL (ref 31.0–37.0)
MEAN CORPUSCULAR HGB: 31.2 pg (ref 26.0–34.0)
MEAN CORPUSCULAR VOL: 94.8 fl (ref 80.0–100.0)
MEAN PLATELET VOLUME: 9.8 fL (ref 8.7–12.5)
NRBC %: 0 % (ref 0.0–0.0)
PLATELET COUNT: 236 10*3/uL (ref 150–400)
RBC DISTRIBUTION WIDTH STD DEV: 43.9 fL (ref 35.1–46.3)
RED BLOOD CELL COUNT: 4.43 M/uL (ref 3.90–5.20)
WHITE BLOOD CELL COUNT: 5.4 10*3/uL (ref 4.0–11.0)

## 2020-01-16 LAB — THYROID SCREEN TSH REFLEX FT4: THYROID SCREEN TSH REFLEX FT4: 4.31 u[IU]/mL — ABNORMAL HIGH (ref 0.358–3.740)

## 2020-01-16 LAB — FREE THYROXINE: FREE THYROXINE: 0.82 ng/dL (ref 0.76–1.46)

## 2020-01-16 LAB — VITAMIN D,25 HYDROXY: VITAMIN D,25 HYDROXY: 24 ng/mL — ABNORMAL LOW (ref 30.0–100.0)

## 2020-01-16 MED ORDER — ONDANSETRON HCL 4 MG PO TABS
4.00 mg | ORAL_TABLET | Freq: Three times a day (TID) | ORAL | 0 refills | Status: AC | PRN
Start: 2020-01-16 — End: 2020-01-26

## 2020-01-16 MED ORDER — MECLIZINE HCL 25 MG PO TABS
25.0000 mg | ORAL_TABLET | Freq: Three times a day (TID) | ORAL | 0 refills | Status: AC | PRN
Start: 2020-01-16 — End: 2020-01-26

## 2020-01-16 NOTE — Progress Notes (Signed)
Acute Care Clinic Note    Subjective  Loretta Martin 59 year old English-speaking female who presents for evaluation in acute care clinic.    HPI  Coronavirus Vaccine Status: One dose of Pfizer/Moderna on 01/05/20    Patient presents with concerns of chronic episodic nausea and dizziness episodes since Jan 2021, per chart review- similar concerns date back to Aug 2020.     Reports recent episode of extreme nausea started 2 days ago with associated dizziness described as a spinning room sensation and feeling "off balanced" worsened with positional movements.   Nausea usually starts in the morning around 10am, lasts all day, strong. No vomiting this time.   Takes meds at night after dinner.     Usually episodes of dizziness, causes epigastric pain, then has diarrhea. Diarrhea described as liquid stools, 3bms per day. None this episode "yet".    Denies recent travel or sick contacts. Denies h/o covid infection.    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 - All asymptomatic.     ROS  No fevers, chills, chest pain, myalgias, coughing, SOB, abdominal pain, dysuria, hematuria, blood in stool or melena. No lower extremity swelling. No loss of smell. No new rashes or skin changes.     Medical risks for COVID complications:  Chronic lung disease    Social needs: No additional needs  If positive, consider warm handoff to nursing and/or [facility] referral    Per chart review prior to visit:  Review of Patient's Allergies indicates:   Hydrocodone-acetami*    Nausea and Vomiting   Pcn [penicillins]           Comment:Got a urine infection after using    Objective  BP 104/64    Pulse 83    Temp 97.2 F (36.2 C)    Wt 64.6 kg (142 lb 6.7 oz)    LMP 11/02/2009    SpO2 95%    BMI 26.21 kg/m        Most Recent O2 Sat Reading(s)  01/16/20 : 95%  01/15/20 : 96%  05/02/19 : 96%    Gen: no respiratory distress  HEENT:  Nares: patent  CV: RRR, no murmur  Pulm: no rhonchi,  no wheezing, no bibasilar rales  Abd: normoactive bs, soft, nontender, nondistended  Skin: good skin turgor, not diaphoretic  Neuro: alert and oriented to time, person and place. CN II-XII intact.   Psych: normal affect. Normal thought content, speech, mood are noted.    Assessment  1. Nausea  - 59 y/o female with PMHx of MDD and subclinical hypothyroidism presents with chronic episodic nausea and dizziness dating back to Aug 2020. Suspect BPPV. However will order labs to r/o electrolyte abnormality, anemia, hypothyroidism, etc. Denies any medication changes, considered Fluoxetine as a cause, however she's been on this med for 9 yrs per report. No fevers/chills/consistent diarrhea to indicate acute systemic infection. Will likely need further f/u with PCP. Consider GI etiology or referral for further eval and management if persistent/worsening.   - Start ondansetron (ZOFRAN) 4 MG tablet; Take 1 tablet by mouth every 8 (eight) hours as needed for Nausea  for up to 10 days  Dispense: 30 tablet; Refill: 0 - Side effects discussed, patient verbalized understanding.   - HELICOBACTER PYLORI STOOL AG; Future  - COVID-19 OUTPATIENT out of an abundance of precaution.    2. Dizziness  - See above  - Start meclizine (ANTIVERT) 25 MG TABS; Take  1 tablet by mouth every 8 (eight) hours as needed  for up to 10 days  Dispense: 30 tablet; Refill: 0 - Side effects discussed, patient verbalized understanding.   - Start ondansetron (ZOFRAN) 4 MG tablet; Take 1 tablet by mouth every 8 (eight) hours as needed for Nausea  for up to 10 days  Dispense: 30 tablet; Refill: 0 - Side effects discussed, patient verbalized understanding.   - CBC WITH PLATELET  - COMPREHENSIVE METABOLIC PANEL  - THYROID SCREEN TSH REFLEX FT4  - VITAMIN D,25 HYDROXY    3. Diabetes mellitus screening  - HEMOGLOBIN A1C with current labs    Plan  - Test for COVID-19 today  - Symptomatic treatment discussed   - Precautions given, including to call immediately or if new  or worsening shortness of breath, and when to seek emergency care or call 911.    Disposition  Follow up in 2-3 week(s) via pcp; chart routed to appropriate team/pool.     I spent a total of 32 minutes on this visit on the date of service (total time includes all activities performed on the date of service)    Loretta Carpenter, PA-C    CC to PCP: Berdine Addison, MD

## 2020-01-16 NOTE — Telephone Encounter (Signed)
Left msg to call back clinic    From: Lester Kinsman, PA-C On: 01/16/2020 11:21 AM   To: [facility] Pool (Pool)   Priority: Routine   Routing Comments:   Plz schedule f/u chronic nausea with PCP team in about 2-3 weeks. Televisit or in person.     Thank you,   Lester Kinsman, PA-C        From: Lester Kinsman, PA-C On: 01/16/2020 11:28 AM   To: Berdine Addison, MD   Priority: Routine   Routing Comments:   Patient seen in [facility] today. FYI only. I sent a message to [facility] front end to set up f/u- hopefully that is the correct pool. Please let me know if different.     Thank you,   Lester Kinsman, PA-C

## 2020-01-16 NOTE — Patient Instructions (Signed)
Patient Education     How to Perform the Epley Maneuver  The Epley maneuver is an exercise that relieves symptoms of vertigo. Vertigo is the feeling that you or your surroundings are moving when they are not. When you feel vertigo, you may feel like the room is spinning and may have trouble walking. The Epley maneuver is used for a type of vertigo caused by a calcium deposit in a part of the inner ear. The maneuver involves changing head positions to help the deposit move out of the area.  You can do this maneuver at home whenever you have symptoms of vertigo. You can repeat it in 24 hours if your vertigo has not gone away.  Even though the Epley maneuver may relieve your vertigo for a few weeks, it is possible that your symptoms will return. This maneuver relieves vertigo, but it does not relieve dizziness.  What are the risks?  If it is done correctly, the Epley maneuver is considered safe. Sometimes it can lead to dizziness or nausea that goes away after a short time. If you develop other symptoms--such as changes in vision, weakness, or numbness--stop doing the maneuver and call your health care provider.  Supplies needed:  · A bed or table.  · A pillow.  How to do the Epley maneuver         1. Sit on the edge of a bed or table with your back straight and your legs extended or hanging over the edge of the bed or table.  2. Turn your head halfway toward the affected ear or side as told by your health care provider.  3. Lie backward quickly with your head turned until you are lying flat on your back. You may want to position a pillow under your shoulders.  4. Hold this position for at least 30 seconds. If you feel dizzy or have symptoms of vertigo, continue to hold the position until the symptoms stop.  5. Turn your head to the opposite direction until your unaffected ear is facing the floor.  6. Hold this position for at least 30 seconds. If you feel dizzy or have symptoms of vertigo, continue to hold the  position until the symptoms stop.  7. Turn your whole body to the same side as your head so that you are positioned on your side. Your head will now be nearly facedown. Hold for at least 30 seconds. If you feel dizzy or have symptoms of vertigo, continue to hold the position until the symptoms stop.  8. Sit back up.  You can repeat the maneuver in 24 hours if your vertigo does not go away.  Follow these instructions at home:  For 24 hours after doing the Epley maneuver:  · Keep your head in an upright position.  · When lying down to sleep or rest, keep your head raised (elevated) with two or more pillows.  · Avoid excessive neck movements.  Activity  · Do not drive or use machinery if you feel dizzy.  · After doing the Epley maneuver, return to your normal activities as told by your health care provider. Ask your health care provider what activities are safe for you.  General instructions  · Drink enough fluid to keep your urine pale yellow.  · Do not drink alcohol.  · Take over-the-counter and prescription medicines only as told by your health care provider.  · Keep all follow-up visits as told by your health care provider. This is important.    Preventing vertigo symptoms  Ask your health care provider if there is anything you should do at home to prevent vertigo. He or she may recommend that you:  · Keep your head elevated with two or more pillows while you sleep.  · Do not sleep on the side of your affected ear.  · Get up slowly from bed.  · Avoid sudden movements during the day.  · Avoid extreme head positions or movement, such as looking up or bending over.  Contact a health care provider if:  · Your vertigo gets worse.  · You have other symptoms, including:  ? Nausea.  ? Vomiting.  ? Headache.  Get help right away if you:  · Have vision changes.  · Have a headache or neck pain that is severe or getting worse.  · Cannot stop vomiting.  · Have new numbness or weakness in any part of your body.  Summary  · Vertigo  is the feeling that you or your surroundings are moving when they are not.  · The Epley maneuver is an exercise that relieves symptoms of vertigo.  · If the Epley maneuver is done correctly, it is considered safe and relieves vertigo quickly.  This information is not intended to replace advice given to you by your health care provider. Make sure you discuss any questions you have with your health care provider.  Document Revised: 07/18/2019 Document Reviewed: 07/18/2019  Elsevier Patient Education © 2021 [employer]

## 2020-01-17 ENCOUNTER — Encounter (HOSPITAL_BASED_OUTPATIENT_CLINIC_OR_DEPARTMENT_OTHER): Payer: Self-pay | Admitting: Internal Medicine

## 2020-01-17 LAB — EKG

## 2020-01-17 LAB — HEMOGLOBIN A1C
ESTIMATED AVERAGE GLUCOSE: 105 (ref 74–160)
HEMOGLOBIN A1C: 5.3 % (ref 4.0–5.6)

## 2020-01-21 ENCOUNTER — Ambulatory Visit: Payer: 59 | Attending: Internal Medicine

## 2020-01-21 ENCOUNTER — Other Ambulatory Visit: Payer: Self-pay

## 2020-01-21 DIAGNOSIS — R110 Nausea: Secondary | ICD-10-CM | POA: Diagnosis not present

## 2020-01-21 NOTE — Progress Notes (Signed)
Specimen drop off

## 2020-01-22 LAB — HELICOBACTER PYLORI STOOL AG: HELICOBACTER PYLORI STOOL ANTIGEN: POSITIVE

## 2020-01-23 ENCOUNTER — Encounter (HOSPITAL_BASED_OUTPATIENT_CLINIC_OR_DEPARTMENT_OTHER): Payer: Self-pay | Admitting: Physician Assistant

## 2020-01-23 ENCOUNTER — Telehealth (HOSPITAL_BASED_OUTPATIENT_CLINIC_OR_DEPARTMENT_OTHER): Payer: Self-pay | Admitting: Physician Assistant

## 2020-01-23 DIAGNOSIS — A048 Other specified bacterial intestinal infections: Secondary | ICD-10-CM

## 2020-01-23 MED ORDER — METRONIDAZOLE 500 MG PO TABS
500.00 mg | ORAL_TABLET | Freq: Three times a day (TID) | ORAL | 0 refills | Status: AC
Start: 2020-01-23 — End: 2020-02-06

## 2020-01-23 MED ORDER — OMEPRAZOLE 20 MG PO CPDR
20.0000 mg | DELAYED_RELEASE_CAPSULE | Freq: Every day | ORAL | 0 refills | Status: DC
Start: 2020-01-23 — End: 2020-02-04

## 2020-01-23 MED ORDER — CLARITHROMYCIN 500 MG PO TABS
500.00 mg | ORAL_TABLET | Freq: Two times a day (BID) | ORAL | 0 refills | Status: AC
Start: 2020-01-23 — End: 2020-02-06

## 2020-01-23 NOTE — Telephone Encounter (Signed)
Called to discuss positive hylori test. Unable to reach LVM.     Please inform importance of compliance with ALL 3 meds as prescribed. 2 meds are bid and 1 med is tid.     Thank you,  Lester Kinsman, PA-C

## 2020-01-24 ENCOUNTER — Telehealth (HOSPITAL_BASED_OUTPATIENT_CLINIC_OR_DEPARTMENT_OTHER): Payer: Self-pay | Admitting: Internal Medicine

## 2020-01-24 ENCOUNTER — Other Ambulatory Visit: Payer: Self-pay

## 2020-01-24 ENCOUNTER — Telehealth (HOSPITAL_BASED_OUTPATIENT_CLINIC_OR_DEPARTMENT_OTHER): Payer: Self-pay | Admitting: Registered Nurse

## 2020-01-24 ENCOUNTER — Encounter (HOSPITAL_BASED_OUTPATIENT_CLINIC_OR_DEPARTMENT_OTHER): Payer: Self-pay

## 2020-01-24 ENCOUNTER — Observation Stay
Admission: EM | Admit: 2020-01-24 | Discharge: 2020-01-25 | Disposition: A | Discharge status: Home / Self Care | Payer: 59 | Source: Intra-hospital | Attending: Hospitalist | Admitting: Hospitalist

## 2020-01-24 DIAGNOSIS — N952 Postmenopausal atrophic vaginitis: Secondary | ICD-10-CM | POA: Diagnosis not present

## 2020-01-24 DIAGNOSIS — R112 Nausea with vomiting, unspecified: Secondary | ICD-10-CM | POA: Diagnosis present

## 2020-01-24 DIAGNOSIS — H81399 Other peripheral vertigo, unspecified ear: Secondary | ICD-10-CM | POA: Diagnosis not present

## 2020-01-24 DIAGNOSIS — R778 Other specified abnormalities of plasma proteins: Secondary | ICD-10-CM | POA: Diagnosis not present

## 2020-01-24 DIAGNOSIS — Z20822 Contact with and (suspected) exposure to covid-19: Secondary | ICD-10-CM

## 2020-01-24 DIAGNOSIS — R7989 Other specified abnormal findings of blood chemistry: Secondary | ICD-10-CM

## 2020-01-24 DIAGNOSIS — R42 Dizziness and giddiness: Secondary | ICD-10-CM | POA: Diagnosis not present

## 2020-01-24 DIAGNOSIS — B9681 Helicobacter pylori [H. pylori] as the cause of diseases classified elsewhere: Secondary | ICD-10-CM | POA: Diagnosis not present

## 2020-01-24 DIAGNOSIS — K2970 Gastritis, unspecified, without bleeding: Secondary | ICD-10-CM | POA: Diagnosis not present

## 2020-01-24 DIAGNOSIS — I21A1 Myocardial infarction type 2: Secondary | ICD-10-CM | POA: Diagnosis not present

## 2020-01-24 LAB — COMPREHENSIVE METABOLIC PANEL
ALANINE AMINOTRANSFERASE: 32 U/L (ref 12–45)
ALBUMIN: 4.1 g/dL (ref 3.4–5.0)
ALKALINE PHOSPHATASE: 86 U/L (ref 45–117)
ANION GAP: 6 mmol/L (ref 5–15)
ASPARTATE AMINOTRANSFERASE: 28 U/L (ref 8–34)
BILIRUBIN TOTAL: 0.4 mg/dL (ref 0.2–1.0)
BUN (UREA NITROGEN): 14 mg/dL (ref 7–18)
CALCIUM: 8.9 mg/dL (ref 8.5–10.1)
CARBON DIOXIDE: 29 mmol/L (ref 21–32)
CHLORIDE: 101 mmol/L (ref 98–107)
CREATININE: 0.7 mg/dL (ref 0.4–1.2)
ESTIMATED GLOMERULAR FILT RATE: >60 mL/min (ref >60)
Glucose Random: 171 mg/dL — ABNORMAL HIGH (ref 74–160)
POTASSIUM: 4.2 mmol/L (ref 3.5–5.1)
SODIUM: 136 mmol/L (ref 136–145)
TOTAL PROTEIN: 8.3 g/dL — ABNORMAL HIGH (ref 6.4–8.2)

## 2020-01-24 LAB — MAGNESIUM: MAGNESIUM: 2 mg/dL (ref 1.8–2.4)

## 2020-01-24 LAB — CBC, PLATELET & DIFFERENTIAL
ABSOLUTE BASO COUNT: 0 10*3/uL (ref 0.0–0.1)
ABSOLUTE EOSINOPHIL COUNT: 0 10*3/uL (ref 0.0–0.8)
ABSOLUTE IMM GRAN COUNT: 0.08 10*3/uL — ABNORMAL HIGH (ref 0.00–0.03)
ABSOLUTE LYMPH COUNT: 2 10*3/uL (ref 0.6–5.9)
ABSOLUTE MONO COUNT: 0.6 10*3/uL (ref 0.2–1.4)
ABSOLUTE NEUTROPHIL COUNT: 8.1 10*3/uL (ref 1.6–8.3)
ABSOLUTE NRBC COUNT: 0 10*3/uL (ref 0.0–0.0)
BASOPHIL %: 0.4 % (ref 0.0–1.2)
EOSINOPHIL %: 0.2 % (ref 0.0–7.0)
HEMATOCRIT: 40.5 % (ref 34.1–44.9)
HEMOGLOBIN: 13.5 g/dL (ref 11.2–15.7)
IMMATURE GRANULOCYTE %: 0.7 % — ABNORMAL HIGH (ref 0.0–0.4)
LYMPHOCYTE %: 18.2 % (ref 15.0–54.0)
MEAN CORP HGB CONC: 33.3 g/dL (ref 31.0–37.0)
MEAN CORPUSCULAR HGB: 31.3 pg (ref 26.0–34.0)
MEAN CORPUSCULAR VOL: 93.8 fl (ref 80.0–100.0)
MEAN PLATELET VOLUME: 10.2 fL (ref 8.7–12.5)
MONOCYTE %: 5.3 % (ref 4.0–13.0)
NEUTROPHIL %: 75.2 % — ABNORMAL HIGH (ref 40.0–75.0)
NRBC %: 0 % (ref 0.0–0.0)
PLATELET COUNT: 224 10*3/uL (ref 150–400)
RBC DISTRIBUTION WIDTH STD DEV: 42.5 fL (ref 35.1–46.3)
RED BLOOD CELL COUNT: 4.32 M/uL (ref 3.90–5.20)
WHITE BLOOD CELL COUNT: 10.8 10*3/uL (ref 4.0–11.0)

## 2020-01-24 LAB — TROPONIN I: TROPONIN I: 0.1 ng/mL — ABNORMAL HIGH (ref 0.00–0.04)

## 2020-01-24 LAB — LIPASE: LIPASE: 102 U/L (ref 73–393)

## 2020-01-24 MED ORDER — SODIUM CHLORIDE 0.9 % IV BOLUS
1000.0000 mL | Freq: Once | INTRAVENOUS | Status: AC
Start: 2020-01-24 — End: 2020-01-24
  Administered 2020-01-24: 1000 mL via INTRAVENOUS

## 2020-01-24 MED ORDER — FAMOTIDINE 20 MG PO TABS
20.0000 mg | ORAL_TABLET | Freq: Once | ORAL | Status: AC
Start: 2020-01-24 — End: 2020-01-24
  Administered 2020-01-24: 20 mg via ORAL
  Filled 2020-01-24: qty 1

## 2020-01-24 MED ORDER — MECLIZINE HCL 25 MG PO TABS
25.00 mg | ORAL_TABLET | Freq: Once | ORAL | Status: AC
Start: 2020-01-24 — End: 2020-01-24
  Administered 2020-01-24: 25 mg via ORAL
  Filled 2020-01-24: qty 1

## 2020-01-24 MED ORDER — ONDANSETRON HCL 4 MG/2ML IJ SOLN
4.0000 mg | Freq: Once | INTRAMUSCULAR | Status: AC
Start: 2020-01-24 — End: 2020-01-24
  Administered 2020-01-24: 4 mg via INTRAVENOUS
  Filled 2020-01-24: qty 2

## 2020-01-24 NOTE — Telephone Encounter (Signed)
Spoke with pt to discuss recent H. Pylori results and reviewed the three medications (omeprazole, Biaxin, and Flagyl) prescribed by Lester Kinsman, PA-C, pt verbalized/demonstrated understanding with medication instructions, pt does not have questions or concerns at this time

## 2020-01-24 NOTE — ED Triage Note (Addendum)
Pt self presents to ER from home c/o N/V and dizziness since 1600. Pt states she feels like her surroundings are spinning with her when standing up and becomes very Nauseated and vomited. Hx Vertigo on Antivert. Pt was treated @ [facility] last week for the the same problem. Pt is actively vomiting .

## 2020-01-24 NOTE — Telephone Encounter (Signed)
Attempted to call patient to discuss H. Pylori results and to reinforce importance of taking medication as prescribed

## 2020-01-24 NOTE — Telephone Encounter (Signed)
Patient was seen at the [facility]   Nurses attempted to reach pt regarding results no answer   Patient is now returning phone call

## 2020-01-24 NOTE — Narrator Note (Signed)
Up to BR with steady gait, no c/o dizziness.  Pt reports of feeling much better.   IVF-completed.   Repeat EKG a/o.

## 2020-01-24 NOTE — Narrator Note (Signed)
Pt eval by DR McConachie.  Resting quietly.  Pt remains pain free  Recheck VS.

## 2020-01-24 NOTE — ED Provider Notes (Signed)
EMERGENCY DEPARTMENT PROVIDER NOTE:    HPI:    Loretta Martin is a 59 year old female patient who presents for complaint of nausea, vomiting, dizziness.  Patient states that she has a prior history of dizziness issues, has been diagnosed in the past with vertigo and laryngitis.  Patient states that starting last week she has been having recurrent dizziness symptoms, provoked by certain positions of her head or if she lifts her head up from the bed.  States feels identical to when she was diagnosed with vertigo in the past.  Denies any associated focal or lateralizing neurologic deficits.  Denies any fall or close head injury.  Denies any headache.    Patient states that she also has been having some associated nausea and vomiting over the last week.  Chart review primary care visit from 01/16/2020 notes that patient has had some intermittent mild GI symptoms for past several months.  Given recent worsening they had done H. pylori testing on her stool which was positive and so she was started today on triple therapy for H. pylori do suspicion this could be causing her more ongoing GI issues.  Patient states she started the medications today, later in the day started to feel very ill, recurrent dizziness with nausea and vomiting.  She came home from work and was reportedly intractably vomiting for several hours and so came here for evaluation.    Patient denies any abdominal pain.  Denies any change in stool output.  Does have a history of chronic loose stools which has not acutely changed.  Patient denies fever or cough.  Denies any known COVID-19 exposure.  Denies any other complaints or concerns.    ROS: Pertinent positives were reviewed as per the HPI above. All other systems were reviewed and are negative.    Allergies: Review of Patient's Allergies indicates:   Hydrocodone-acetami*    Nausea and Vomiting   Pcn [penicillins]           Comment:Got a urine infection after using    Immunizations:   Immunization History    Administered Date(s) Administered    Covid-19 Vaccine [employer]) 01/05/2020    INFLUENZA VIRUS TRI W/PRESV VACCINE 18/> YRS IM (PRIVATE) 08/10/2005, 07/20/2008, 07/09/2009, 06/29/2011    Influenza Virus Quad Presv Free Vacc 6 Mo and Older, IM 09/19/2017, 06/26/2018    Influenza Virus Quad W/Presv Vacc 6 Mo and Older, IM 06/24/2014    PPD 02/01/1997    Td 08/10/2005    Tdap 03/19/2015     Garen Grams  Language of care: English  MRN: [id number]  PCP: Berdine Addison, MD  Mode of arrival to ED: Relative.  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      Comment:  treated [facility] 11/97 CSF VDRL neg 9/00 RPR 9/00 1:2 repeat                RPR yearly   08/31/2004: Nonspecific abnormal results of thyroid function study      Comment:  increased TSH 4.19 6/00; TSH nl 9/00; 05/04/99 - wnl  04/23/2009: Presbyopia  04/23/2009: PVD (posterior vitreous detachment)  08/31/2004: Trichomoniasis, unspecified  Comment:  10/97  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      Comment:  +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 - had negative CXR pp at [facility] referred to TB               clinic  08/31/2004: Urinary tract infection, site not specified      Comment:  4/02, 6/02  Patient Active Problem List:     Nonspecific reaction to tuberculin skin test without active tuberculosis     Major depressive disorder, recurrent, in remission ([place])     Mammographic microcalcification, normal BX     PVD (posterior vitreous detachment)     Asymptomatic neurocysticercosis     Female stress incontinence     Family history of diabetes mellitus type II     Seasonal allergic rhinitis     BMI 26.0-26.9,adult     Varicose veins of lower extremity     Subclinical hypothyroidism    Past Surgical History: Past  Surgical History:  No date: BREAST BIOPSY & NEEDLE LOC WIR  No date: EXCISION EXCESSIVE SKIN & SUBQ TISSUE ABDOMEN      Comment:  abdominoplasty in [place]  1998: LIG/TRNSXJ FLP TUBE ABDL/VAG APPR UNI/BI      Comment:  Tubal Ligation  No date: OB ANTEPARTUM CARE CESAREAN DLVR & POSTPARTUM      Comment:  c/section x 2  2004: REDUCTION MAMMAPLASTY  No date: TRURL RF FEMALE BLADDER NECK STRS URIN INCONT  Social History:   Social History     Socioeconomic History    Marital status: Divorced     Spouse name: Not on file    Number of children: 3    Years of education: Not on file    Highest education level: Not on file   Occupational History    Occupation: housecleaning     Employer: [employer]   Tobacco Use    Smoking status: Former Smoker     Start date: 10/05/2003     Quit date: 08/10/2004     Years since quitting: 15.4    Smokeless tobacco: Former Systems developer    Tobacco comment: smoked only for 6 months.   Substance and Sexual Activity    Alcohol use: Yes     Comment: minimal    Drug use: No    Sexual activity: Yes     Partners: Male     Birth control/protection: Surgical     Comment: men age 37 q 37, menorrhagia 2006, past history of syphillis and rx , 1st SA age 45, 9 lietime partners, separated again from her husband.   Other Topics Concern    Military Service No    Blood Transfusions No    Caffeine Concern Not Asked    Occupational Exposure No    Hobby Hazards No    Sleep Concern No    Stress Concern No    Weight Concern Yes    Special Diet No    Back Care Not Asked    Exercise No     Comment: not much    Bike Helmet Not Asked    Seat Belt Yes    Self-Exams Not Asked   Social History Narrative    To [place] from [place] in [place]. 3 children, all adults. Husband Jose. Housekeeper. Prior [facility] patient.   Social Determinants of Health  Financial Resource Strain:     Difficulty of Paying Living Expenses:   Food Insecurity:     Worried About Charity fundraiser in the Last Year:  Ran Out  of Food in the Last Year:   Transportation Needs:     Film/video editor (Medical):     Lack of Transportation (Non-Medical):   Physical Activity:     Days of Exercise per Week:     Minutes of Exercise per Session:   Stress:     Feeling of Stress :   Social Connections:     Frequency of Communication with Friends and Family:     Frequency of Social Gatherings with Friends and Family:     Attends Religious Services:     Active Member of Clubs or Organizations:     Attends Archivist Meetings:     Marital Status:   Intimate Partner Violence:     Fear of Current or Ex-Partner:     Emotionally Abused:     Physically Abused:     Sexually Abused:           PHYSICAL EXAM (ED Bed 16/16-A):   Patient Vitals for the past 999 hrs:   BP Temp Pulse Resp SpO2   01/25/20 0049 139/87 -- 93 15 97 %   01/24/20 2300 133/84 -- 88 15 96 %   01/24/20 2256 137/95 -- 90 18 97 %   01/24/20 2225 139/91 -- 91 16 97 %   01/24/20 2124 (!) 150/104 98 F 94 16 96 %       GENERAL: Patient alert, interactive, appears in no acute distress  HEAD: No visible nor palpable signs of trauma.  EYES: Conjunctiva noninjected, sclera nonicteric  LUNGS:  Clear to auscultation bilaterally. No wheezes, rales, rhonchi.   HEART: Regular rate and rhythm.  No murmurs, rubs, or gallops.   ABDOMEN: Soft, nontender, no rebound, no guarding.  Negative Murphy's.  No CVA tenderness bilaterally.  EXTREMITIES:  No obvious deformities. No cyanosis. No edema.   SKIN: No notable rash or lesions except if noted above  PSYCHIATRIC:  Appropriate for age, time of day, and situation  NEURO: Patient alert, interactive, oriented x 3. CN 2-12 intact bilaterally.  No appreciable drift bilateral upper and lower extremities.  Intact DTR bilaterally. Finger to nose intact bilaterally.     Medications Given in the ED:    Medications   sodium chloride 0.9 % IV bolus 1,000 mL (0 mLs Intravenous Stopped 01/24/20 2352)   ondansetron (ZOFRAN) injection  4 mg (4 mg Intravenous Given 01/24/20 2258)   meclizine (ANTIVERT) tablet 25 mg (25 mg Oral Given 01/24/20 2259)   famotidine (PEPCID) tablet 20 mg (20 mg Oral Given 01/24/20 2259)   aspirin tablet 325 mg (325 mg Oral Given 01/25/20 0051)    Radiology Results:       Lab Results:     Labs Reviewed   CBC, PLATELET & DIFFERENTIAL - Abnormal; Notable for the following components:       Result Value    NEUTROPHIL % 75.2 (*)     IMMATURE GRANULOCYTE % 0.7 (*)     ABSOLUTE IMM GRAN COUNT 0.08 (*)     All other components within normal limits   COMPREHENSIVE METABOLIC PANEL - Abnormal; Notable for the following components:    Glucose Random 171 (*)     TOTAL PROTEIN 8.3 (*)     All other components within normal limits   TROPONIN I - Abnormal; Notable for the following components:    TROPONIN I 0.10 (*)     All other components within normal limits   MAGNESIUM   LIPASE  RESPIRATORY PANEL BASIC                 EKG: Sinus rhythm at rate of 97.  No significant ST deviations.  T wave inversion aVR.  When compared to prior of January 15, 2020 T wave now slightly more flattened in V1 versus prior no other acute changes, T wave morphology otherwise similar to prior.    Repeat EKG was done given positive troponin.  Shows sinus rhythm at rate of 91.  No significant ST deviations.  T waves downgoing in aVR, V1.  V1 now is slightly more downgoing appearance more similar to old EKG.  No other concomitant EKG changes appreciated.  May be due to slight variation in lead placement as EKGs were done at least an hour apart and leads likely had been removed and replaced.    ED COURSE AND MEDICAL DECISION MAKING:  Patient presents for 2 issues, spinning dizziness and nausea and vomiting.  Patient has a prior history of vertigo per her, has been told other times that she has laminitis.  Patient has not had any recent infectious illness I think this is less likely to be labyrinth-itis.  Given exquisite reproduction with specific head position and  movement as well as lack of associated neurologic complaints I think that this is primarily suspicious for peripheral vertigo and so I treated patient symptomatically with Zofran and meclizine with improvement although not complete resolution on reevaluation.    Patient's symptoms today of nausea and vomiting in setting of more ongoing GI issues appear to potentially been provoked by starting triple therapy for H. pylori today but I was concerned as patient is a middle-aged woman that this could be an atypical presentation of a cardiac complaint and so I did do a screening troponin which was abnormal at 0.1.  I advised patient that we should admit her for further cardiac evaluation, serial troponins and she was amenable to same.  I have ordered a dose of aspirin but deferred heparin at this time given unclear chronicity of this elevation, would plan to trend troponin and consider addition if troponin is elevating.    I discussed with patient plan for admission for further work-up and she is amenable to same.  I spoke to hospitalist who has accepted patient to their service.  Remainder of management will be as per them.           I have had a conversation with the patient and or family designee and they are in agreement with the decision to admit.     DISPOSITION: Admit To [facility]    CONDITION ON DISPOSITION:  Stable    DIAGNOSIS/DIAGNOSES:  Dizzy  Nausea and vomiting, intractability of vomiting not specified, unspecified vomiting type  Elevated troponin      Cathleen Corti, MD, [place], Wilber Oliphant  Attending Physician, [facility]    This Emergency Department patient encounter note was created using voice-recognition software.

## 2020-01-25 ENCOUNTER — Ambulatory Visit: Payer: 59 | Attending: Family Medicine | Admitting: Family Medicine

## 2020-01-25 ENCOUNTER — Encounter (HOSPITAL_BASED_OUTPATIENT_CLINIC_OR_DEPARTMENT_OTHER): Payer: Self-pay

## 2020-01-25 ENCOUNTER — Observation Stay (HOSPITAL_BASED_OUTPATIENT_CLINIC_OR_DEPARTMENT_OTHER): Payer: 59

## 2020-01-25 VITALS — BP 96/60 | HR 89 | Temp 98.3°F | Wt 144.8 lb

## 2020-01-25 DIAGNOSIS — I214 Non-ST elevation (NSTEMI) myocardial infarction: Secondary | ICD-10-CM

## 2020-01-25 DIAGNOSIS — R42 Dizziness and giddiness: Secondary | ICD-10-CM

## 2020-01-25 DIAGNOSIS — R7989 Other specified abnormal findings of blood chemistry: Secondary | ICD-10-CM

## 2020-01-25 DIAGNOSIS — R109 Unspecified abdominal pain: Secondary | ICD-10-CM

## 2020-01-25 DIAGNOSIS — I252 Old myocardial infarction: Secondary | ICD-10-CM | POA: Diagnosis present

## 2020-01-25 DIAGNOSIS — R112 Nausea with vomiting, unspecified: Secondary | ICD-10-CM

## 2020-01-25 DIAGNOSIS — F329 Major depressive disorder, single episode, unspecified: Secondary | ICD-10-CM

## 2020-01-25 DIAGNOSIS — N952 Postmenopausal atrophic vaginitis: Secondary | ICD-10-CM

## 2020-01-25 DIAGNOSIS — A048 Other specified bacterial intestinal infections: Secondary | ICD-10-CM | POA: Diagnosis not present

## 2020-01-25 DIAGNOSIS — R778 Other specified abnormalities of plasma proteins: Secondary | ICD-10-CM | POA: Diagnosis not present

## 2020-01-25 LAB — RESPIRATORY PANEL EXTENDED
ADENOVIRUS: NEGATIVE
CHLAMYDIA PNEUMONIAE: NEGATIVE
CORONA(229E, HKU1, NL63, OC43): NEGATIVE
HUMAN METAPNEUMOVIRUS: NEGATIVE
HUMAN RHINOVIRUS ENTEROVIRUS: NEGATIVE
INFLUENZA A H1-2009: NEGATIVE
INFLUENZA A H1: NEGATIVE
INFLUENZA A H3: NEGATIVE
INFLUENZA A: NEGATIVE
INFLUENZA B: NEGATIVE
MYCOPLASMA PNEUMONIAE: NEGATIVE
PARAINFLUENZA 1: NEGATIVE
PARAINFLUENZA 2: NEGATIVE
PARAINFLUENZA 3: NEGATIVE
PARAINFLUENZA 4: NEGATIVE
RESPIRATORY SYNCYTIAL VIRUS A: NEGATIVE
RESPIRATORY SYNCYTIAL VIRUS B: NEGATIVE
SARS-COV-2: NEGATIVE

## 2020-01-25 LAB — TROPONIN I
TROPONIN I: 0.22 ng/mL — ABNORMAL HIGH (ref 0.00–0.04)
TROPONIN I: 0.19 ng/mL — ABNORMAL HIGH (ref 0.00–0.04)

## 2020-01-25 LAB — LIPID PANEL
Cholesterol: 255 mg/dL (ref 0–239)
HIGH DENSITY LIPOPROTEIN: 60 mg/dL (ref 40–?)
LOW DENSITY LIPOPROTEIN DIRECT: 174 mg/dL (ref 0–189)
TRIGLYCERIDES: 60 mg/dL (ref 0–150)

## 2020-01-25 LAB — ECHOCARDIOGRAM W/ ULTRASOUND CONTRAST: LVEF: 65 %

## 2020-01-25 LAB — APTT: APTT: 26.9 SECONDS (ref 26.9–38.0)

## 2020-01-25 LAB — PROTHROMBIN TIME
INR: 1.1 (ref 2.0–3.5)
PROTHROMBIN TIME: 12.3 SECONDS (ref 9.6–12.3)

## 2020-01-25 MED ORDER — PERFLUTREN PROTEIN A MICROSPH IV SUSP
INTRAVENOUS | Status: AC
Start: 2020-01-25 — End: 2020-01-25
  Administered 2020-01-25: 3 mL via INTRAVENOUS
  Filled 2020-01-25: qty 3

## 2020-01-25 MED ORDER — ASPIRIN 325 MG PO TABS
325.00 mg | ORAL_TABLET | Freq: Once | ORAL | Status: AC
Start: 2020-01-25 — End: 2020-01-25
  Administered 2020-01-25: 325 mg via ORAL
  Filled 2020-01-25: qty 1

## 2020-01-25 MED ORDER — ATORVASTATIN CALCIUM 80 MG PO TABS
80.0000 mg | ORAL_TABLET | Freq: Every day | ORAL | Status: DC
Start: 2020-01-25 — End: 2020-01-25
  Administered 2020-01-25: 80 mg via ORAL
  Filled 2020-01-25: qty 1

## 2020-01-25 MED ORDER — PERFLUTREN DILUTED IV BOLUS
1.0000 mL | Status: DC
Start: 2020-01-25 — End: 2020-01-25

## 2020-01-25 MED ORDER — FLUOXETINE HCL 20 MG PO CAPS
40.0000 mg | ORAL_CAPSULE | Freq: Every day | ORAL | Status: DC
Start: 2020-01-25 — End: 2020-01-25
  Administered 2020-01-25: 40 mg via ORAL
  Filled 2020-01-25: qty 2

## 2020-01-25 MED ORDER — ATORVASTATIN CALCIUM 20 MG PO TABS
20.0000 mg | ORAL_TABLET | Freq: Every day | ORAL | 0 refills | Status: DC
Start: 2020-01-25 — End: 2020-03-12

## 2020-01-25 MED ORDER — SACCHAROMYCES BOULARDII 250 MG PO CAPS
250.00 mg | ORAL_CAPSULE | Freq: Two times a day (BID) | ORAL | 0 refills | Status: AC
Start: 2020-01-25 — End: 2020-02-08

## 2020-01-25 MED ORDER — HEPARIN SODIUM (PORCINE) 5000 UNIT/ML IJ SOLN
0.0000 [IU] | INTRAMUSCULAR | Status: DC
Start: 2020-01-25 — End: 2020-01-25

## 2020-01-25 MED ORDER — METOPROLOL SUCCINATE ER 25 MG PO TB24
25.0000 mg | ORAL_TABLET | Freq: Every day | ORAL | 0 refills | Status: DC
Start: 2020-01-26 — End: 2022-08-09

## 2020-01-25 MED ORDER — METOPROLOL SUCCINATE ER 25 MG PO TB24
25.0000 mg | ORAL_TABLET | Freq: Every day | ORAL | Status: DC
Start: 2020-01-25 — End: 2020-01-25
  Administered 2020-01-25: 25 mg via ORAL
  Filled 2020-01-25: qty 1

## 2020-01-25 MED ORDER — PERFLUTREN PROTEIN A MICROSPH IV SUSP
INTRAVENOUS | Status: AC
Start: 2020-01-25 — End: 2020-01-25
  Administered 2020-01-25: 4 mL via INTRAVENOUS
  Filled 2020-01-25: qty 3

## 2020-01-25 MED ORDER — MECLIZINE HCL 25 MG PO TABS
25.0000 mg | ORAL_TABLET | Freq: Three times a day (TID) | ORAL | Status: DC | PRN
Start: 2020-01-25 — End: 2020-01-25

## 2020-01-25 MED ORDER — HEPARIN SODIUM (PORCINE) 5000 UNIT/ML IJ SOLN
5000.0000 [IU] | Freq: Two times a day (BID) | INTRAMUSCULAR | Status: DC
Start: 2020-01-25 — End: 2020-01-25

## 2020-01-25 MED ORDER — ONDANSETRON HCL 4 MG/2ML IJ SOLN
4.0000 mg | Freq: Four times a day (QID) | INTRAMUSCULAR | Status: DC | PRN
Start: 2020-01-25 — End: 2020-01-25

## 2020-01-25 MED ORDER — ONDANSETRON HCL 4 MG PO TABS
4.00 mg | ORAL_TABLET | Freq: Three times a day (TID) | ORAL | 2 refills | Status: AC | PRN
Start: 2020-01-25 — End: 2020-02-24

## 2020-01-25 MED ORDER — CLARITHROMYCIN 250 MG PO TABS
500.0000 mg | ORAL_TABLET | Freq: Two times a day (BID) | ORAL | Status: DC
Start: 2020-01-25 — End: 2020-01-25
  Administered 2020-01-25: 500 mg via ORAL
  Filled 2020-01-25: qty 2

## 2020-01-25 MED ORDER — METRONIDAZOLE 500 MG PO TABS
500.0000 mg | ORAL_TABLET | Freq: Three times a day (TID) | ORAL | Status: DC
Start: 2020-01-25 — End: 2020-01-25
  Administered 2020-01-25 (×2): 500 mg via ORAL
  Filled 2020-01-25 (×2): qty 1

## 2020-01-25 MED ORDER — PANTOPRAZOLE SODIUM 40 MG IV SOLR
40.0000 mg | Freq: Every day | INTRAVENOUS | Status: DC
Start: 2020-01-25 — End: 2020-01-25
  Administered 2020-01-25: 40 mg via INTRAVENOUS
  Filled 2020-01-25: qty 40

## 2020-01-25 MED ORDER — HEPARIN SOD (PORCINE) IN D5W 25000-5 UT/500ML-% IV SOLN
0.0000 [IU]/h | INTRAVENOUS | Status: DC
Start: 2020-01-25 — End: 2020-01-25
  Administered 2020-01-25: 800 [IU]/h via INTRAVENOUS
  Filled 2020-01-25: qty 500

## 2020-01-25 MED FILL — ATORVASTATIN 20MG: 30 days supply | Qty: 30 | Fill #0 | Status: CP

## 2020-01-25 MED FILL — METOPROL SUC 25MG ER: 30 days supply | Qty: 30 | Fill #0 | Status: CP

## 2020-01-25 NOTE — Narrator Note (Signed)
Up to BR with steady gait, no dizziness, N/V.   Pt swabbed for the Covid 19,m tol well.,

## 2020-01-25 NOTE — RN Shift Note (Signed)
Patient second troponin came back 0.22 hospitalists was notified. Patient is on telemetry and remained NSR. She denies chest pain and any n/v. New orders was placed and PTT, PT and PLT was drawn and sent to lab. She will be start of heparin and awaiting for orders. Will continue to monitor. Cleophus Molt, RN

## 2020-01-25 NOTE — Discharge Inst - Reason you came to the hospital (Addendum)
Dear Loretta Martin,    Reason you came to the hospital: You came to the hospital for nausea, vomiting, and dizziness.    Discharge Diagnosis:   - Type 2 NSTEMI  - Vertigo    Description of Hospital Course: Your nausea, vomiting and dizziness improved with a medication called meclizine. Your blood test (troponin level) was concerning for an acute heart problem. You had imaging of your heart (echocardiogram) that was reassuring and did not find signs of a heart attack. You were discharged with 2 new medications, a statin (atorvastatin 20 mg) and beta blocker (metoprolol).    Please start taking your 2 new medications. Please take your statin (1 pill each day) and beta blocker (1 pill each day).  Please [facility] with [facility] in 1 month.  Please follow-up with your Primary Care doctor within 1 week.     Please return to the hospital if you have any chest pain, problems breathing, leg swelling, or other concerning symptoms. Pay attention to these symptoms and call your primary doctor if you experience them. If your symptoms are severe, go immediately to the emergency room.      Important Results: Your echocardiogram (ultrasound image of your heart) did not show any concerning findings. Your COVID test was negative.    Reasons to call your PCP:  1. If you have any questions about your medical issues  2. If you have any problems taking your medications at home  3. If you have chest pain, difficulty breathing or notice leg swelling    If you have shortness of breath, chest pain, increasing leg swelling, fever, chills or other concerning symptoms, please return to the emergency department.    It was a pleasure to take care of you while you were in the hospital.    Best wishes,  [facility]

## 2020-01-25 NOTE — Narrator Note (Signed)
Report given to RN and [place] and accepts the pt.

## 2020-01-25 NOTE — Plan of Care (Signed)
Pt A+Ox4, denies any pain. Pt on heparin drip at 16 ml/hr at 600 units/hr in right lower forearm IV. Pt independent in room. On telemonitor NSR, HR 89. On O2 monitor 97% on room air.   1000: heparin drip discontinued per Dr. Johnnette Gourd Lu orders.  1340 - 1400: Pt given discharge instructions regarding medications, follow up appointments, and when to call PCP/911. Pt verbalizes understanding. IV discontinued in Right forearm, gauze and tape applied. Pt signs discharge paperwork  Problem: Daily Care  Goal: Daily care needs are met  Description: Assess and monitor ability to perform self care and identify potential discharge needs.  Outcome: Adequate for Discharge     Problem: Pain  Goal: Patient's pain/discomfort is manageable  Description: Assess and monitor patient's pain using appropriate pain scale. Collaborate with interdisciplinary team and initiate plan and interventions as ordered. Re-assess patient's pain level 30 - 60 minutes after pain management intervention.   Outcome: Adequate for Discharge     Problem: Knowledge Deficit  Goal: Patient/S.O. demonstrates understanding of disease process, treatment plan, medications, and discharge instructions.  Description: Complete learning assessment and assess knowledge base  Outcome: Adequate for Discharge

## 2020-01-25 NOTE — Narrator Note (Signed)
Pt is sleeping @ this time.  Arousable.  No c/o pain or discomfort.   VS remains stable.

## 2020-01-25 NOTE — Event Note (Signed)
This is a 59 yo lady who presents with nausea/vomiting and abdominal pain in the setting of known history of intermittent BPPV and started triple therapy for H. Pylori in stool approx 48h prior to presentation, who remained inpatient after a screening troponin was elevated to 0.10 without any chest pain or ischemic changes.     I requested a repeat troponin at 0500, approx 6 hrs after initial one. Around 0545 it resulted at 0.22. Pt remains to be chest pain free with no changes on tele. I paged Dr. Rolena Infante who recommended NPO status, heparin gtt, and 2D echo. I saw and examined the pt at bedside who was sleeping comfortably and had no complaints including no dizziness and no nausea, no chest pain, no abdominal pain. I explained the findings to her and the plan for her care. She appeared agreeable. Will f/u with cards in a.m.

## 2020-01-25 NOTE — Discharge Inst - Page 1 Instructions (Signed)
{  DISCHARGE INSTR [facility] or SNF:[id number]}

## 2020-01-25 NOTE — Plan of Care (Signed)
Patient is alert and oriented x3. [place] speaking but was able to get thru the assessment in [place]. Vital signs stable. Denies pain. Skin is intact. Lungs sounds clear. On telemetry nsr. She is ambulatory in the room. Will continue to monitor. Cleophus Molt, RN    Problem: Daily Care  Goal: Daily care needs are met  Description: Assess and monitor ability to perform self care and identify potential discharge needs.  Outcome: Progressing     Problem: Pain  Goal: Patient's pain/discomfort is manageable  Description: Assess and monitor patient's pain using appropriate pain scale. Collaborate with interdisciplinary team and initiate plan and interventions as ordered. Re-assess patient's pain level 30 - 60 minutes after pain management intervention.   Outcome: Progressing     Problem: Knowledge Deficit  Goal: Patient/S.O. demonstrates understanding of disease process, treatment plan, medications, and discharge instructions.  Description: Complete learning assessment and assess knowledge base  Outcome: Progressing

## 2020-01-25 NOTE — ED Notes (Signed)
Repeat EKG done.

## 2020-01-25 NOTE — Narrator Note (Signed)
Patient Disposition  Patient education for diagnosis, medications, activity, diet and follow-up.  Patient left ED 4:03 AM.  Patient rep received written instructions.    Interpreter to provide instructions: No    Patient belongings with patient: YES    Have all existing LDAs been addressed? Yes    Have all IV infusions been stopped? Yes    Destination: Admit to:  monitored bed accompanied with PAR, Pt may travel off cardiac monitor per MD

## 2020-01-25 NOTE — Progress Notes (Signed)
CM discussed pt in MDR, reviewed EMR. Pt is a  59 yo F with a history of intermittent nausea/vomiting and abdominal found recently found to be H. Pylori positive and started on triple antibiotics who presents with worsening GI symptoms today, incidentally found to have an elevated troponin, now for ACS r/o, admitted 4/22 with an NSTEMI, on heparin drip. Pt lives in an apt in [place], is independent. Pt was medically cleared for d/c today.

## 2020-01-25 NOTE — Discharge Summary (Addendum)
Physician Discharge Summary     Patient ID:  Name Loretta Martin  MRN [id number]   Age 59 year old  DOB 1960-12-11    Admit Date: 01/25/20    Discharge Date and Time: 01/25/20    Admitting Physician: Birdie Riddle, MD  Discharge Physicians:    Intern: Ernestina Penna, MS4   Resident: Natasha Bence, MD   Attending: Perlie Mayo, MD    Discharge Diagnoses:   Type 2 NSTEMI  Vertigo, peripheral  H. Pylori gastritis    Issues for Follow up:   [ ]  Type 2 NSTEMI: check if taking atorvastatin 20mg , metoprolol 25mg   [ ]  Type 2 NSTEMI: cardiology f/u 6/9 with Dr. 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      Comment:  treated [facility] 11/97 CSF VDRL neg 9/00 RPR 9/00 1:2 repeat                RPR yearly   08/31/2004: Nonspecific abnormal results of thyroid function study      Comment:  increased TSH 4.19 6/00; TSH nl 9/00; 05/04/99 - wnl  04/23/2009: Presbyopia  04/23/2009: PVD (posterior vitreous detachment)  08/31/2004: Trichomoniasis, unspecified      Comment:  10/97  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 Pylori infection about 2 days ago, who presents to [facility] ED on 01/24/2020 c/o nausea/vomiting and dizziness at home.  When it comes on, it is severe; she describes it as the room spinning around  her, lasts for minutes to hours, a/w nausea and vomiting. Appears to be provoked by turning her head to the left. No apparent a/w tinnitus or recent URI or other infection, or hearing changes. The patient states that she has had a long-standing history of dizziness, which is corroborated by review of medical record. This is not the worst paroxysm of symptoms she's experienced in the past, but commensurate with her prior episodes. She appears to have followed with her PMD for management and previously received meclizine for a Meniere disease diagnosis, but has not undergone any more formal evaluation. She states that she came to the ED today because she had vomiting that was unremitting at home; last vomiting was earlier in the evening, on presentation to the department, prior to any intervention.      Review of medical record further shows that she has pursued workup for the intermittent but persistent nausea over the last year or so and underwent stool testing for H. Pylori, which just resulted as positive. She was started on triple antibiotics yesterday, which  seems to be associated with recrudescence of symptoms.      In ED, VS: BP 150/104 HR 94 RR 16 SpO2 96% ORA T 26F. She received meclizine for c/o dizziness and her symptoms did abate. Screening labs were significant for troponin elevated to 0.10. The patient had no chest pain throughout her ED course. She had 2 EKG with NSR and no acute ST-T wave changes, new conduction delay, or other signs of ischemia.      At the time of my interview and exam the patient reported feeling well and much improved, but remained mildly anxious about the cause of her symptoms. I did explain her projected hospital course in detail. The patient has no personal or family history of heart disease. She is a former tobacco user for 6 months and she quit >10 yrs ago. She has no diabetes, or hypertension; an A1C earlier this month shows she does not have diabetes. She has a low HEART  score but given age and presentation including abdominal pain and positive troponin she will be observed overnight for serial trops and likely outpatient cardiology referral.      At this time as well I have a low suspicion for central cause of vertigo, appears to be consistent with peripheral, if no relief with meclizine can try Valium, but given severity of symptoms and how long she's had them for, may benefit from neuro/ENT referral and possible w/u including head CT or MRI for further evaluation of posterior fossa. At this time the patient no longer had symptoms and so a HINTS exam was not performed. At the time of my interview the pt did not c/o headache or ever notice a headache a/w her symptoms, and she has no unilateral facial deficits to suggest vertebral artery dissection. She has no cerebellar signs on my exam including no dysdadochokinesia and I have a lower suspicion for posterior cerebellar syndromes.      Otherwise she received 1 dose of covid-19 vaccine on 04/03 and states she is to receive the second dose on 04/24.     Hospital Course:     # Type 2 NSTEMI  P/w nausea, vomiting, vertigo with elevated screening troponins (0.1 -> 0.22 -> 0.19) iso normal renal function. Patient without chest pain, few risk factors and no known FHx early cardiac disease. EKG non-ischemic. Started on heparin gtt, aspirin. Stress test without EKG or echocardiographic e/o inducible ischemia. Discharged on atorvastatin 20mg  and metoprolol 25mg  with cardiology follow-up 6/9 with Dr. Lavada Mesi.  - started on atorvastatin 20mg  and metoprolol 25mg   [ ]  Type 2 NSTEMI: check if taking atorvastatin 20mg , metoprolol 25mg   [ ]  Type 2 NSTEMI: cardiology f/u 6/9 with Dr. Lavada Mesi     # Vertigo  Longstanding intermittent dizziness with improvement after meclizine in ED. Most likely BPPV, symptoms provoked by head turning to the left and associated with n/v. No associated tinnitus, hearing loss, no recent infection. HINTS exam deferred as pt  no longer actively symptomatic. No cerebellar signs on exam.   [ ]  Vertigo: outpatient follow-up with neuro/ENT  [ ]  Vertigo: consider CT head or MRI if worsening sx or c/f cerebellar stroke or mass  [ ]  outpatient follow-up with neuro/ENT  [ ]  consider CT head or MRI if worsening sx or c/f cerebellar stroke or mass    # H. Pylori infection  Recent diagnosis of H. Pylori (stool testing) and day 2 of triple antibiotic therapy, which may be trigger for n/v. Continued triple therapy with clarithromycin,  flagyl, protonix. Prescribed zofran for nausea symptoms  - continue triple therapy     # h/o major depressive d/o  -euthymic here, c/w home fluoxetine      #h/o atrophic vaginitis  -pt is well established with outpatient GYN      Key labs, imaging, other tests:   BMP:   Recent Labs     01/24/20  2245   NA 136   K 4.2   CL 101   CO2 29   BUN 14   CREAT 0.7   GLUCOSER 171*     Ca, Mg, Phos:   Recent Labs     01/24/20  2245   CA 8.9   MG 2.0     CBC:   Recent Labs     01/24/20  2245   WBC 10.8   HGB 13.5   HCT 40.5   PLTA 224   RBC 4.32     Coagulation Labs:   Recent Labs     01/25/20  0615   PT 12.3   INR 1.1     LFTs:   Recent Labs     01/24/20  2245   AST 28   ALT 32   TBILI 0.4   ALKPHOS 86     Pancreatic Enzymes:   Recent Labs     01/24/20  2245   LIP 102     Urinalysis:No results for input(s): UACOL, UACLA, UAGLU, UABIL, UAKET, SPEGRAVURINE, UAOCC, UAPH, UAPRO, UANIT, LEUKOCYTES in the last 72 hours.    Invalid input(s): UAOBU  Troponin:   Recent Labs     01/24/20  2245 01/25/20  0500 01/25/20  1055   TROPI 0.10* 0.22* 0.19*       IMAGING:  Cardiac Stress Echo  1. Stress Parameter: Bruce Protocol. Duration 5.0 min. Baseline HR 80 bpm. Peak HR 145 bpm. METs achieved: 7.02. % Max HR 90.0 %.   2. Hemodynamic Response: Normal functional capacity for age and sex.   3. No EKG or echocardiographic evidence of inducible ischemia at achieved workload.    Discharge Exam:   BP 136/82    Pulse 89    Temp 99 F (37.2 C) (Oral)     Resp 18    Wt 66.1 kg (145 lb 11.6 oz)    LMP 11/02/2009    SpO2 95%    BMI 26.82 kg/m    General: AOx3, NAD  Head: atraumatic  ENT: PERRLA, EOMI, sclerae anicteric, moist mucous membranes, no rhinorrhea  Neck: Supple, no JVD  Cardiovascular: S1S2 only no m/r/g, peripheral pulses intact  Respiratory: bronchovesicular sounds only no wheezes/rales/rhonchi  GI: abdomen soft, nontender, nondistended  MSK: no peripheral edema, no trauma  Neuro: CN II-XII grossly intact, moves all extremities freely and to command  Psych: alert, pleasant, cooperative    Code Status at Admission: Full Code  Code Status at Discharge: Full Code    Healthcare Proxy and Phone Number:   Arlindo Pascoal Xela Antoun (son), [phone]    Discharge Medication List Noting Changes:   Current Discharge Medication List    START taking these medications    atorvastatin (LIPITOR) 20 MG tablet  Take 1 tablet by mouth daily  Qty: 30 tablet Refills: 0    metoprolol (TOPROL-XL) 25 MG 24 hr tablet  Take 1 tablet by mouth daily  Qty: 30 tablet Refills: 0    !! ondansetron (ZOFRAN) 4 MG tablet  Take 1 tablet by mouth every 8 (eight) hours as needed for  Nausea  Qty: 30 tablet Refills: 2    !! - Potential duplicate medications found. Please discuss with provider.      CONTINUE these medications which have NOT CHANGED    omeprazole (PRILOSEC) 20 MG capsule  Take 1 capsule by mouth daily  for 14 days  Qty: 14 capsule Refills: 0  Associated Diagnoses:Helicobacter pylori infection    clarithromycin (BIAXIN) 500 MG tablet  Take 1 tablet by mouth 2 (two) times daily  for 14 days  Qty: 28 tablet Refills: 0  Associated Diagnoses:Helicobacter pylori infection    metroNIDAZOLE (FLAGYL) 500 MG tablet  Take 1 tablet by mouth 3 (three) times daily Avoid use with alcohol.  for 14 days  Qty: 42 tablet Refills: 0  Associated Diagnoses:Helicobacter pylori infection    meclizine (ANTIVERT) 25 MG TABS  Take 1 tablet by mouth every 8 (eight) hours as needed  for up to 10  days  Qty: 30 tablet Refills: 0  Associated Diagnoses:Dizziness    !! ondansetron (ZOFRAN) 4 MG tablet  Take 1 tablet by mouth every 8 (eight) hours as needed for Nausea  for up to 10 days  Qty: 30 tablet Refills: 0  Associated Diagnoses:Dizziness; Nausea    PROAIR HFA 108 (90 Base) MCG/ACT inhaler  Inhale 1 puff into the lungs every 4 (four) hours as needed for Wheezing  Qty: 3 Inhaler Refills: 0  Associated Diagnoses:Seasonal allergic rhinitis due to pollen    estradiol (ESTRING) 2 MG vaginal ring  PLACE 2 MILLIGRAM VAGINALLY EVERY 3 MONTHS  Qty: 1 each Refills: 3    VITAMIN D-400 10 MCG (400 UNIT) TABS Tablet  TAKE 1 TABLET BY MOUTH DAILY  Qty: 30 tablet Refills: 11    FLUoxetine (PROZAC) 20 MG capsule  TAKE 2 CAPSULES BY MOUTH EVERY DAY  Qty: 180 capsule Refills: 3    fluticasone (FLONASE) 50 MCG/ACT nasal spray  2 sprays by Each Nostril route daily  Qty: 1 Bottle Refills: 11  Associated Diagnoses:Seasonal allergic rhinitis due to pollen    Multiple Vitamin (MULTIVITAMIN) TABS  Take 1 tablet by mouth daily.    omega-3 acid ethyl esters (LOVAZA) 1 GM capsule  Take 2 g by mouth 2 (two) times daily.    !! - Potential duplicate medications found. Please discuss with provider.      STOP taking these medications    naproxen (NAPROSYN) 375 MG tablet    Estradiol (ESTROGEL) 0.75 MG/1.25 GM (0.06%) topical gel            Weight bearing status: no restrictions  Diet: cardiac diet  Wound Care: none needed    Discharge Destination: home    Future appointments:   Current and Future Appointments at [facility] (90 Days) 01/25/2020 - 04/24/2020        Date Visit Type Length Department Provider     02/12/2020  1:20 PM TELEVISIT 20 min [place] [id number] Berdine Addison, MD    Patient Instructions:     You have a televisit appointment with your [facility] provider in the next few days. A televisit is an appointment by telephone or video. If this is a phone visit, someone will call you at your  appointment time. If this is a video visit, you will have to join the video at your appointment time. If you need help setting up your phone or computer for video visits, please see our user guide or call [phone] at least 24 hours before your visit.  03/12/2020  4:40 PM TELEVISIT 20 min [facility] [id number] Teodoro Kil, MD    Patient Instructions:     You have a televisit appointment with your [facility] provider in the next few days. A televisit is an appointment by telephone or video. If this is a phone visit, someone will call you at your appointment time. If this is a video visit, you will have to join the video at your appointment time. If you need help setting up your phone or computer for video visits, please see our user guide or call [phone] at least 24 hours before your visit.                          Follow-up Information       Follow up With Specialties Details Why Contact Info    Berdine Addison, MD Internal Medicine   [name] [address]  [place], MD Internal Medicine   [address]  [name] [address]  [place]      Teodoro Kil, MD Cardiology On 03/12/2020 6/9 @4 :40pm televisit [place] [id number]  [phone]  [phone]              To reach medical records at any time call the [facility] operator at [phone]   To reach the covering hospitalist, page [phone]    Signed:  Ernestina Penna, MS4

## 2020-01-25 NOTE — Progress Notes (Signed)
Pt. Arrived to stress lab.  Test explained and consent obtained.   Brief history  and assessment obtained HR 80  BP 116/84  spO2  94%.  Echo images obtained.   Treadmill test started and continued w/o complications, test ended d/t dyspnea 90%MPHR achieved.  Echo images obtained and pt. Observed for 5 minutes. Vitals WNL.   Pt. Left stress lab in stable condition.   Please see Stress Report in Cardiopulmonary tab.  Hessie Knows, RN, 01/25/2020

## 2020-01-25 NOTE — Discharge Instructions (Signed)
Index English Alllanguages   Nusea e Vmito   (Nausea and Vomiting)     ________________________________________________________________________  PONTOS PRINCIPAIS   A nusea  uma sensao de mal-estar estomacal e, muitas vezes,  a sensao que temos antes de vomitar. O vmito  a expulso de alimentos e lquidos do estmago [employer].   Se voc tiver vmitos graves, seu corpo pode perder muito lquido e voc pode ficar desidratado. Voc precisa repor os lquidos perdidos.   Caso voc esteja muito desidratado, voc pode precisar receber fluidos intravenosos no hospital.  ________________________________________________________________________  Alpha Gula so nusea e vmito?  A nusea  uma sensao de mal-estar estomacal e, muitas vezes,  a sensao que temos antes de vomitar. O vmito  a expulso de alimentos e lquidos do estmago atravs da boca. s vezes voc pode sentir nusea e no apresentar vmito. O vmito pode proteger o corpo atravs da expulso de substncias nocivas.  Qual  a causa?  A nusea e o vmito so sintomas que podem ser causados por diversos motivos, como:   Gastroenterite viral, que  uma infeco causada por um vrus   Enjoo causado por movimento, como sentir-se enjoado ao andar de barco ou em um automvel   Intoxicao Research scientist (life sciences) ou outras infeces   Enxaquecas   Uso de lcool   Sentir o Neurosurgeon ou ver coisas desagradveis   Carrie Mew   Ferimento na cabea   Tobey Grim e ansiedade   Problemas no ouvido interno   Tratamento de cncer   Ataque cardaco  Tambm podem acontecer como um efeito colateral de alguns medicamentos.  Qual  o tratamento?  Se voc tiver vmitos graves, seu corpo pode perder muito lquido e voc pode ficar desidratado. Para evitar esse problema,  preciso repor os lquidos perdidos.   Descanse seu estmago ficando em jejum e bebendo somente lquidos leves. Se voc vomitar muito, o melhor  beber apenas pequenos goles, com frequncia. Beber muito  de uma nica vez pode causar mais vmito. Lquidos leves incluem:  ? gua  ? Ch fraco  ? Sucos de frutas misturados meio a meio com gua  ? Refrigerantes de cor clara e sem cafena, como o [employer] aps mexer at [employer] as bolhas desapaream, porque as bolhas podem piorar o vmito  ? Bebidas esportivas ou outras bebidas isotnicas   Evite bebidas cidas, como suco de laranja, ou com cafena, como o caf. Caso tenha diarreia, no beba leite.   Bebidas geladas podem ajudar. Chupe pedaos de gelo ou picols se voc se sentir muito enjoado para beber lquidos. Evolua de pequenos goles para quantidades maiores de lquidos claros ao longo de vrias horas. Se voc vomitar, espere uma hora e depois comece novamente com pequenos goles.   Quando voc ficar sem vomitar por vrias horas, voc pode comear a comer alimentos leves e naturais e a beber lquidos sem irritar o estmago. Boas opes so:  ? Jell-O  ? Bolachas salgadas  ? Torradas  ? Macarro comum  ? Arroz  ? Cereais cozidos  ? Batatas cozidas ou em forma de pur  ? Ovo cozido mole  ? Pur de [place]  ? Bananas   Coma pequenas quantidades devagar e evite alimentos que sejam difceis de digerir ou podem irritar o estmago, como alimentos cidos, como tomates ou laranjas, alimentos picantes ou gordurosos, carnes e vegetais crus.   Voc poder voltar  sua dieta normal em cerca de 3 dias.   Descanse o mximo possvel. Sente-se ou deite-se com a cabea  apoiada. No deite na posio horizontal por pelo menos 2 horas aps uma refeio.   No tome aspirina, ibuprofeno, ou outros anti-inflamatrios no esteroides (AINEs, NSAIDs) sem consultar o seu profissional da rea de sade. Os NSAIDs, como ibuprofeno, naproxeno, e aspirina podem causar sangramentos no estmago e outros problemas. Esses riscos aumentam com a idade. Leia o rtulo e siga as indicaes de uso. A menos que isso seja recomendado por seu profissional da rea de sade, no tome esses medicamentos por mais de 10  dias por qualquer motivo.   Entre em contato com seu profissional da rea de sade se:  ? Os sintomas piorarem.  ? Voc continuar a ter sintomas graves por mais de 1 ou 2 dias, ou voc no estiver melhorando depois de [employer].  ? Voc comear apresentar sintomas como sangue em seu vmito, diarreia sanguinolenta, ou dor de estmago severa.  Se voc tiver vmitos graves, seu corpo pode perder muito lquido e voc pode ficar desidratado. A desidratao pode ser muito perigosa, especialmente para crianas e idosos.  possvel que voc esteja perdendo tambm minerais que seu corpo precisa para continuar funcionando normalmente. Seu profissional da rea de sade pode recomendar uma reidratao oral, que  feita com uma bebida que repe os lquidos e sais minerais. Caso voc esteja muito desidratado, voc pode precisar receber fluidos intravenosos no hospital.  Voc deve consultar o seu profissional da rea de sade imediatamente se tiver:   Uma dor de cabea forte   Uma leso recente na cabea ou perda de conscincia   Confuso  Developed by [employer].  Adult Advisor 2019.[place].  ltima modificao: 2016-09-20  ltima reviso: 2016-07-26  Este contedo  revisado periodicamente e est sujeito a alteraes conforme novas informaes de sade vo sendo disponibilizadas. As informaes tm a finalidade de informar e educar e no substituem a avaliao, a orientao, o diagnstico e o tratamento mdico por um profissional de sade.  Adult Advisor 2019.4 Index     2018 [place] and/or one of its subsidiaries

## 2020-01-25 NOTE — Patient Instructions (Addendum)
You came in today for a hospital follow up and have the following issues pending:    # Type 2 NSTEMI  This is a condition involving the heart.  You were started on two medications to protect the heart.  Testing for the heart was also done today which were both normal.  You have follow up scheduled with cardiology on 6/9.  I am also referring you to nutrition as requested to discuss a heart healthy diet.    # Vertigo  This is a condition that causes you dizziness and vomiting.  You are feeling better today but we talked about how to take your medications in a way such that you have more relief moving forward.  I will be reaching out to neurology with an e-consult for advice on next steps.  I will also order an extremal neurology referral so that you may see a provider at [facility] as requested.    # H. Pylori  You were diagnosed with H. Pylori infection and started on 3 medications.  Make sure you take these medications for the full course of treatment.  I have prescribed a probiotic for you to help with stomach issues related to the antibiotic in the triple drug therapy.

## 2020-01-25 NOTE — ED Notes (Signed)
Transported to inpatient bed. Via wheelchair.

## 2020-01-25 NOTE — Consults (Signed)
CARDIOLOGY CONSULT NOTE    Requesting provider: Dr. Perlie Mayo, MD    Reason for consult: Dizziness, vomiting, elevated troponin      History of present illness:   Patient is 58 year old female  who was evaluated on the telemetry floor at [facility] where she was admitted yesterday.    Patient is a 59 year old female with known history of intermittent asthma, depression who was admitted yesterday with nausea and vomiting and extreme dizziness.  She works as a Engineer, building services.  She had an uneventful day yesterday and went to work.  She has been complaining of mild dizziness for the past few days.  1 week she came home she was experiencing significant nausea with vomiting.  Denied any chest pain.  Was admitted overnight.  This morning she feels better.    The reason for cardiology consultation is slightly elevated troponin.  She was started on intravenous heparin overnight.    At the time of my interview, she is appears completely comfortable.    Prior to Admission Medications   Prescriptions Last Dose Informant Patient Reported? Taking?   Estradiol (ESTROGEL) 0.75 MG/1.25 GM (0.06%) topical gel   No No   Sig: Place 1.25 g onto the skin every 3 (three) days.   FLUoxetine (PROZAC) 20 MG capsule   No No   Sig: TAKE 2 CAPSULES BY MOUTH EVERY DAY   Multiple Vitamin (MULTIVITAMIN) TABS   Yes No   Sig: Take 1 tablet by mouth daily.   PROAIR HFA 108 (90 Base) MCG/ACT inhaler   No No   Sig: Inhale 1 puff into the lungs every 4 (four) hours as needed for Wheezing   VITAMIN D-400 10 MCG (400 UNIT) TABS Tablet   No No   Sig: TAKE 1 TABLET BY MOUTH DAILY   clarithromycin (BIAXIN) 500 MG tablet   No No   Sig: Take 1 tablet by mouth 2 (two) times daily  for 14 days   estradiol (ESTRING) 2 MG vaginal ring   No No   Sig: PLACE 2 MILLIGRAM VAGINALLY EVERY 3 MONTHS   fluticasone (FLONASE) 50 MCG/ACT nasal spray   No No   Sig: 2 sprays by Each Nostril route daily   meclizine (ANTIVERT) 25 MG TABS   No No   Sig: Take 1 tablet  by mouth every 8 (eight) hours as needed  for up to 10 days   metroNIDAZOLE (FLAGYL) 500 MG tablet   No No   Sig: Take 1 tablet by mouth 3 (three) times daily Avoid use with alcohol.  for 14 days   naproxen (NAPROSYN) 375 MG tablet   No No   Sig: TAKE 1 TABLET BY MOUTH TWICE DAILY WITH MEALS FOR 14 DAYS   omega-3 acid ethyl esters (LOVAZA) 1 GM capsule   Yes No   Sig: Take 2 g by mouth 2 (two) times daily.   omeprazole (PRILOSEC) 20 MG capsule   No No   Sig: Take 1 capsule by mouth daily  for 14 days   ondansetron (ZOFRAN) 4 MG tablet   No No   Sig: Take 1 tablet by mouth every 8 (eight) hours as needed for Nausea  for up to 10 days      Facility-Administered Medications: None         Review of Patient's Allergies indicates:   Hydrocodone-acetami*    Nausea and Vomiting   Pcn [penicillins]           Comment:Got a urine infection  after using    Patient Active Problem List:     Nonspecific reaction to tuberculin skin test without active tuberculosis     Major depressive disorder, recurrent, in remission ([place])     Mammographic microcalcification, normal BX     PVD (posterior vitreous detachment)     Asymptomatic neurocysticercosis     Female stress incontinence     Family history of diabetes mellitus type II     Seasonal allergic rhinitis     BMI 26.0-26.9,adult     Varicose veins of lower extremity     Subclinical hypothyroidism      SOCIAL & FAMILY HISTORY: Former smoker.  Quit 2005.  Social alcohol.  No drug use.  No family still premature coronary artery disease or sudden cardiac death.    ROS: All other systems reviewed were pertinently negative apart from the history of present illness.    OBJECTIVE:  PHYSICAL EXAM:  General: Well built   01/25/20  0200 01/25/20  0300 01/25/20  0400 01/25/20  0424   BP: 123/83 108/72 115/75 136/82   Pulse: 98 92 93 90   Resp: 17 16 16 18    Temp:   98.1 F (36.7 C) 99 F (37.2 C)   TempSrc:   Temporal Oral   SpO2:  97% 97% 96%   Weight:    66.1 kg (145 lb 11.6 oz)     HEENT:  Oral mucosa is moist, no icterus, no pallor noted.  NECK: Supple, no jugular venous distention, no carotid bruits, no lymphadenopathy.  CHEST: Clear to auscultation, no crackles or rhonchi.  HEART: Apical impulse is not palpable. Heart sounds are normal. Regular rate and rhythm. No murmur, gallops, or rubs heard.  ABDOMEN: Soft, no organomegaly detected.  EXTREMITIES: Warm and well perfused. Bilateral lower limb pulses are palpable. There is no evidence of peripheral pedal edema.    NEURO: A&O X 3, no obvious motor or sensory deficits  PSYCHIATRIC: Mood and affect are appropriate.  SKIN: No rash observed    PERTINENT INVESTIGATIONS:  1. EKG: (on my personal review) EKG from yesterday completely normal.  2. LABS:   Lab Results   Component Value Date    NA 136 01/24/2020    K 4.2 01/24/2020    CL 101 01/24/2020    CO2 29 01/24/2020    BUN 14 01/24/2020    CREAT 0.7 01/24/2020    GLUCOSER 171 (H) 01/24/2020     Lab Results   Component Value Date    LDL 174 01/25/2020    HDL 60 01/25/2020    TG 60 01/25/2020     TROPONIN I (ng/mL)   Date Value   01/25/2020 0.22 (H)   01/24/2020 0.10 (H)   05/13/2009 0.03       3. ECHO: Performed today-I have reviewed it personally.  LVEF 60%.  No wall motion abnormalities.    4. STRESS TEST: Achieved 8 METS.  Nonspecific ST-T segment changes with exercise.  No wall motion abnormalities  noted.    ASSESSMENT: 59 year old female with nausea and vomiting with dizziness.  Minimal troponin elevation-unclear reasons.  CAD work-up is negative.  Baseline echocardiogram is normal.  Stress echocardiogram is normal.  No evidence of ischemia.  She has hyper lipidemia.    PLAN:  #1: Difficult to explain the reason for troponin.  Clearly a type II event in relationship to the event of extreme nausea/vomiting with dizziness.  No clear evidence of type I ACS.  No indication for cardiac  catheterization.    #2: Would recommend to continue atorvastatin 40 mg for hyperlipidemia.    #3: Discontinue  aspirin    #4: Patient should be followed up in cardiology in a few weeks.    Thank you for the privilege of involving me in this patient's care.  Please feel free to contact me if you have any questions.  Total care time is 80 minutes outside of separately billable procedures of which more than 50% was spent in counseling and/or coordination of care regarding above issues and diagnosis . Time was spent reviewing the complexity of the case, bedside evaluations of patient's clinical status, interpreting EKG, lab work, imaging studies, discussing with nursing staff and attending physician.   This patient encounter note was created using voice-recognition software and in real time. Please excuse any typographical errors that have not been edited out.     Electronically signed by: Teodoro Kil, MD, 01/25/2020 9:36 AM

## 2020-01-25 NOTE — H&P (Signed)
H&P NOTE     Chief Complaint:   Patient presents with:  Nausea: Dizziness  Vomiting      History of Present Illness:  This is a 59 yo F PMHx including mild intermittent asthma, atrophic vaginitis, major depressive d/o, recently diagnosed with H. Pylori infection about 2 days ago, who presents to [facility] ED on 01/24/2020 c/o nausea/vomiting and dizziness at home.  When it comes on, it is severe; she describes it as the room spinning around her, lasts for minutes to hours, a/w nausea and vomiting. Appears to be provoked by turning her head to the left. No apparent a/w tinnitus or recent URI or other infection, or hearing changes. The patient states that she has had a long-standing history of dizziness, which is corroborated by review of medical record. This is not the worst paroxysm of symptoms she's experienced in the past, but commensurate with her prior episodes. She appears to have followed with her PMD for management and previously received meclizine for a Meniere disease diagnosis, but has not undergone any more formal evaluation. She states that she came to the ED today because she had vomiting that was unremitting at home; last vomiting was earlier in the evening, on presentation to the department, prior to any intervention.     Review of medical record further shows that she has pursued workup for the intermittent but persistent nausea over the last year or so and underwent stool testing for H. Pylori, which just resulted as positive. She was started on triple antibiotics yesterday, which seems to be associated with recrudescence of symptoms.     In ED, VS: BP 150/104 HR 94 RR 16 SpO2 96% ORA T 31F. She received meclizine for c/o dizziness and her symptoms did abate. Screening labs were significant for troponin elevated to 0.10. The patient had no chest pain throughout her ED course. She had 2 EKG with NSR and no acute ST-T wave changes, new conduction delay, or other signs of ischemia.     At the time of my  interview and exam the patient reported feeling well and much improved, but remained mildly anxious about the cause of her symptoms. I did explain her projected hospital course in detail. The patient has no personal or family history of heart disease. She is a former tobacco user for 6 months and she quit >10 yrs ago. She has no diabetes, or hypertension; an A1C earlier this month shows she does not have diabetes. She has a low HEART score but given age and presentation including abdominal pain and positive troponin she will be observed overnight for serial trops and likely outpatient cardiology referral.     At this time as well I have a low suspicion for central cause of vertigo, appears to be consistent with peripheral, if no relief with meclizine can try Valium, but given severity of symptoms and how long she's had them for, may benefit from neuro/ENT referral and possible w/u including head CT or MRI for further evaluation of posterior fossa. At this time the patient no longer had symptoms and so a HINTS exam was not performed. At the time of my interview the pt did not c/o headache or ever notice a headache a/w her symptoms, and she has no unilateral facial deficits to suggest vertebral artery dissection. She has no cerebellar signs on my exam including no dysdadochokinesia and I have a lower suspicion for posterior cerebellar syndromes.     Otherwise she received 1 dose of covid-19 vaccine on  04/03 and states she is to receive the second dose on 04/24.       Review of Systems:  10 systems reviewed and otherwise negative except as above per HPI.     Patient Active Problem List:  Patient Active Problem List:     Nonspecific reaction to tuberculin skin test without active tuberculosis     Major depressive disorder, recurrent, in remission ([place])     Mammographic microcalcification, normal BX     PVD (posterior vitreous detachment)     Asymptomatic neurocysticercosis     Female stress incontinence     Family  history of diabetes mellitus type II     Seasonal allergic rhinitis     BMI 26.0-26.9,adult     Varicose veins of lower extremity     Subclinical hypothyroidism      Past Medical History:   Past Medical History:  No date: Asthma  08/31/2004: Depressive disorder, not elsewhere classified      Comment:  10/00 to Psych on Paxil, self d/c'd; 9/00 head CT:                scattered punctate calcifications  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      Comment:  treated [facility] 11/97 CSF VDRL neg 9/00 RPR 9/00 1:2 repeat                RPR yearly   08/31/2004: Nonspecific abnormal results of thyroid function study      Comment:  increased TSH 4.19 6/00; TSH nl 9/00; 05/04/99 - wnl  04/23/2009: Presbyopia  04/23/2009: PVD (posterior vitreous detachment)  08/31/2004: Trichomoniasis, unspecified      Comment:  10/97  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      Comment:  +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 - had negative CXR pp at [facility] referred to TB               clinic  08/31/2004: Urinary tract infection, site not specified      Comment:  4/02, 6/02    Past Surgical History:   Past Surgical History:  No date: BREAST BIOPSY & NEEDLE LOC WIR  No date: EXCISION EXCESSIVE SKIN & SUBQ TISSUE ABDOMEN      Comment:  abdominoplasty in [place]  1998: LIG/TRNSXJ FLP TUBE ABDL/VAG APPR UNI/BI      Comment:  Tubal Ligation  No date: OB ANTEPARTUM CARE CESAREAN DLVR & POSTPARTUM      Comment:  c/section x 2  2004: REDUCTION MAMMAPLASTY  No date: TRURL RF FEMALE BLADDER NECK STRS URIN INCONT    Medications prior to Admission:   (Not in a hospital admission)      Allergies:   Review of Patient's Allergies indicates:   Hydrocodone-acetami*    Nausea and Vomiting   Pcn [penicillins]           Comment:Got a urine infection after using    Social History:  Tobacco Use: Social History    Tobacco Use      Smoking status: Former Smoker        Start date: 10/05/2003        Quit date: 08/10/2004        Years since quitting: 15.4      Smokeless tobacco: Former Systems developer      Tobacco comment: smoked  only for 6 months.    Alcohol:   Alcohol use Yes   Comment: minimal       Family History: Review of patient's family history indicates:  Problem: Cancer - Other  Relation: Father          Age of Onset: (Not Specified)          Comment: lung & skin cancer  Problem: Diabetes      Relation: Mother          Age of Onset: (Not Specified)  Problem: Hypertension      Relation: Mother          Age of Onset: (Not Specified)      Physical Exam:  General: AOx3, NAD  Head: atraumatic  ENT: PERRLA, EOMI, sclerae anicteric, moist mucous membranes, no rhinorrhea  Neck: Supple, no JVD  Cardiovascular: S1S2 only no m/r/g, peripheral pulses intact  Respiratory: bronchovesicular sounds only no wheezes/rales/rhonchi  GI: abdomen soft, nontender, nondistended  MSK: no peripheral edema, no trauma  Neuro: CN II-XII grossly intact, moves all extremities freely and to command  Psych: alert, pleasant, cooperative      Intake/Output last 24hours (7a-7a):   I/O 24 Hrs:  In: 1000   Out: -     Vital Signs - Last 24 Hours:   BP: (108-150)/(72-104)   Temp:  [98 F (36.7 C)]   Pulse:  [88-99]   Resp:  [15-21]   SpO2:  [96 %-97 %]     Vital Signs - Last 8 Hours:   BP: (108-150)/(72-104)   Temp:  [98 F (36.7 C)]   Pulse:  [88-99]   Resp:  [15-21]   SpO2:  [96 %-97 %]     Recent Labs:  CBC:   Lab Results   Component Value Date    WBC 10.8 01/24/2020    HGB 13.5 01/24/2020    HCT 40.5 01/24/2020    PLTA 224 01/24/2020    RBC 4.32 01/24/2020     BMP: Lab Results   Component Value Date    NA 136 01/24/2020    K 4.2 01/24/2020    CL 101 01/24/2020    CO2 29 01/24/2020    BUN 14 01/24/2020    CREAT 0.7 01/24/2020    GLUCOSER 171 (H) 01/24/2020    CA 8.9 01/24/2020     LFTs:   Lab Results   Component Value Date    AST 28 01/24/2020    ALT 32 01/24/2020    TBILI 0.4 01/24/2020    ALKPHOS 86 01/24/2020       Lab Results   Component Value Date    PT 10.0 05/12/2009    INR 1.0 (L) 05/12/2009    APTT 24.5 05/12/2009     Lab Results   Component Value Date     CKMBI 0.8 05/13/2009    TROPI 0.10 (H) 01/24/2020     No results found for: BNP      Micro:  covid-19 negative     Imaging:   N/A     Assessment/Plan:  A 59 yo lady with a history of intermittent nausea/vomiting and abdominal found recently found to be H. Pylori positive and started on triple antibiotics who presents with worsening GI symptoms today, incidentally found to have an elevated troponin, now for ACS r/o.     #ACS r/o  -pt p/w nausea/vomiting and abdominal pain  -incidentally with elevated trop x 1 here in the setting of normal renal function   -EKG is non-ischemic and no signs of electrical instability   -she has few risk factors and has been chest pain free   -intermiediate HEART score 3, but with positive troponin, and pt agreeable to stay for  evaluation  -TIMI score 1, 5% risk at 14d of all-cause mortality; GRACE score 81   -trend trops x 2 more, if significant elevation will start heparin drip and further evaluation by cardiology  -likely outpatient f/u     #dizziness  -pt describes an intermittent but well-known to her history of dizziness  -at this time pt without symptoms, improved after meclizine in ED  -likely BPPV , symptoms appear to be provoked or worsened by turning head to the left  -no a/w tinnitus or hearing loss and no recent URI  Or other infection  -is a/w nausea/vomiting  -HINTS exam not performed as pt no longer actively symptomatic  -no cerebellar signs on exam  -likely peripheral, BPPV, can treat with meclizine or consider valium if no improvement  -outpatient neuro/ENT f/u   -consider CT head or MRI if symptoms worsen or exam changes and there is a more consideration for posterior circulation/posterior fossa     #H. Pylori infection  -pt underwent stool testing for H. Pylori recently at her PMD which was positive  -currently on day 2 of triple antibiotic therapy  -this likely represents a trigger for the nausea/vomiting pt experienced earlier today prior to presentation  -will  c/w triple abx here clarithromycin, flagyl, and protonix   -outpatient f/u     #h/o major depressive d/o  -euthymic here, c/w home fluoxetine     #h/o atrophic vaginitis  -pt is well established with outpatient GYN    #DVT ppx  -heparin     #GI ppx  -on Protonix IV for H. Pylori gastritis     Dispo: from ED to med/surg tele for likely less than 2 midnights for ACS r/o and need for serial troponin and monitoring for development of chest pain, likely disposition to home later today           Birdie Riddle, MD  Pager Number: [phone]

## 2020-01-25 NOTE — Progress Notes (Signed)
Acute Care Clinic Note    Subjective  Loretta Martin 59 year old English-speaking female who presents for evaluation in acute care clinic.    HPI    Hospital follow up, patient was discharged this morning. Pending issues are:    # Type 2 NSTEMI  Patient presented with nausea, vomiting, and dizziness yesterday to ED.  No chest pain. Normal EKG with NSR, no signs of ischemia.  Troponins down trending as of this morning. Discharged to [facility] for follow up.  - Added atorvastatin and metoprolol  - Had echo and stress today, both wnl  - Cardiology f/u on 6/9    # Vertigo  History of dizziness with nausea  This episode not as bad as those in the past but nausea was more pronounced this time  Today, dizziness is not as bad  Usually positional, with movement, and feels like the room is spinning - but not today  Has meclizine and zofran at home which she takes intermittently  Concerned that there was no final diagnosis or workup when in ED    # H. Pylori Infection  Diagnosed recently and started on triple drug therapy  Notes some GI discomfort since starting antibiotics    Per chart review prior to visit:  Review of Patient's Allergies indicates:   Hydrocodone-acetami*    Nausea and Vomiting   Pcn [penicillins]           Comment:Got a urine infection after using    Objective  BP 96/60    Pulse 89    Temp 98.3 F (36.8 C)    Wt 65.7 kg (144 lb 13.5 oz)    LMP 11/02/2009    SpO2 94%    BMI 26.65 kg/m      Most Recent O2 Sat Reading(s)  01/25/20 : 94%  01/25/20 : 95%  01/16/20 : 95%    Gen: well appearing in no acute respiratory distress  HEENT:  Nares: patent  CV: regular rate and rhythm  Pulm: CTAB, no rhonchi, no wheezing, no bibasilar rales  Skin: good skin turgor, not diaphoretic  Neuro: alert and oriented to time, person and place, [employer] negative  Psych: normal affect. Normal thought content, speech, mood are noted.    Assessment & Plan    1. Dizziness  2. Non-intractable vomiting with nausea, unspecified  vomiting type  59 yo F with nausea/vomiting and dizziness, admitted to [facility] overnight yesterday found to have Type 2 NSTEMI and vertigo. Today she feeling better but still vomiting after eating, at times vomiting medications as well. Discussed how to time and when to take Zofran and meclizine with goal of avoiding vomiting and reducing dizziness. Likely peripheral etiology to vertigo given positional and movement trigger and spinning room sensation. She notes that she has never had any formal work up and given severity of symptoms and how long she has had them, she would benefit from neuro e-consult for next steps. No symptoms today.  PLAN:  - Will e-consult neurology regarding vertigo and further work up   - Question: imaging vs neuro visit vs ENT vs other?  - For nausea: increase Zofran up to TID before meals prn for nausea  - For dizziness: take meclizine up to TID prn for dizziness  - Discussed maintaining hydration    3. History of non-ST elevation myocardial infarction (NSTEMI)  Patient with Type 2 NSTEMI upon ED admission, now with down trending troponins. Cardiology imaging started with normal appearing stress and echo  done today. Has cardiology follow up in place. Discussed with patient importance of taking atorvastatin and metoprolol as prescribed. Discussed heart healthy diet and patient would like to discuss this further with nutritionist. Will refer to [facility] provider.  PLAN:   - Continue atorvastatin and metoprolol  - Cardiology f/u on 6/9  - Nutrition f/u on 4/30 with PA Lemons regarding heart healthy diet    4. Helicobacter pylori infection  Patient recently diagnosed with infection. Started on triple drug therapy with omeprazole, clarithromycin, and flagyl. Patient concerned with GI upset due to antibiotic use. Recommend probiotic.  PLAN:  - Continue triple drug therapy as prescribed to complete course  - Start saccharomyces boulardii (FLORASTOR) 250 MG capsule; Take 1 capsule by mouth 2  (two) times daily  for 14 days    Disposition  Home: patient has f/u's in place.  - Precautions given, including to call immediately or if new or worsening shortness of breath, and when to seek emergency care or call 911.    Loretta Macke, PA-C    I spent a total of 45 minutes on this visit on the date of service (total time includes all activities performed on the date of service)    CC to PCP: Berdine Addison, MD

## 2020-01-26 ENCOUNTER — Encounter (HOSPITAL_BASED_OUTPATIENT_CLINIC_OR_DEPARTMENT_OTHER): Payer: Self-pay | Admitting: Family Medicine

## 2020-01-26 DIAGNOSIS — Z23 Encounter for immunization: Secondary | ICD-10-CM | POA: Diagnosis not present

## 2020-01-26 NOTE — Progress Notes (Signed)
Case discussed with PA Anupa Manjunatha. Agree with assessment and plan.     Laymond Purser, PA-C

## 2020-01-27 LAB — EKG

## 2020-01-28 ENCOUNTER — Other Ambulatory Visit (HOSPITAL_BASED_OUTPATIENT_CLINIC_OR_DEPARTMENT_OTHER): Payer: Self-pay

## 2020-01-28 DIAGNOSIS — R42 Dizziness and giddiness: Secondary | ICD-10-CM

## 2020-01-28 NOTE — e-consult (Signed)
Neurology e-Consultation    Thank you for referring your patient for an e-consultation. I have not interviewed or examined the patient. My recommendations here are based on the review of relevant clinical information and information provided by the referring provider.    Consultation request:   59 yo F with nausea/vomiting and dizziness, admitted to [facility] overnight 4/22-4/23, found to have Type 2 NSTEMI and vertigo. She has a longstanding history of dizziness with no formal prior workup. Sounds like peripheral etiology to vertigo given positional and movement trigger and spinning room sensation. Also associated with nausea/vomiting. She had a CT head done in 2011 showing "scattered calcificationsbilaterally raising the question of chronic cysticercosis". She has meclizine and zofran at home. Unclear if they are helping much, counseled her on how to use them more effectively today.     Questions:   - Is repeat imaging needed?   - Should this patient see ENT?   - Are the prior CT findings relevant to the dizziness she is experiencing?      Case Summary:   Reviewed discharge summary. Pt presented to ED 01/24/20 with N/V and room spinning dizziness. Vertigo lasts minutes to hours, a/w n/v, provoked by turning head to the left. No hearing changes. Current vertigo consistent with prior episodes of similar dizziness. Had Meniere disease diagnosis in the past. Pt did not have symptoms when examined so no HINTS exam performed.    Chart reviewed. Vertigo reported since before 2006, infrequent, associated with n/v. Saw [name] 06/26/18 with two weeks of brief spinning if she moved quickly. 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     Assessment:   59 year old woman with occasional vertigo for > 15 years, triggered by head movement. 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 She did not report additional symptoms with her recent vertigo to suggest posterior circulation ischemia so TIA or other central cause is unlikely and I don't think urgent imaging is needed. She should be evaluated non-urgently by either ENT or neurology to try to make a more specific diagnosis.    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    Recommendations:  1. Routine referral to either ENT or neurology  2. No indication for urgent imaging.      Please let me know whether I can be of further assistance and please let me know how the patient responds to your management.    Sincerely yours,    Kara Mead, MD    Patient Disposition : REGULAR FOLLOW UP VISIT NEEDED    Time spent on this e-Consultation :  11-20 minutes, >50% of the total time devoted to medical consultative verbal/ EMR discussion between the providers written and verbal report will be generated

## 2020-01-30 ENCOUNTER — Encounter (HOSPITAL_BASED_OUTPATIENT_CLINIC_OR_DEPARTMENT_OTHER): Payer: Self-pay | Admitting: Physician Assistant

## 2020-02-01 ENCOUNTER — Encounter (HOSPITAL_BASED_OUTPATIENT_CLINIC_OR_DEPARTMENT_OTHER): Payer: Self-pay | Admitting: Internal Medicine

## 2020-02-01 ENCOUNTER — Ambulatory Visit: Payer: 59 | Attending: Family Medicine | Admitting: Physician Assistant

## 2020-02-01 ENCOUNTER — Telehealth (HOSPITAL_BASED_OUTPATIENT_CLINIC_OR_DEPARTMENT_OTHER): Payer: Self-pay | Admitting: Internal Medicine

## 2020-02-01 VITALS — BP 102/74 | HR 81 | Temp 98.1°F | Wt 147.7 lb

## 2020-02-01 DIAGNOSIS — K5900 Constipation, unspecified: Secondary | ICD-10-CM | POA: Insufficient documentation

## 2020-02-01 DIAGNOSIS — I252 Old myocardial infarction: Secondary | ICD-10-CM | POA: Diagnosis present

## 2020-02-01 NOTE — Progress Notes (Signed)
CC: nutrition counseling  HPI: 20yoF with pmhx subclinical hypothyroidism, MDD who presents to discuss the following concerns.  Language of care: English    #constipation  - a little over a month ago  - began before vertigo episode   - BM once per day when "normal"  - right now, "going a lot" - notes increase in supplemental fiber use  - denies melena, hematochezia   - was taking naproxen as well for hand pain, no longer taking   - no other medication changes  - denies fevers, chills, nausea, vomiting  - doesn't recall any specific food triggers except for white rice causing constipation    24hr diet recall:   - breakfast: 8am, whole grain toast with pb or oatmeal   - lunch: snack on peanuts   - dinner: 5-6pm - rice, beans, vegetables, meat   - challenges: can't sit down to eat, will eat in between driving    Review of Systems   General: denies fevers, chills, unintentional weight loss  o HEENT: denies headache, dizziness   CV: denies chest pain, palpitations, dyspnea on exertion, leg swelling   Respiratory: denies shortness of breath, cough   GI: denies abdominal pain, nausea, vomiting, diarrhea, melena, hematochezia, +intermittent constipation   GU: denies dysuria, hematuria, urgency, frequency   MSK: denies joint pain, arthralgias / myalgias   Neurologic:denies syncope, decreased sensation, focal weakness, numbness, tingling   Mental status: denies anxiety, depression, memory deficits, mood changes, SI/HI, AVH    Repeat Blood Pressure:  BP Pulse Site Cuff Size Time Date   102/74 81 --- ---  4:03 PM 02/01/2020   No orthostatic vitals data filed.  No peak flow data filed.  No pain information filed.    Physical Exam   General: awake, alert, oriented, in NAD, converses easily   Skin: no rashes, masses   HEENT:  o Head: normocephalic, atraumatic  o Eyes: PERRLA, EOMI, no icterus or conjunctival injection   Neck: no LAD, ROM wnl   Neurologic: cranial nerves 2-12 grossly intact, alert and oriented, no  weakness or tremors   MSK: gait normal, walks without difficulty, no joint swelling, deformity or tenderness    Assessment and Plan  (I25.2) History of non-ST elevation myocardial infarction (NSTEMI) (primary encounter diagnosis)  Comment: Reviewed nutrition recommendations in the context of pre-existing cardiac disease. Discussed ample vegetables, fruits, moderate protein, heart-healthy omega-3 fatty acids. Advised caution against refined carbohydrates, fast/processed foods, omega-6 fatty acids. Also educated on ample sleep, optimal movement, and daily stress management.  Plan: f/u prn    (K59.00) Constipation, unspecified constipation type  Comment: 24 diet recall reveals inadequate fiber intake. Reviewed food sources of fiber to add to nutrition choices.  Plan: monitor        1. Return to clinic if symptoms don't improve or worsen. Discussed when to return to office vs seek emergent care.  2. The patient and/or guardian indicates understanding of these issues and agrees with the plan. No apparent learning barriers were identified. I attempted to answer any questions regarding the diagnosis and the proposed treatment.  3.  The patient is given an After Visit Summary sheet that lists all of their medications with directions, their allergies, orders placed during this encounter, and follow- up instructions.  4. I reviewed the patient's medical information, allergies and medications. I reviewed the potential side effects of any new medications prescribed and reconciled the current medication list, supplying refills if needed.    Delora Fuel, PA-C  I spent a total of 30 minutes on this visit on the date of service (total time includes all activities performed on the date of service): preparing to see the patient and chart review, obtaining updated history, performing medically appropriate physical examination, ordering medications, counseling and education the patient, communicating with other health care  professionals, documenting clinical information in the health record, and care coordination.

## 2020-02-01 NOTE — Patient Instructions (Addendum)
Healthy living:  DIET:   Eat a modified Mediterranean: LOTS of non-starchy vegetables (cauliflower, broccoli, onions, zucchini, tomatoes, kale, spinach, lettuce, for example), lots of healthy omega-3 fatty acids (salmon, avocado, nuts, seeds), and moderate amounts of protein at every meal. Aim for small amounts of carbohydrates, and very, very small amounts of sugar, processed/fast foods, trans fats, and refined carbohydrates. When choosing carbohydrates, aim for ones that have high fiber (sweet potatoes, brown rice, quinoa, oats, etc) and pair with protein and healthy fat.   Instead of using salt, try garlic powder and other herbs/spices.    Eat a well balanced meal (see above) every 4-6 hours. Try eating at similar times every day.   Keep a period of 12 hours of not eating between your last meal (dinner) and next meal (breakfast). If you can, extend this fast up to 16 hours. Time restricted feeding can be really helpful with fatigue.   Apply mindful eating at each meal, eating slowly and paying attention to hunger cues.    LIFESTYLE:   Focus on getting enough restful sleep, ~7-9 hours per night.   Exercise moderately 3-5 times per week.   Try to go for a walk a few times throughout the day. Walking for 20 minutes after meals can help prevent spikes in blood sugar after eating.      - clean fifteen, dirty dozen for organic   - dairy/meat: grass fed   - chicken/eggs: pasture raised

## 2020-02-03 ENCOUNTER — Other Ambulatory Visit (HOSPITAL_BASED_OUTPATIENT_CLINIC_OR_DEPARTMENT_OTHER): Payer: Self-pay | Admitting: Physician Assistant

## 2020-02-03 DIAGNOSIS — A048 Other specified bacterial intestinal infections: Secondary | ICD-10-CM

## 2020-02-03 NOTE — Telephone Encounter (Signed)
PER Pharmacy, Loretta Martin is a 59 year old female has requested a refill of     -  OMEPRAZOLE       Last Office Visit: 02/01/2020 with LEMONS, K  Last Physical Exam: 12/11/2018    COLONOSCOPY due on 07/31/2019  PAP SMEAR due on 01/27/2020  HPV SCREENING due on 01/27/2020    Other Med Adult:  Most Recent BP Reading(s)  02/01/20 : 102/74        Cholesterol (mg/dL)   Date Value   01/25/2020 255 (*H)     LOW DENSITY LIPOPROTEIN DIRECT (mg/dL)   Date Value   01/25/2020 174     HIGH DENSITY LIPOPROTEIN (mg/dL)   Date Value   01/25/2020 60     TRIGLYCERIDES (mg/dL)   Date Value   01/25/2020 60         THYROID SCREEN TSH REFLEX FT4 (uIU/mL)   Date Value   01/16/2020 4.310 (H)         TSH (THYROID STIM HORMONE) (uIU/mL)   Date Value   05/26/2016 8.300 (H)       HEMOGLOBIN A1C (%)   Date Value   01/16/2020 5.3       No results found for: POCA1C      INR (no units)   Date Value   01/25/2020 1.1   05/12/2009 1.0 (L)       SODIUM (mmol/L)   Date Value   01/24/2020 136       POTASSIUM (mmol/L)   Date Value   01/24/2020 4.2           CREATININE (mg/dL)   Date Value   01/24/2020 0.7       Documented patient preferred pharmacies:    [facility] - [place], [place] - [address] AT [address]  Phone: [phone] Fax: [phone]

## 2020-02-12 ENCOUNTER — Other Ambulatory Visit (HOSPITAL_BASED_OUTPATIENT_CLINIC_OR_DEPARTMENT_OTHER): Payer: Self-pay | Admitting: Internal Medicine

## 2020-02-12 ENCOUNTER — Ambulatory Visit: Payer: 59 | Attending: Internal Medicine | Admitting: Internal Medicine

## 2020-02-12 DIAGNOSIS — A048 Other specified bacterial intestinal infections: Secondary | ICD-10-CM | POA: Diagnosis present

## 2020-02-12 DIAGNOSIS — I252 Old myocardial infarction: Secondary | ICD-10-CM

## 2020-02-12 DIAGNOSIS — R42 Dizziness and giddiness: Secondary | ICD-10-CM

## 2020-02-12 MED ORDER — ASPIRIN EC 81 MG PO TBEC
81.0000 mg | DELAYED_RELEASE_TABLET | Freq: Every day | ORAL | 0 refills | Status: DC
Start: 2020-02-12 — End: 2020-06-08

## 2020-02-12 NOTE — Progress Notes (Signed)
PRIMARY CARE TELEVISIT NOTE:    HISTORY:     Loretta Martin is a 59 year old female who presents for ongoing vertigo.    Recently visited urgent care (01/15/20) and emergency department (01/24/20) for dizziness, nausea, and vomiting that is worse with movement. She notes that any movement could trigger this and it is worse with faster movements (has been moving slowly). No head imaging or HINTS exam performed in ED.    Now, she states that her head still feels a little weak and dizzy. She started Zofran for nausea and dizziness, which she feels has been helping her symptoms. She is scheduled to see a neurologist at [facility] on June 28th, 2021.    In the ED, she was noted to have a type 2 NSTEMI with elevated screening troponins (0.1 -> 0.22 -> 0.19) and was started on metoprolol 25mg  and atorvastatin 20mg . She has an appointment with a cardiologist on June 9th, 2021.    She denies chest pain or ringing in her ears.    History:    Past medical history, social history, and family history reviewed and updated in EMR. Notable for:    Medications: See below.    Allergies: Reviewed and updated in EMR.    ASSESSMENT AND PLAN:    Loretta Martin is a 59 year old female who presents with the following ongoing symptoms of vertigo.    #Vertigo  Notes vertigo that worsens with fast movements that has been ongoing for months now. Plan for neurology visit in June, but will continue work-up and treatment in the interim.  Dx:  - MRI  Tx:  - Physical therapy (advanced Epley maneuvers)    #Type 2 NSTEMI  Noted to have a Type 2 NSTEMI in the ED on 01/24/2020. Scheduled for follow-up with cardiologist on 03/12/2020.  Tx:  - metoprolol 25mg   - atorvastatin 20mg   - start aspirin 81mg   - cardiology appointment 03/12/2020    Dispo: Referral to specialist: neurologist, cardiologist    Medications at the end of encounter:      Current Outpatient Medications:     aspirin EC 81 MG EC tablet, Take 1 tablet by mouth daily, Disp: 90  tablet, Rfl: 0    omeprazole (PRILOSEC) 20 MG capsule, TAKE 1 CAPSULE BY MOUTH DAILY FOR 14 DAYS, Disp: 14 capsule, Rfl: 0    atorvastatin (LIPITOR) 20 MG tablet, Take 1 tablet by mouth daily, Disp: 30 tablet, Rfl: 0    metoprolol (TOPROL-XL) 25 MG 24 hr tablet, Take 1 tablet by mouth daily, Disp: 30 tablet, Rfl: 0    ondansetron (ZOFRAN) 4 MG tablet, Take 1 tablet by mouth every 8 (eight) hours as needed for Nausea, Disp: 30 tablet, Rfl: 2    PROAIR HFA 108 (90 Base) MCG/ACT inhaler, Inhale 1 puff into the lungs every 4 (four) hours as needed for Wheezing, Disp: 3 Inhaler, Rfl: 0    estradiol (ESTRING) 2 MG vaginal ring, PLACE 2 MILLIGRAM VAGINALLY EVERY 3 MONTHS, Disp: 1 each, Rfl: 3    VITAMIN D-400 10 MCG (400 UNIT) TABS Tablet, TAKE 1 TABLET BY MOUTH DAILY, Disp: 30 tablet, Rfl: 11    FLUoxetine (PROZAC) 20 MG capsule, TAKE 2 CAPSULES BY MOUTH EVERY DAY, Disp: 180 capsule, Rfl: 3    fluticasone (FLONASE) 50 MCG/ACT nasal spray, 2 sprays by Each Nostril route daily, Disp: 1 Bottle, Rfl: 11    Multiple Vitamin (MULTIVITAMIN) TABS, Take 1 tablet by mouth daily., Disp: ,  Rfl:     omega-3 acid ethyl esters (LOVAZA) 1 GM capsule, Take 2 g by mouth 2 (two) times daily., Disp: , Rfl:

## 2020-02-13 NOTE — Telephone Encounter (Signed)
PER Pharmacy, Loretta Martin Loretta Martin is a 59 year old female has requested a refill of fluoxetine.      Last Office Visit: 02/12/2020 with pcp  Last Physical Exam: 12/11/2018    COLONOSCOPY due on 07/31/2019  PAP SMEAR due on 01/27/2020  HPV SCREENING due on 01/27/2020    Other Med Adult:  Most Recent BP Reading(s)  02/01/20 : 102/74        Cholesterol (mg/dL)   Date Value   01/25/2020 255 (*H)     LOW DENSITY LIPOPROTEIN DIRECT (mg/dL)   Date Value   01/25/2020 174     HIGH DENSITY LIPOPROTEIN (mg/dL)   Date Value   01/25/2020 60     TRIGLYCERIDES (mg/dL)   Date Value   01/25/2020 60         THYROID SCREEN TSH REFLEX FT4 (uIU/mL)   Date Value   01/16/2020 4.310 (H)         TSH (THYROID STIM HORMONE) (uIU/mL)   Date Value   05/26/2016 8.300 (H)       HEMOGLOBIN A1C (%)   Date Value   01/16/2020 5.3       No results found for: POCA1C      INR (no units)   Date Value   01/25/2020 1.1   05/12/2009 1.0 (L)       SODIUM (mmol/L)   Date Value   01/24/2020 136       POTASSIUM (mmol/L)   Date Value   01/24/2020 4.2           CREATININE (mg/dL)   Date Value   01/24/2020 0.7       Documented patient preferred pharmacies:    [facility] - [place], [place] - [address] AT [address]  Phone: [phone] Fax: [phone]

## 2020-02-19 ENCOUNTER — Other Ambulatory Visit: Payer: Self-pay

## 2020-03-12 ENCOUNTER — Ambulatory Visit: Payer: 59 | Attending: Internal Medicine | Admitting: Internal Medicine

## 2020-03-12 ENCOUNTER — Other Ambulatory Visit (HOSPITAL_BASED_OUTPATIENT_CLINIC_OR_DEPARTMENT_OTHER): Payer: Self-pay | Admitting: Internal Medicine

## 2020-03-12 ENCOUNTER — Encounter (HOSPITAL_BASED_OUTPATIENT_CLINIC_OR_DEPARTMENT_OTHER): Payer: Self-pay | Admitting: Internal Medicine

## 2020-03-12 DIAGNOSIS — I252 Old myocardial infarction: Secondary | ICD-10-CM | POA: Insufficient documentation

## 2020-03-12 DIAGNOSIS — E785 Hyperlipidemia, unspecified: Secondary | ICD-10-CM | POA: Insufficient documentation

## 2020-03-12 MED ORDER — ATORVASTATIN CALCIUM 40 MG PO TABS
40.0000 mg | ORAL_TABLET | Freq: Every day | ORAL | 1 refills | Status: DC
Start: 2020-03-12 — End: 2022-08-09

## 2020-03-12 NOTE — Progress Notes (Signed)
CARDIOLOGY TELEVISIT PROGRESS NOTE  Date of visit: 03/12/2020    Interpreter service was used: Yes    Confirm Patient Identity Loretta Martin ; [birth year])    In summary patient is 59 year old female with the following medical problems:   H/O non-ST elevation myocardial infarction (NSTEMI)  (primary encounter diagnosis)   Hyperlipidemia, unspecified hyperlipidemia type     Patient was recently admitted to hospital due to non-STEMI type II.  Was consulted by Dr. Lavada Mesi.  Cardiac work-up includes echo, EKG and stress echo was unremarkable.  Slightly troponin elevation.  Was discharged on metoprolol, Lipitor.  Patient actually did not take her metoprolol at home.  She has no cardiac symptoms which include no chest pain, palpitation, presyncope or syncope, shortness of breath, orthopnea paroxysmal nocturnal dyspnea.  Has some vertigo episode after discharge.  Feel lightheadedness recently when she woke up in the morning, self resolved.  Has extra sweating during the night and I advised patient to talk to PCP.        Current Outpatient Medications   Medication Instructions    aspirin EC 81 mg, Oral, DAILY    atorvastatin (LIPITOR) 40 mg, Oral, DAILY    estradiol (ESTRING) 2 MG vaginal ring PLACE 2 MILLIGRAM VAGINALLY EVERY 3 MONTHS    FLUoxetine (PROZAC) 20 MG capsule TAKE 2 CAPSULES BY MOUTH DAILY    fluticasone (FLONASE) 50 MCG/ACT nasal spray 2 sprays, Each Nostril, DAILY    metoprolol (TOPROL-XL) 25 mg, Oral, DAILY    Multiple Vitamin (MULTIVITAMIN) TABS 1 tablet, DAILY    omega-3 acid ethyl esters (LOVAZA) 2 g, 2 TIMES DAILY    PROAIR HFA 108 (90 Base) MCG/ACT inhaler 1 puff, Inhalation, EVERY 4 HOURS PRN    VITAMIN D-400 10 MCG (400 UNIT) TABS Tablet TAKE 1 TABLET BY MOUTH DAILY       Review of Patient's Allergies indicates:   Hydrocodone-acetami*    Nausea and Vomiting   Pcn [penicillins]           Comment:Got a urine infection after using    SOCIAL & FAMILY HISTORY: Were  reviewed and are unchanged from  previous note.    Review of systems: All other systems reviewed were pertinently negative apart from the history of present illness.    PHYSICAL EXAM:  Not performed    PERTINENT INVESTIGATIONS:    1. Last EKG in our record: (on my personal review) normal sinus rhythm.  2. LABS:   Lab Results   Component Value Date    NA 136 01/24/2020    K 4.2 01/24/2020    CL 101 01/24/2020    CO2 29 01/24/2020    BUN 14 01/24/2020    CREAT 0.7 01/24/2020    GLUCOSER 171 (H) 01/24/2020     Lab Results   Component Value Date    LDL 174 01/25/2020    HDL 60 01/25/2020    TG 60 01/25/2020     No results found for: PROBNP  No results found for: BNP    3. ECHO: LVEF 60%.  No wall motion abnormalities.    4. STRESS TEST: Achieved 8 METS.  Nonspecific ST-T segment changes with exercise.  No wall motion abnormalities  noted.    ASSESSMENT:   59 year old female with nausea ,vomiting and  dizziness who was recently admitted to hospital in April this year.  Minimal troponin elevation-unclear reasons.  CAD work-up is negative.  Baseline echocardiogram is normal.  Stress echocardiogram is normal.  No  evidence of ischemia.  She has hyper lipidemia.  PLAN:    Orders Placed This Encounter      atorvastatin (LIPITOR) 40 MG tablet      (I25.2) H/O non-ST elevation myocardial infarction (NSTEMI)  (primary encounter diagnosis)  Comment: No recurrent cardiac symptoms.  No aspirin is needed.  Continue Lipitor 40 mg p.o. daily for hyperlipidemia.  Patient never take metoprolol after hospital discharge.      (E78.5) Hyperlipidemia, unspecified hyperlipidemia type  Comment: Lipitor.  Follow-up lipid panel with PCP and adjust dose accordingly.     Return for follow up in Cardiology Clinic in 1 year. To contact earlier if there are any cardiac related issues.    This Cardiology Division patient encounter note was created using voice-recognition software and in real time during the clinic visit. Please excuse any  typographical errors that have not been edited out.     Electronically signed by: Adin Hector, PA-C, 03/12/2020 5:00 PM

## 2020-03-19 ENCOUNTER — Ambulatory Visit
Admission: RE | Admit: 2020-03-19 | Discharge: 2020-03-19 | Disposition: A | Discharge status: Home / Self Care | Payer: 59 | Attending: Internal Medicine | Admitting: Internal Medicine

## 2020-03-19 ENCOUNTER — Other Ambulatory Visit: Payer: Self-pay

## 2020-03-19 DIAGNOSIS — R42 Dizziness and giddiness: Secondary | ICD-10-CM | POA: Diagnosis not present

## 2020-03-19 NOTE — Progress Notes (Signed)
Hello,    Please:    1. Create a Telephone Encounter for this patient.    2. Share with the patient the attached results: MRI shows no reason for dizziness -- good news! Is she still having it? Should schedule with neuro    Plan:    1. Type of outreach: 1 phone call and if unable to reach send letter    2. Document the conversation in the Telephone Encounter.    3. If the patient has any further questions, please route the encounter back to me.    4. If any prescriptions are required, please route back to me with preferred pharmacy.    5. If any studies are required, please route back to me with confirmation that the patient has agreed to the studies.    Thank you,  Berdine Addison, MD

## 2020-03-20 ENCOUNTER — Telehealth (HOSPITAL_BASED_OUTPATIENT_CLINIC_OR_DEPARTMENT_OTHER): Payer: Self-pay | Admitting: Registered Nurse

## 2020-03-20 NOTE — Telephone Encounter (Signed)
Outreach call attempted to pt via interpreter [facility] ID. no [id number]  Left VM to call the office and ask to speak with the nurse.  Letter generated    Harrietta Guardian, RN, 03/20/2020

## 2020-03-31 DIAGNOSIS — H8110 Benign paroxysmal vertigo, unspecified ear: Secondary | ICD-10-CM | POA: Diagnosis not present

## 2020-03-31 DIAGNOSIS — R42 Dizziness and giddiness: Secondary | ICD-10-CM | POA: Diagnosis not present

## 2020-04-10 ENCOUNTER — Encounter (HOSPITAL_BASED_OUTPATIENT_CLINIC_OR_DEPARTMENT_OTHER): Payer: Self-pay

## 2020-04-10 NOTE — Progress Notes (Signed)
Open Access received a colonoscopy order, message sent to Dr Lavada Mesi to inquire if patient is cleared from a cardiac standpoint to proceed with scheduling the procedure, awaiting response.

## 2020-04-14 ENCOUNTER — Encounter (HOSPITAL_BASED_OUTPATIENT_CLINIC_OR_DEPARTMENT_OTHER): Payer: Self-pay

## 2020-04-14 NOTE — Progress Notes (Signed)
Received below reply from Dr Lavada Mesi re: cardiac clearance for colonoscopy:    Yes, her cardiac work-up was benign. Thanks            Letter sent with OA contact info.

## 2020-04-28 ENCOUNTER — Other Ambulatory Visit (HOSPITAL_BASED_OUTPATIENT_CLINIC_OR_DEPARTMENT_OTHER): Payer: Self-pay | Admitting: Internal Medicine

## 2020-04-29 NOTE — Telephone Encounter (Signed)
PER Pharmacy, Loretta Martin is a 59 year old female has requested a refill of vitamin d.      Last Office Visit: 02/12/2020 with Blau  Last Physical Exam: 12/11/2018    COLONOSCOPY due on 07/31/2019  PAP SMEAR due on 01/27/2020  HPV SCREENING due on 01/27/2020    Other Med Adult:  Most Recent BP Reading(s)  02/01/20 : 102/74        Cholesterol (mg/dL)   Date Value   01/25/2020 255 (*H)     LOW DENSITY LIPOPROTEIN DIRECT (mg/dL)   Date Value   01/25/2020 174     HIGH DENSITY LIPOPROTEIN (mg/dL)   Date Value   01/25/2020 60     TRIGLYCERIDES (mg/dL)   Date Value   01/25/2020 60         THYROID SCREEN TSH REFLEX FT4 (uIU/mL)   Date Value   01/16/2020 4.310 (H)         TSH (THYROID STIM HORMONE) (uIU/mL)   Date Value   05/26/2016 8.300 (H)       HEMOGLOBIN A1C (%)   Date Value   01/16/2020 5.3       No results found for: POCA1C      INR (no units)   Date Value   01/25/2020 1.1   05/12/2009 1.0 (L)       SODIUM (mmol/L)   Date Value   01/24/2020 136       POTASSIUM (mmol/L)   Date Value   01/24/2020 4.2           CREATININE (mg/dL)   Date Value   01/24/2020 0.7       Documented patient preferred pharmacies:    [facility] - [place], [place] - [address] AT [place]  Phone: [phone] Fax: [phone]

## 2020-05-04 DIAGNOSIS — Z20822 Contact with and (suspected) exposure to covid-19: Secondary | ICD-10-CM | POA: Diagnosis not present

## 2020-06-08 ENCOUNTER — Other Ambulatory Visit (HOSPITAL_BASED_OUTPATIENT_CLINIC_OR_DEPARTMENT_OTHER): Payer: Self-pay | Admitting: Internal Medicine

## 2020-06-08 NOTE — Telephone Encounter (Signed)
PER Pharmacy, Loretta Martin is a 59 year old female has requested a refill of aspirin.      Last Office Visit: 02/12/2020 with Madilyn Fireman blau  Last Physical Exam: 12/11/18      Other Med Adult:  Most Recent BP Reading(s)  02/01/20 : 102/74        Cholesterol (mg/dL)   Date Value   01/25/2020 255 (*H)     LOW DENSITY LIPOPROTEIN DIRECT (mg/dL)   Date Value   01/25/2020 174     HIGH DENSITY LIPOPROTEIN (mg/dL)   Date Value   01/25/2020 60     TRIGLYCERIDES (mg/dL)   Date Value   01/25/2020 60         THYROID SCREEN TSH REFLEX FT4 (uIU/mL)   Date Value   01/16/2020 4.310 (H)         TSH (THYROID STIM HORMONE) (uIU/mL)   Date Value   05/26/2016 8.300 (H)       HEMOGLOBIN A1C (%)   Date Value   01/16/2020 5.3       No results found for: POCA1C      INR (no units)   Date Value   01/25/2020 1.1   05/12/2009 1.0 (L)       SODIUM (mmol/L)   Date Value   01/24/2020 136       POTASSIUM (mmol/L)   Date Value   01/24/2020 4.2           CREATININE (mg/dL)   Date Value   01/24/2020 0.7       Documented patient preferred pharmacies:    [facility] [place], [place] - [address] AT [place]  Phone: [phone] Fax: [phone]

## 2020-06-30 ENCOUNTER — Telehealth (HOSPITAL_BASED_OUTPATIENT_CLINIC_OR_DEPARTMENT_OTHER): Payer: Self-pay | Admitting: Registered Nurse

## 2020-06-30 ENCOUNTER — Emergency Department: Admission: RE | Admit: 2020-06-30 | Discharge: 2020-06-30 | Discharge status: Against Medical Advice | Payer: 59

## 2020-06-30 NOTE — Telephone Encounter (Cosign Needed)
Pt c/o right sided chest pain  Just above her right breast  Does feel like indigestion  Has no burping/ nausea or vomiting    States pain was constant most of Thursday  Went away without any medication  Friday / Sat and Sunday intermittent discomfort    Today pain has returned/ complains increase pain with deep breath  Pain doesn't radiate  No diaphoresis  No dizziness  No headaches     With family member who agrees to take Verdis Frederickson to the ER now  Concerns previous elevated troponin/ normal cardiac work up this year    Patient Active Problem List    Elevated troponin [R77.8]      Dizzy [R42]      Subclinical hypothyroidism [E03.9]      BMI 26.0-26.9,adult [Z68.26]      Seasonal allergic rhinitis [J30.2]      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

## 2020-06-30 NOTE — Telephone Encounter (Signed)
Regarding: chest pains  ----- Message from Vickey Sages sent at 06/30/2020  4:50 PM EDT -----  [name] [id number], 59 year old, female    Calls today:  Clinical Questions ([place])    Name of person calling Loretta Martin   Specific nature of request [name] is calling with a complaint of chest pain. She has been having this pain since Thursday. Televisit with PCP was scheduled per patient preference, but a call back is needed as well. Thanks!   Return phone number [phone]  Person calling on behalf of patient: Patient (self)    CALL BACK NUMBER: [phone]  Best time to call back:   Cell phone:   Other phone:    Patient's language of care: English    Patient does not need an interpreter.    Patient's PCP: Berdine Addison, MD

## 2020-06-30 NOTE — Telephone Encounter (Signed)
Returned patient call    [facility] [address] [address] to reach. Did attempt x 2     Left message to call on call telecom after hours     Before closing record, did consult with nursing peers and RN did triage and advised patient to go to the ED.

## 2020-07-01 ENCOUNTER — Ambulatory Visit
Admission: RE | Admit: 2020-07-01 | Discharge: 2020-07-01 | Disposition: A | Discharge status: Home / Self Care | Payer: 59 | Attending: Internal Medicine | Admitting: Internal Medicine

## 2020-07-01 ENCOUNTER — Ambulatory Visit (HOSPITAL_BASED_OUTPATIENT_CLINIC_OR_DEPARTMENT_OTHER): Payer: 59 | Admitting: Internal Medicine

## 2020-07-01 DIAGNOSIS — R0781 Pleurodynia: Secondary | ICD-10-CM

## 2020-07-01 DIAGNOSIS — N644 Mastodynia: Secondary | ICD-10-CM | POA: Diagnosis present

## 2020-07-01 NOTE — Progress Notes (Signed)
TELEVISIT    She has pain on her breast on the right. It hurts when she breathes. It is toward her armpit. It hurts when she breathes and makes her cough. It started last Thursday. No fever, cough or runny nose. No lumps or bumps on the breast. It hurts mainly when she breathes but also when she moves her arm. She was told to go ED by nursing yesterday but didn't feel it was necessary. It's the right breast when she touches it almost at the armpit and also when she coughs it hurts.     Echo and stress echo normal 03/2020.    Patient Active Problem List:     Nonspecific reaction to tuberculin skin test without active tuberculosis     Major depressive disorder, recurrent, in remission ([place])     Mammographic microcalcification, normal BX     PVD (posterior vitreous detachment)     Asymptomatic neurocysticercosis     Female stress incontinence     Family history of diabetes mellitus type II     Seasonal allergic rhinitis     BMI 26.0-26.9,adult     Varicose veins of lower extremity     Subclinical hypothyroidism     Elevated troponin     Dizzy    A/P:      Breast pain, right  Pleuritic pain  Unclear etiology. Difficult to determine pleuritic chest vs intercostal (possible) vs breast. Will get U/S and mammo and XR chest to begin. If all of these OK, would be very reassuring. Incidentally due for screening mammogram.   -     [place] US BREAST-AXILLA RIGHT; Future  -     [place] DIAGNOSTIC MAMMO UNI RIGHT DIGITAL WITH DBT & CAD; Future  -     [place] SCREENING MAMMO BILATERAL DIGITAL WITH DBT & CAD; Future  -     XR CHEST 2 VIEWS; Future  - To ED if suddenly worsening

## 2020-07-10 ENCOUNTER — Encounter (HOSPITAL_BASED_OUTPATIENT_CLINIC_OR_DEPARTMENT_OTHER): Payer: Self-pay

## 2020-07-10 ENCOUNTER — Other Ambulatory Visit (HOSPITAL_BASED_OUTPATIENT_CLINIC_OR_DEPARTMENT_OTHER): Payer: Self-pay | Admitting: Internal Medicine

## 2020-07-10 ENCOUNTER — Other Ambulatory Visit: Payer: Self-pay

## 2020-07-10 ENCOUNTER — Ambulatory Visit
Admission: RE | Admit: 2020-07-10 | Discharge: 2020-07-10 | Disposition: A | Discharge status: Home / Self Care | Payer: 59 | Attending: Internal Medicine | Admitting: Internal Medicine

## 2020-07-10 ENCOUNTER — Ambulatory Visit (HOSPITAL_BASED_OUTPATIENT_CLINIC_OR_DEPARTMENT_OTHER)
Admission: RE | Admit: 2020-07-10 | Discharge: 2020-07-10 | Disposition: A | Discharge status: Home / Self Care | Payer: 59 | Source: Ambulatory Visit

## 2020-07-10 DIAGNOSIS — N644 Mastodynia: Secondary | ICD-10-CM | POA: Diagnosis present

## 2020-07-10 DIAGNOSIS — R922 Inconclusive mammogram: Secondary | ICD-10-CM | POA: Diagnosis present

## 2020-07-25 ENCOUNTER — Encounter (HOSPITAL_BASED_OUTPATIENT_CLINIC_OR_DEPARTMENT_OTHER): Payer: Self-pay

## 2020-09-13 ENCOUNTER — Telehealth (HOSPITAL_BASED_OUTPATIENT_CLINIC_OR_DEPARTMENT_OTHER): Payer: Self-pay

## 2020-09-13 NOTE — Telephone Encounter (Signed)
Outreached patient to reassign PCP - Dr. Farley Ly    Left message for patient, also sent letter

## 2020-09-29 DIAGNOSIS — U071 COVID-19: Secondary | ICD-10-CM | POA: Diagnosis not present

## 2021-03-11 ENCOUNTER — Ambulatory Visit (HOSPITAL_BASED_OUTPATIENT_CLINIC_OR_DEPARTMENT_OTHER): Payer: 59 | Admitting: Internal Medicine

## 2021-03-14 ENCOUNTER — Emergency Department (HOSPITAL_BASED_OUTPATIENT_CLINIC_OR_DEPARTMENT_OTHER): Payer: 59

## 2021-03-14 ENCOUNTER — Emergency Department
Admission: EM | Admit: 2021-03-14 | Discharge: 2021-03-14 | Disposition: A | Discharge status: Home / Self Care | Payer: 59 | Attending: Emergency Medicine | Admitting: Emergency Medicine

## 2021-03-14 DIAGNOSIS — R0789 Other chest pain: Secondary | ICD-10-CM | POA: Diagnosis present

## 2021-03-14 DIAGNOSIS — Z20822 Contact with and (suspected) exposure to covid-19: Secondary | ICD-10-CM | POA: Insufficient documentation

## 2021-03-14 DIAGNOSIS — R079 Chest pain, unspecified: Secondary | ICD-10-CM | POA: Diagnosis not present

## 2021-03-14 DIAGNOSIS — R059 Cough, unspecified: Secondary | ICD-10-CM | POA: Insufficient documentation

## 2021-03-14 DIAGNOSIS — J069 Acute upper respiratory infection, unspecified: Secondary | ICD-10-CM | POA: Insufficient documentation

## 2021-03-14 LAB — BASIC METABOLIC PANEL
ANION GAP: 12 mmol/L (ref 10–22)
BUN (UREA NITROGEN): 13 mg/dL (ref 7–18)
CALCIUM: 9.3 mg/dl (ref 8.5–10.1)
CARBON DIOXIDE: 24 mmol/L (ref 21–32)
CHLORIDE: 103 mmol/L (ref 98–107)
CREATININE: 0.7 mg/dL (ref 0.4–1.2)
ESTIMATED GLOMERULAR FILT RATE: >60 mL/min (ref >60)
Glucose Random: 109 mg/dL (ref 74–160)
POTASSIUM: 4.1 mmol/L (ref 3.5–5.1)
SODIUM: 138 mmol/L (ref 136–145)

## 2021-03-14 LAB — RESPIRATORY PANEL BASIC INPAT
INFLUENZA A: NEGATIVE
INFLUENZA B: NEGATIVE
RESPIRATORY SYNCYTIAL VIRUS: NEGATIVE
SARS-COV-2: NEGATIVE

## 2021-03-14 LAB — CBC, PLATELET & DIFFERENTIAL
ABSOLUTE BASO COUNT: 0 10*3/uL (ref 0.0–0.1)
ABSOLUTE EOSINOPHIL COUNT: 0.2 10*3/uL (ref 0.0–0.8)
ABSOLUTE IMM GRAN COUNT: 0.01 10*3/uL (ref 0.00–0.03)
ABSOLUTE LYMPH COUNT: 1.9 10*3/uL (ref 0.6–5.9)
ABSOLUTE MONO COUNT: 0.8 10*3/uL (ref 0.2–1.4)
ABSOLUTE NEUTROPHIL COUNT: 4 10*3/uL (ref 1.6–8.3)
ABSOLUTE NRBC COUNT: 0 10*3/uL (ref 0.0–0.0)
BASOPHIL %: 0.4 % (ref 0.0–1.2)
EOSINOPHIL %: 2.4 % (ref 0.0–7.0)
HEMATOCRIT: 37.1 % (ref 34.1–44.9)
HEMOGLOBIN: 12.5 g/dL (ref 11.2–15.7)
IMMATURE GRANULOCYTE %: 0.1 % (ref 0.0–0.4)
LYMPHOCYTE %: 27.5 % (ref 15.0–54.0)
MEAN CORP HGB CONC: 33.7 g/dL (ref 31.0–37.0)
MEAN CORPUSCULAR HGB: 31.6 pg (ref 26.0–34.0)
MEAN CORPUSCULAR VOL: 93.7 fl (ref 80.0–100.0)
MEAN PLATELET VOLUME: 9.4 fL (ref 8.7–12.5)
MONOCYTE %: 11.8 % (ref 4.0–13.0)
NEUTROPHIL %: 57.8 % (ref 40.0–75.0)
NRBC %: 0 % (ref 0.0–0.0)
PLATELET COUNT: 166 10*3/uL (ref 150–400)
RBC DISTRIBUTION WIDTH STD DEV: 42.9 fL (ref 35.1–46.3)
RED BLOOD CELL COUNT: 3.96 M/uL (ref 3.90–5.20)
WHITE BLOOD CELL COUNT: 7 10*3/uL (ref 4.0–11.0)

## 2021-03-14 LAB — TROPONIN T HS BASELINE: TROPONIN T HS BASELINE: <6 ng/L (ref 0–10)

## 2021-03-14 LAB — TROPONIN T HS 1 HOUR: TROPONIN T HS 1 HOUR RESULT: 6 ng/L (ref 0–10)

## 2021-03-14 MED ORDER — PSEUDOEPHEDRINE HCL ER 120 MG PO TB12
120.00 mg | ORAL_TABLET | Freq: Two times a day (BID) | ORAL | 0 refills | Status: AC
Start: 2021-03-14 — End: 2021-03-21

## 2021-03-14 MED ORDER — PSEUDOEPHEDRINE HCL 30 MG PO TABS
30.00 mg | ORAL_TABLET | Freq: Once | ORAL | Status: AC
Start: 2021-03-14 — End: 2021-03-14
  Administered 2021-03-14: 30 mg via ORAL
  Filled 2021-03-14: qty 1

## 2021-03-14 MED ORDER — BENZONATATE 100 MG PO CAPS
100.00 mg | ORAL_CAPSULE | Freq: Three times a day (TID) | ORAL | 0 refills | Status: AC | PRN
Start: 2021-03-14 — End: 2021-03-21

## 2021-03-14 NOTE — Narrator Note (Signed)
Patient Disposition  Patient education for diagnosis, medications, activity, diet and follow-up.  Patient left ED 10:30 PM.  Patient rep received written instructions.    Interpreter to provide instructions: Yes; Interpreter ID: [email or web address] interpreter    Patient belongings with patient: YES    Have all existing LDAs been addressed? Yes    Have all IV infusions been stopped? N/A    Destination: Discharged to home. Pt given D/C instructions. Pt encouraged to follow up with her PCP regarding her elevated blood pressure. Pt verbalized understanding.  Pt advised if symptoms worsen to return to the ED.

## 2021-03-14 NOTE — ED Provider Notes (Signed)
The patient was seen primarily by me. ED nursing record was reviewed. Prior records as available electronically through the Epic record were reviewed.         HPI:    This is a 60 year old female patient complaining of dry cough for 4 days, congestion, chest heaviness today.  Patient has been trying over-the-counter medications with minimal relief.  Denies any sick contacts.  Negative COVID test at home.  Vaccinated against coronavirus.  Denies any shortness of breath, nausea, vomiting diarrhea.  Occasionally using inhaler with mild relief.        ROS: Pertinent positives were reviewed as per the HPI above. All other systems were reviewed and are negative.      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      Comment:  treated [facility] 11/97 CSF VDRL neg 9/00 RPR 9/00 1:2 repeat                RPR yearly   08/31/2004: Nonspecific abnormal results of thyroid function study      Comment:  increased TSH 4.19 6/00; TSH nl 9/00; 05/04/99 - wnl  04/23/2009: Presbyopia  04/23/2009: PVD (posterior vitreous detachment)  08/31/2004: Trichomoniasis, unspecified      Comment:  10/97  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      Comment:  +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 - had negative CXR pp at [facility] referred to TB               clinic  08/31/2004: Urinary tract infection, site not specified      Comment:  4/02, 6/02  Patient Active Problem List:     Nonspecific reaction to tuberculin skin test without active tuberculosis     Major depressive disorder, recurrent, in remission ([place])     Mammographic microcalcification, normal BX     PVD (posterior vitreous detachment)     Asymptomatic neurocysticercosis     Female stress incontinence     Family history of diabetes mellitus type II     Seasonal allergic rhinitis     BMI  26.0-26.9,adult     Varicose veins of lower extremity     Subclinical hypothyroidism     Elevated troponin     Dizzy        Past Surgical History: Past Surgical History:  No date: BREAST BIOPSY  No date: BREAST BIOPSY & NEEDLE LOC WIR  No date: EXCISION EXCESSIVE SKIN & SUBQ TISSUE ABDOMEN      Comment:  abdominoplasty in [place]  1998: LIG/TRNSXJ FLP TUBE ABDL/VAG APPR UNI/BI      Comment:  Tubal Ligation  No date: OB ANTEPARTUM CARE CESAREAN DLVR & POSTPARTUM      Comment:  c/section x 2  2004: REDUCTION MAMMAPLASTY  No date: TRURL RF FEMALE BLADDER NECK STRS URIN INCONT      Medications:   Current Facility-Administered Medications   Medication    pseudoephedrine (SUDAFED) tablet 30 mg     Current Outpatient Medications   Medication Sig    pseudoephedrine (SUDAFED 12 HOUR) 120 MG 12 hr tablet Take 1 tablet by mouth every 12 (twelve) hours  for 7 days    benzonatate (TESSALON PERLES) 100 MG capsule Take 1-2  capsules by mouth every 8 (eight) hours as needed for Cough for cough  for up to 7 days    aspirin 81 MG EC tablet TAKE 1 TABLET BY MOUTH DAILY    VITAMIN D-400 10 MCG (400 UNIT) TABS Tablet TAKE 1 TABLET BY MOUTH DAILY    atorvastatin (LIPITOR) 40 MG tablet Take 1 tablet by mouth daily    FLUoxetine (PROZAC) 20 MG capsule TAKE 2 CAPSULES BY MOUTH DAILY    metoprolol (TOPROL-XL) 25 MG 24 hr tablet Take 1 tablet by mouth daily    PROAIR HFA 108 (90 Base) MCG/ACT inhaler Inhale 1 puff into the lungs every 4 (four) hours as needed for Wheezing    fluticasone (FLONASE) 50 MCG/ACT nasal spray 2 sprays by Each Nostril route daily    Multiple Vitamin (MULTIVITAMIN) TABS Take 1 tablet by mouth daily.    omega-3 acid ethyl esters (LOVAZA) 1 GM capsule Take 2 g by mouth 2 (two) times daily.         Social History: Social History    Tobacco Use      Smoking status: Former Smoker        Start date: 10/05/2003        Quit date: 08/10/2004        Years since quitting: 16.6      Smokeless tobacco: Former Systems developer       Tobacco comment: smoked only for 6 months.    Alcohol use: Yes      Comment: minimal        Allergies:  Review of Patient's Allergies indicates:   Hydrocodone-acetami*    Nausea and Vomiting   Pcn [penicillins]           Comment:Got a urine infection after using      Physical Exam:  ED Triage Vitals [03/14/21 2006]   ED Triage Vitals Brief Group      Temp 98.4 F      Pulse 102      Resp 18      BP (!) 142/104      SpO2 95 %      Pain Score        GENERAL: No acute distress.   SKIN:  Warm & Dry, no rash.  HEAD: Atraumatic. PERRL. EOMI.  Oropharynx: clear.  NECK: No midline tenderness.  No LAN.   LUNGS:  Clear to auscultation bilaterally. No wheezes, rales, rhonchi.   HEART:  RRR.  No murmurs, rubs, or gallops.   ABDOMEN:  Soft, NTND.  No guarding or rebound tenderness.   MUSCULOSKELETAL:  No obvious deformities.    NEUROLOGIC: Alert and oriented.  Moves all extremities well.  PSYCHIATRIC:  Appropriate for age, time of day, and situation    Recent Lab Results:  Labs Reviewed   CBC, PLATELET & DIFFERENTIAL   BASIC METABOLIC PANEL   TROPONIN T HS BASELINE   TROPONIN T HS 1 HOUR   RESPIRATORY PANEL BASIC INPAT    Narrative:     Is this the patient's first Covid test?->No                  Is the patient employed in healthcare?->Unknown                  Is the patient symptomatic as defined by the CDC?->Yes                  Is the patient hospitalized?->Yes  Is this an ICU patient?->No                  Is the patient a resident in a congregate care setting?                  (nursing home, residential care, psych facili                  ties,group home, homeless shelter, foster care.                  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  Patient EKG showing normal sinus rhythm at 99 bpm, no ischemia, no abnormal intervals.  Labs sent unremarkable.  Troponin negative x2.  Chest x-ray unremarkable.  Patient given Sudafed for symptomatic relief.  Stable for discharge with outpatient follow-up, supportive measures, PCR COVID testing pending at time of discharge.         Additional verbal discharge instructions were provided including patients diagnosis and follow up plan, as well as reasons to return to the Emergency Department which were discussed in detail.  Patient is agreeable with this management.         Condition: Improved and Stable    Disposition:  Discharged to home    Diagnosis/Diagnoses:  Viral URI with cough        Loretta Martin  Attending Physician  Emergency [place]  [phone]

## 2021-03-14 NOTE — ED Triage Note (Signed)
Pt self presents to the ED, ambulatory, complaining of a dry cough/seasonal allergy symptoms for 4 days, worsening over the last 2 days. Pt reports a heaviness in her upper chest today. Pt denies SOB or N/V/D. Pt NSR on the monitor on triage, O2 sat 95% on RA.

## 2021-03-20 LAB — EKG

## 2021-03-26 ENCOUNTER — Other Ambulatory Visit (HOSPITAL_BASED_OUTPATIENT_CLINIC_OR_DEPARTMENT_OTHER): Payer: Self-pay | Admitting: Internal Medicine

## 2021-03-26 DIAGNOSIS — J301 Allergic rhinitis due to pollen: Secondary | ICD-10-CM

## 2021-03-27 NOTE — Telephone Encounter (Signed)
PER Pharmacy, Loretta Martin is a 60 year old female has requested a refill of      - Albuterol HFa      Last Office Visit: 07/01/20 with Berdine Addison    Last Physical Exam: 12/11/18     COLONOSCOPY due on 07/31/2019  PAP SMEAR due on 01/27/2020  HPV SCREENING due on 01/27/2020     Other Med Adult:  Most Recent BP Reading(s)  03/14/21 : (!) 160/104        Cholesterol (mg/dL)   Date Value   01/25/2020 255 (*H)     LOW DENSITY LIPOPROTEIN DIRECT (mg/dL)   Date Value   01/25/2020 174     HIGH DENSITY LIPOPROTEIN (mg/dL)   Date Value   01/25/2020 60     TRIGLYCERIDES (mg/dL)   Date Value   01/25/2020 60         THYROID SCREEN TSH REFLEX FT4 (uIU/mL)   Date Value   01/16/2020 4.310 (H)         TSH (THYROID STIM HORMONE) (uIU/mL)   Date Value   05/26/2016 8.300 (H)       HEMOGLOBIN A1C (%)   Date Value   01/16/2020 5.3       No results found for: POCA1C      INR (no units)   Date Value   01/25/2020 1.1   05/12/2009 1.0 (L)       SODIUM (mmol/L)   Date Value   03/14/2021 138       POTASSIUM (mmol/L)   Date Value   03/14/2021 4.1           CREATININE (mg/dL)   Date Value   03/14/2021 0.7        Documented patient preferred pharmacies:    [facility] - [place], [place] - [place]  Phone: [phone] Fax: [phone]

## 2021-04-03 ENCOUNTER — Telehealth (HOSPITAL_BASED_OUTPATIENT_CLINIC_OR_DEPARTMENT_OTHER): Payer: Self-pay

## 2021-04-03 NOTE — Telephone Encounter (Signed)
-----   Message from [name], MD sent at 04/01/2021 11:14 AM EDT -----  Faith Rogue, she seems to have changed to her PCP to Dr. Stann Mainland, but has follow up with me in July - can you call & sort this out - if wants to change back to me that's fine to keep apt - but if not should be rescheduled with PCP. If wants to change back to me, please change PCP field. Thanks, [employer]

## 2021-04-03 NOTE — Telephone Encounter (Signed)
Telephone call to pt. Left voicemail message for pt to call to confirm PCP.

## 2021-04-13 ENCOUNTER — Ambulatory Visit: Payer: 59 | Attending: Internal Medicine | Admitting: Internal Medicine

## 2021-04-13 ENCOUNTER — Other Ambulatory Visit: Payer: Self-pay

## 2021-04-13 VITALS — BP 111/64 | HR 79 | Temp 97.8°F | Ht 64.0 in | Wt 143.2 lb

## 2021-04-13 DIAGNOSIS — Z1211 Encounter for screening for malignant neoplasm of colon: Secondary | ICD-10-CM | POA: Diagnosis present

## 2021-04-13 DIAGNOSIS — F3340 Major depressive disorder, recurrent, in remission, unspecified: Secondary | ICD-10-CM | POA: Diagnosis present

## 2021-04-13 DIAGNOSIS — E038 Other specified hypothyroidism: Secondary | ICD-10-CM

## 2021-04-13 LAB — THYROID SCREEN TSH REFLEX FT4: THYROID SCREEN TSH REFLEX FT4: 6.04 u[IU]/mL — ABNORMAL HIGH (ref 0.270–4.200)

## 2021-04-13 LAB — FREE THYROXINE: FREE THYROXINE: 0.89 ng/dL — ABNORMAL LOW (ref 0.93–1.70)

## 2021-04-13 LAB — VITAMIN D,25 HYDROXY: VITAMIN D,25 HYDROXY: 28 ng/mL — ABNORMAL LOW (ref 30.0–100.0)

## 2021-04-13 NOTE — Progress Notes (Signed)
follow up depression & ED visit    ED visit  Seen for a cough/chest congestion 4 weeks ago, records reviewed  Notes that she feels entirely improved  She had elevated BP in the ED & needed follow up for that    Depression  She notes that her energy is sometimes low, she feels down occasionally  But her depression is not causing significant day-to-day problems for her at present  She continues to take her fluoxetine 40 mg & finds that to be helpful    Has h/o subclinical hypothyroidism  No symptoms of hypo or hyperthyroidism: no decreased or increased weight, no feeling cold/chilly or excessively warm, no diarrhea or constipation, no undue sweatiness, anxiety or palpitations.     BP 111/64 (Site: LA, Position: Sitting, Cuff Size: Reg)    Pulse 79    Temp 97.8 F (36.6 C) (Temporal)    Ht 5\' 4"  (1.626 m)    Wt 65 kg (143 lb 3.2 oz)    LMP 11/02/2009    SpO2 95%    BMI 24.58 kg/m   Heart: S1 and S2 normal, no murmurs, clicks, gallops or rubs. Regular rate and rhythm.   Lungs:  clear; no wheezes, rhonchi or rales.  MMSE: well groomed, affect normal, no tangential thoughts, no pressured speech, no agitation or psychomotor slowing, normal response-time to questions    ASSESSMENT & PLAN:  (E03.8) Subclinical hypothyroidism  (primary encounter diagnosis)  Comment: will check thyroid studies      (Z12.11) Special screening for malignant neoplasms, colon  Comment: We provided the patient with materials to collect the fecal immunoassay occult blood test.   I explained the sample could mailed back to [place] at clinic but that the patient will need to add stamps or drop them off.  I explained that this is an important test to find early colon cancer that needs to be completed every year.  After we receive the results, will also have option of colonoscopy.   Plan: POC IMMUNOASSAY FECAL OCCULT BLOOD TEST,         THYROID SCREEN TSH REFLEX FT4            (F33.40) Major depressive disorder, recurrent, in remission ([place])  Comment:  provided supportive counseling - continue fluoxetine 40 mg daily    Has had borderlines vit D in past  - will check  Plan: VITAMIN D,25 HYDROXY              The patient was ready to learn and no apparent learning or adherence barriers were identified. I explained the diagnosis and treatment plan, and the patient expressed understanding of the content. I attempted to answer any questions regarding the diagnosis and the proposed treatment.    Possible side effects of the prescribed medication was explained. We discussed the patients current medications.  We discussed the importance of medication compliance. The patient expressed understanding and no barriers to adherence were identified.    follow-up will be scheduled for 1 year from now  she has been advised to call or return with any worsening or new problems

## 2021-04-15 ENCOUNTER — Telehealth (HOSPITAL_BASED_OUTPATIENT_CLINIC_OR_DEPARTMENT_OTHER): Payer: Self-pay | Admitting: Internal Medicine

## 2021-04-15 DIAGNOSIS — E038 Other specified hypothyroidism: Secondary | ICD-10-CM

## 2021-04-15 NOTE — Telephone Encounter (Signed)
Katio,  Let her know her thyroid tests remain slightly off - next step is to repeat them again in 3-4 months - she should schedule blood draw  Thanks, [employer]

## 2021-04-17 NOTE — Telephone Encounter (Signed)
Telephone call to pt. Left voicemail message on answering machine.

## 2021-05-05 ENCOUNTER — Encounter (HOSPITAL_BASED_OUTPATIENT_CLINIC_OR_DEPARTMENT_OTHER): Payer: Self-pay | Admitting: Internal Medicine

## 2021-05-06 MED ORDER — FLUOXETINE HCL 20 MG PO CAPS
20.0000 mg | ORAL_CAPSULE | Freq: Every day | ORAL | 3 refills | Status: DC
Start: 2021-05-06 — End: 2022-06-23

## 2021-05-06 NOTE — Telephone Encounter (Signed)
PER Patient (self), Loretta Martin is a 60 year old female has requested a refill of FLUoxetine (PROZAC) 20 MG capsule         Last Office Visit:04/13/2021 with Luanna Salk Physical Exam: 12/11/2018    COLONOSCOPY due on 07/31/2019  PAP SMEAR due on 01/27/2020  HPV SCREENING due on 01/27/2020    Other Med Adult:  Most Recent BP Reading(s)  04/13/21 : 111/64        Cholesterol (mg/dL)   Date Value   01/25/2020 255 (*H)     LOW DENSITY LIPOPROTEIN DIRECT (mg/dL)   Date Value   01/25/2020 174     HIGH DENSITY LIPOPROTEIN (mg/dL)   Date Value   01/25/2020 60     TRIGLYCERIDES (mg/dL)   Date Value   01/25/2020 60         THYROID SCREEN TSH REFLEX FT4 (uIU/mL)   Date Value   04/13/2021 6.040 (H)         TSH (THYROID STIM HORMONE) (uIU/mL)   Date Value   05/26/2016 8.300 (H)       HEMOGLOBIN A1C (%)   Date Value   01/16/2020 5.3       No results found for: POCA1C      INR (no units)   Date Value   01/25/2020 1.1   05/12/2009 1.0 (L)       SODIUM (mmol/L)   Date Value   03/14/2021 138       POTASSIUM (mmol/L)   Date Value   03/14/2021 4.1           CREATININE (mg/dL)   Date Value   03/14/2021 0.7       Documented patient preferred pharmacies:    [facility] - [place], [place] - [place]  Phone: [phone] Fax: [phone]

## 2021-07-08 ENCOUNTER — Other Ambulatory Visit (HOSPITAL_BASED_OUTPATIENT_CLINIC_OR_DEPARTMENT_OTHER): Payer: Self-pay | Admitting: Internal Medicine

## 2021-07-08 DIAGNOSIS — J301 Allergic rhinitis due to pollen: Secondary | ICD-10-CM

## 2021-07-08 NOTE — Telephone Encounter (Signed)
PER Pharmacy, Loretta Martin is a 60 year old female has requested a refill of albuterol inhaler.      Last Office Visit: 04-13-21 with pcp  Last Physical Exam: 12-11-18    COLONOSCOPY due on 07/31/2019  PAP SMEAR due on 01/27/2020  HPV SCREENING due on 01/27/2020    Other Med Adult:  Most Recent BP Reading(s)  04/13/21 : 111/64        Cholesterol (mg/dL)   Date Value   01/25/2020 255 (*H)     LOW DENSITY LIPOPROTEIN DIRECT (mg/dL)   Date Value   01/25/2020 174     HIGH DENSITY LIPOPROTEIN (mg/dL)   Date Value   01/25/2020 60     TRIGLYCERIDES (mg/dL)   Date Value   01/25/2020 60         THYROID SCREEN TSH REFLEX FT4 (uIU/mL)   Date Value   04/13/2021 6.040 (H)         TSH (THYROID STIM HORMONE) (uIU/mL)   Date Value   05/26/2016 8.300 (H)       HEMOGLOBIN A1C (%)   Date Value   01/16/2020 5.3       No results found for: POCA1C      INR (no units)   Date Value   01/25/2020 1.1   05/12/2009 1.0 (L)       SODIUM (mmol/L)   Date Value   03/14/2021 138       POTASSIUM (mmol/L)   Date Value   03/14/2021 4.1           CREATININE (mg/dL)   Date Value   03/14/2021 0.7       Documented patient preferred pharmacies:    [facility] - [place], [place] - [place]  Phone: [phone] Fax: [phone]

## 2021-07-17 ENCOUNTER — Other Ambulatory Visit (HOSPITAL_BASED_OUTPATIENT_CLINIC_OR_DEPARTMENT_OTHER): Payer: Self-pay | Admitting: Internal Medicine

## 2021-07-18 NOTE — Telephone Encounter (Signed)
PER Pharmacy, Loretta Martin is a 60 year old female has requested a refill of      -  vitamin d       Last Office Visit: 04/13/21 with cohen   Last Physical Exam: 12/11/18     COLONOSCOPY due on 07/31/2019  PAP SMEAR due on 01/27/2020  HPV SCREENING due on 01/27/2020     Other Med Adult:  Most Recent BP Reading(s)  04/13/21 : 111/64        Cholesterol (mg/dL)   Date Value   01/25/2020 255 (*H)     LOW DENSITY LIPOPROTEIN DIRECT (mg/dL)   Date Value   01/25/2020 174     HIGH DENSITY LIPOPROTEIN (mg/dL)   Date Value   01/25/2020 60     TRIGLYCERIDES (mg/dL)   Date Value   01/25/2020 60         THYROID SCREEN TSH REFLEX FT4 (uIU/mL)   Date Value   04/13/2021 6.040 (H)         TSH (THYROID STIM HORMONE) (uIU/mL)   Date Value   05/26/2016 8.300 (H)       HEMOGLOBIN A1C (%)   Date Value   01/16/2020 5.3       No results found for: POCA1C      INR (no units)   Date Value   01/25/2020 1.1   05/12/2009 1.0 (L)       SODIUM (mmol/L)   Date Value   03/14/2021 138       POTASSIUM (mmol/L)   Date Value   03/14/2021 4.1           CREATININE (mg/dL)   Date Value   03/14/2021 0.7        Documented patient preferred pharmacies:    [facility] - [place], [place] - [place]  Phone: [phone] Fax: [phone]

## 2021-10-11 ENCOUNTER — Emergency Department
Admission: EM | Admit: 2021-10-11 | Discharge: 2021-10-11 | Disposition: A | Discharge status: Home / Self Care | Payer: 59 | Attending: Emergency Medicine | Admitting: Emergency Medicine

## 2021-10-11 ENCOUNTER — Emergency Department (HOSPITAL_BASED_OUTPATIENT_CLINIC_OR_DEPARTMENT_OTHER): Payer: 59

## 2021-10-11 DIAGNOSIS — M79604 Pain in right leg: Secondary | ICD-10-CM | POA: Diagnosis not present

## 2021-10-11 DIAGNOSIS — M7989 Other specified soft tissue disorders: Secondary | ICD-10-CM | POA: Diagnosis not present

## 2021-10-11 DIAGNOSIS — M545 Low back pain, unspecified: Secondary | ICD-10-CM | POA: Diagnosis present

## 2021-10-11 DIAGNOSIS — M5441 Lumbago with sciatica, right side: Secondary | ICD-10-CM | POA: Insufficient documentation

## 2021-10-11 DIAGNOSIS — M5431 Sciatica, right side: Secondary | ICD-10-CM

## 2021-10-11 MED ORDER — ACETAMINOPHEN 500 MG PO TABS
1000.0000 mg | ORAL_TABLET | Freq: Once | ORAL | Status: AC
Start: 2021-10-11 — End: 2021-10-11
  Administered 2021-10-11: 1000 mg via ORAL
  Filled 2021-10-11: qty 2

## 2021-10-11 MED ORDER — IBUPROFEN 600 MG PO TABS
600.0000 mg | ORAL_TABLET | Freq: Once | ORAL | Status: AC
Start: 2021-10-11 — End: 2021-10-11
  Administered 2021-10-11: 600 mg via ORAL
  Filled 2021-10-11: qty 1

## 2021-10-11 MED ORDER — METHOCARBAMOL 750 MG PO TABS
750.00 mg | ORAL_TABLET | Freq: Four times a day (QID) | ORAL | 0 refills | Status: AC | PRN
Start: 2021-10-11 — End: 2021-10-18

## 2021-10-11 NOTE — ED Provider Notes (Signed)
The patient was seen primarily by me. ED nursing record was reviewed. Select prior records as available electronically through the Epic record were reviewed.      HPI:    Loretta Martin is a 61 year old female patient who presents with right leg pain since Thursday, and noticed swelling today. Feels like pain starts in low back, shoots down the leg. No fall or trauma.     ROS: Pertinent positives were reviewed as per the HPI above. All other systems were reviewed and are negative.  Garen Grams  Language of care: English  MRN: [id number]  PCP: [name], MD  Mode of arrival to ED: Relative.  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      Comment:  treated [facility] 11/97 CSF VDRL neg 9/00 RPR 9/00 1:2 repeat                RPR yearly   08/31/2004: Nonspecific abnormal results of thyroid function study      Comment:  increased TSH 4.19 6/00; TSH nl 9/00; 05/04/99 - wnl  04/23/2009: Presbyopia  04/23/2009: PVD (posterior vitreous detachment)  08/31/2004: Trichomoniasis, unspecified      Comment:  10/97  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      Comment:  +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 - had negative CXR pp at [facility] referred to TB               clinic  08/31/2004: Urinary tract infection, site not specified      Comment:  4/02, 6/02  Patient Active Problem List:     Nonspecific reaction to tuberculin skin test without active tuberculosis     Major depressive disorder, recurrent, in remission ([place])     Mammographic microcalcification, normal BX     PVD (posterior vitreous detachment)     Asymptomatic neurocysticercosis     Female stress incontinence     Family history of diabetes mellitus type II     Seasonal allergic rhinitis     BMI  26.0-26.9,adult     Varicose veins of lower extremity     Subclinical hypothyroidism     Elevated troponin     Dizzy    Past Surgical History: Past Surgical History:  No date: BREAST BIOPSY  No date: BREAST BIOPSY & NEEDLE LOC WIR  No date: EXCISION EXCESSIVE SKIN & SUBQ TISSUE ABDOMEN      Comment:  abdominoplasty in [place]  1998: LIG/TRNSXJ FLP TUBE ABDL/VAG APPR UNI/BI      Comment:  Tubal Ligation  No date: OB ANTEPARTUM CARE CESAREAN DLVR & POSTPARTUM      Comment:  c/section x 2  2004: REDUCTION MAMMAPLASTY  No date: TRURL RF FEMALE BLADDER NECK STRS URIN INCONT  Social History:   Social History     Socioeconomic History    Marital status: Divorced     Spouse name: Not on file    Number of children: 3    Years of education: Not on file    Highest education level: Not on file   Occupational History    Occupation: housecleaning  Employer: [employer]   Tobacco Use    Smoking status: Former     Types: Cigarettes     Start date: 10/05/2003     Quit date: 08/10/2004     Years since quitting: 17.1    Smokeless tobacco: Former    Tobacco comments:     smoked only for 6 months.   Substance and Sexual Activity    Alcohol use: Yes     Comment: minimal    Drug use: No    Sexual activity: Yes     Partners: Male     Birth control/protection: Surgical     Comment: men age 81 q 31, menorrhagia 2006, past history of syphillis and rx , 1st SA age 2, 15 lietime partners, separated again from her husband.   Other Topics Concern    Military Service No    Blood Transfusions No    Caffeine Concern Not Asked    Occupational Exposure No    Hobby Hazards No    Sleep Concern No    Stress Concern No    Weight Concern Yes    Special Diet No    Back Care Not Asked    Exercise No     Comment: not much    Bike Helmet Not Asked    Seat Belt Yes    Self-Exams Not Asked   Social History Narrative    To [place] from [place] in [place]. 3 children, all adults. Husband Jose. Housekeeper. Prior [facility] patient.   Social  Determinants of Health  Financial Resource Strain: Not on file  Food Insecurity: Not on file  Transportation Needs: Not on file  Physical Activity: Not on file  Stress: Not on file  Social Connections: Not on file  Intimate Partner Violence: Not on file  Housing Stability: Not on file   Allergies: Review of Patient's Allergies indicates:   Hydrocodone-acetami*    Nausea and Vomiting   Pcn [penicillins]           Comment:Got a urine infection after using    Immunizations:   Immunization History   Administered Date(s) Administered    Covid-19 Vaccine [employer] - Purple Cap) 01/05/2020, 01/26/2020    INFLUENZA VIRUS TRI W/PRESV VACCINE 18/> YRS IM (PRIVATE) 08/10/2005, 07/20/2008, 07/09/2009, 06/29/2011    Influenza Virus Quad Presv Free Vacc 6 Mo and Older, IM 09/19/2017, 06/26/2018    Influenza Virus Quad W/Presv Vacc 6 Mo and Older, IM 06/24/2014    PPD 02/01/1997    Td 08/10/2005    Tdap 03/19/2015          Medications:  Prior to Admission Medications   Prescriptions Last Dose Informant Patient Reported? Taking?   Estradiol (ESTROGEL) 0.75 MG/1.25 GM (0.06%) topical gel   No No   Sig: Place 1.25 g onto the skin every 3 (three) days.   FLUoxetine (PROZAC) 20 MG capsule   No No   Sig: Take 1 capsule by mouth in the morning.   Multiple Vitamin (MULTIVITAMIN) TABS   Yes No   Sig: Take 1 tablet by mouth daily.   VITAMIN D-400 10 MCG (400 UNIT) TABS Tablet   No No   Sig: TAKE 1 TABLET BY MOUTH DAILY   albuterol HFA 108 (90 Base) MCG/ACT inhaler   No No   Sig: INHALE 1 PUFF INTO THE LUNGS EVERY 4 HOURS AS NEEDED FOR WHEEZING   atorvastatin (LIPITOR) 40 MG tablet   No No   Sig: Take 1 tablet by mouth daily  fluticasone (FLONASE) 50 MCG/ACT nasal spray   No No   Sig: 2 sprays by Each Nostril route daily   metoprolol (TOPROL-XL) 25 MG 24 hr tablet   No No   Sig: Take 1 tablet by mouth daily   omega-3 acid ethyl esters (LOVAZA) 1 GM capsule   Yes No   Sig: Take 2 g by mouth 2 (two) times daily.       Facility-Administered Medications: None     Physical Exam (ED Bed 14/14-A):   Patient Vitals for the past 99 hrs:   BP Temp Pulse Resp SpO2 Weight   10/11/21 1505 121/78 97.9 F 90 18 96 % 65.8 kg (145 lb)     GENERAL:  No acute distress, non-toxic appearance.   SKIN:  Warm & Dry. No rash, no petechia.  HEAD:  No signs of head trauma. Moist mucous membranes.   NECK:  Normal movement of the neck without obvious discomfort.  CHEST:  Breathing comfortably, in no respiratory distress.   BACK: No midline tenderness.    ABDOMEN:  Soft, nontender, nondistended. No involuntary guarding or rebound.   EXTREMITIES:  Tenderness noted along calf muscles and thigh muscles, no bony tenderness. Normal DP/PT. Slight edema of the right leg compared to left. No obvious deformities.  Warm and well perfused.    NEUROLOGIC:  Alert, speaking in clear sentences, and moving all extremities. Normal gait without ataxia.   PSYCHIATRIC:  Appropriate for age, time of day, and situation.    Medications Given in the ED:  Medications   acetaminophen (TYLENOL) tablet 1,000 mg (has no administration in time range)   ibuprofen (ADVIL) tablet 600 mg (has no administration in time range)    Radiology Studies:  [place]   Lab Results (abnormal results only):  Labs Reviewed - No data to display Other Results (e.g. ECG):  N/A     ED Course and Medical Decision-making:  61 year old female patient with back and leg pain. US obtained, and is negative for DVT. More likely sciatica pain radiating down the leg. No red flag symptoms of fevers/chills, bladder or bowel dysfunction, saddle anesthesia, or neurologic deficits. Appears safe for continued outpatient management.    Patient/family educated on diagnosis(es); she states understanding and agrees with plan of care.  She agrees with this plan and disposition. Reasons to return to the ED were reviewed in detail.    Condition on Discharge: Improved and Stable    Diagnosis/Diagnoses:  Acute leg pain, right  Sciatica  of right side      Olam Idler, MD  Attending Physician  [facility] Department of Emergency Medicine    This Emergency Department patient encounter note was created using voice-recognition software and in real time during the ED visit.

## 2021-10-11 NOTE — Narrator Note (Signed)
Patient Disposition  Patient education for diagnosis, medications, activity, diet and follow-up.  Patient left ED 9:51 PM.  Patient rep received written instructions.    Interpreter to provide instructions: No    Patient belongings with patient: YES    Have all existing LDAs been addressed? N/A    Have all IV infusions been stopped? N/A    Destination: Discharged to home

## 2021-10-11 NOTE — Narrator Note (Signed)
Pt. To ultrasound.

## 2021-10-11 NOTE — Discharge Instructions (Addendum)
You were seen in the emergency department for right leg pain with back pain.  Fortunately, the ultrasound appears okay with no sign of blood clot in the leg.  Instead, this appears to be due to nerve pain or muscle pain that is shooting down the leg from your back.  You may continue to take acetaminophen and ibuprofen, both every 6 hours as needed for pain.  In addition to these, you may also take the prescribed methocarbamol, which is a muscle relaxant.  Follow-up with your primary care doctor.

## 2021-10-11 NOTE — ED Triage Note (Signed)
61 yo f presents to ER ambulatory with daughter c/o R hip pain radiating to R ankle, swelling in R leg and numbness in R toes since Thursday. Pain 9/10.  Denies Trama. Pt reports that this happened to her before.

## 2021-11-02 ENCOUNTER — Ambulatory Visit: Payer: 59 | Attending: Internal Medicine

## 2021-11-02 ENCOUNTER — Other Ambulatory Visit: Payer: Self-pay

## 2021-11-02 DIAGNOSIS — E038 Other specified hypothyroidism: Secondary | ICD-10-CM | POA: Diagnosis present

## 2021-11-02 NOTE — Progress Notes (Signed)
Labs drawn

## 2021-11-03 LAB — THYROID SCREEN TSH REFLEX FT4: THYROID SCREEN TSH REFLEX FT4: 6.75 u[IU]/mL — ABNORMAL HIGH (ref 0.270–4.200)

## 2021-11-03 LAB — FREE THYROXINE: FREE THYROXINE: 0.88 ng/dL — ABNORMAL LOW (ref 0.93–1.70)

## 2021-11-04 ENCOUNTER — Other Ambulatory Visit (HOSPITAL_BASED_OUTPATIENT_CLINIC_OR_DEPARTMENT_OTHER): Payer: Self-pay | Admitting: Internal Medicine

## 2021-11-04 ENCOUNTER — Telehealth (HOSPITAL_BASED_OUTPATIENT_CLINIC_OR_DEPARTMENT_OTHER): Payer: Self-pay

## 2021-11-04 DIAGNOSIS — E038 Other specified hypothyroidism: Secondary | ICD-10-CM

## 2021-11-04 NOTE — Telephone Encounter (Signed)
Left message for patient to call to schedule Televisit with Dr. Patrice Paradise  discuss "thyroid lab results"  Per PCP message

## 2021-11-04 NOTE — Telephone Encounter (Signed)
-----   Message from [name], MD sent at 11/04/2021  3:59 PM EST -----  Loretta Martin,  Please schedule her next Weds  2/8 for TV to discuss "thyroid lab results"  Thanks, Bennye Alm

## 2021-11-04 NOTE — Progress Notes (Signed)
Will set up TV to discuss

## 2021-11-11 ENCOUNTER — Ambulatory Visit: Payer: 59 | Attending: Internal Medicine | Admitting: Internal Medicine

## 2021-11-11 DIAGNOSIS — E038 Other specified hypothyroidism: Secondary | ICD-10-CM | POA: Diagnosis present

## 2021-11-11 MED ORDER — VITAMIN D 25 MCG (1000 UT) PO TABS
1.0000 | ORAL_TABLET | Freq: Every day | ORAL | 12 refills | Status: DC
Start: 2021-11-11 — End: 2021-11-19

## 2021-11-11 MED ORDER — LEVOTHYROXINE SODIUM 25 MCG PO TABS
12.5000 ug | ORAL_TABLET | Freq: Every morning | ORAL | 11 refills | Status: DC
Start: 2021-11-11 — End: 2023-02-07

## 2021-11-11 NOTE — Progress Notes (Signed)
follow up hypothyroidism   We discuss results  Given persistently slight high TSH & slight low free T4 will start with low-dose levothyroxine 12.5 mcg/day  Discussed in detail proper way to use & take it  Will repeat TSH in 6 weeks     20 minutes was spent on the day of the visit on some or all of the following activities:   Preparing to see the patient (eg, review of tests)   Obtaining and/or reviewing separately obtained history   Performing a medically appropriate examination and/or evaluation   Counseling and educating the patient/family/caregiver   Ordering medications, tests, or procedures   Referring and communicating with other health care professionals    Documenting clinical information in the electronic or other health record   Independently interpreting results and  communicating results to the patient/family/caregiver   Care coordination

## 2021-11-19 ENCOUNTER — Encounter (HOSPITAL_BASED_OUTPATIENT_CLINIC_OR_DEPARTMENT_OTHER): Payer: Self-pay | Admitting: Internal Medicine

## 2021-11-19 MED ORDER — VITAMIN D 25 MCG (1000 UT) PO TABS
1.0000 | ORAL_TABLET | Freq: Every day | ORAL | 12 refills | Status: DC
Start: 2021-11-19 — End: 2022-08-09

## 2021-11-19 NOTE — Addendum Note (Signed)
Addended by: Emilie Rutter. on: 11/19/2021 11:06 AM     Modules accepted: Orders

## 2021-11-19 NOTE — Telephone Encounter (Signed)
Please note Vit D Rx is in epic as OTC class.  Rx not received by pharmacy.  Please review and approve

## 2021-11-20 NOTE — Telephone Encounter (Signed)
Refill available at [place]- rx sent on 02.08.23 for one year supply

## 2022-01-13 ENCOUNTER — Other Ambulatory Visit: Payer: Self-pay | Admitting: Internal Medicine

## 2022-01-22 ENCOUNTER — Ambulatory Visit (HOSPITAL_BASED_OUTPATIENT_CLINIC_OR_DEPARTMENT_OTHER): Payer: Self-pay

## 2022-01-22 ENCOUNTER — Ambulatory Visit
Payer: 59 | Attending: Student in an Organized Health Care Education/Training Program | Admitting: Student in an Organized Health Care Education/Training Program

## 2022-01-22 ENCOUNTER — Other Ambulatory Visit: Payer: Self-pay

## 2022-01-22 ENCOUNTER — Encounter (HOSPITAL_BASED_OUTPATIENT_CLINIC_OR_DEPARTMENT_OTHER): Payer: Self-pay | Admitting: Student in an Organized Health Care Education/Training Program

## 2022-01-22 VITALS — BP 123/83 | HR 86 | Temp 98.2°F | Ht 64.0 in | Wt 149.0 lb

## 2022-01-22 DIAGNOSIS — J4541 Moderate persistent asthma with (acute) exacerbation: Secondary | ICD-10-CM

## 2022-01-22 DIAGNOSIS — J301 Allergic rhinitis due to pollen: Secondary | ICD-10-CM | POA: Diagnosis present

## 2022-01-22 MED ORDER — CETIRIZINE HCL 10 MG PO TABS
10.00 mg | ORAL_TABLET | Freq: Every day | ORAL | 2 refills | Status: AC
Start: 2022-01-22 — End: 2022-04-22

## 2022-01-22 MED ORDER — ALBUTEROL SULFATE HFA 108 (90 BASE) MCG/ACT IN AERS
2.0000 | INHALATION_SPRAY | RESPIRATORY_TRACT | 3 refills | Status: DC | PRN
Start: 2022-01-22 — End: 2023-02-07

## 2022-01-22 MED ORDER — PREDNISONE 20 MG PO TABS
40.00 mg | ORAL_TABLET | Freq: Every day | ORAL | 0 refills | Status: AC
Start: 2022-01-22 — End: 2022-01-27

## 2022-01-22 NOTE — Progress Notes (Signed)
SUBJECTIVE:    Loretta Martin a 61 year old female patient of Loretta Pound, MD presents for wheezing    Hx MDD, seasonal allergic rhinitis, subclinical hypothyroidism    HPI:  #Wheezing, cough  Worsening wheezing and nighttime cough over last 3-4 days  Some sneezing, runny nose  Reports hx asthma, (rx'ed for suspected RAD, no PFTs on file), has been using albuterol at home every 6 hours without relief  Endorses dry cough, wheeze, shortness of breath  Not sleeping well past few days, increased cough at night  No fever, occ chills  No history of hospitalizations for asthma, no prior intubations  Using flonase, has antihistamine as needed    Previously diagnosed with "allergic asthma"    REVIEW OF SYSTEMS: negative or noted above    Current Outpatient Medications   Medication Sig   ? budesonide-formoterol (SYMBICORT) 80-4.5 MCG/ACT inhaler Use 1-2 puffs as needed for shortness up breath, up to 3 times a day.   ? predniSONE (DELTASONE) 20 MG tablet Take 2 tablets by mouth in the morning for 5 days.   ? albuterol HFA 108 (90 Base) MCG/ACT inhaler Inhale 2 puffs into the lungs every 4 (four) hours as needed for Wheezing   ? cetirizine (ZYRTEC) 10 MG tablet Take 1 tablet by mouth in the morning.   ? cholecalciferol (VITAMIN D3) 25 MCG (1000 UT) tablet Take 1 tablet by mouth in the morning.   ? levothyroxine (SYNTHROID) 25 MCG tablet Take 0.5 tablets by mouth every morning before breakfast   ? FLUoxetine (PROZAC) 20 MG capsule Take 1 capsule by mouth in the morning.   ? atorvastatin (LIPITOR) 40 MG tablet Take 1 tablet by mouth daily   ? metoprolol (TOPROL-XL) 25 MG 24 hr tablet Take 1 tablet by mouth daily   ? fluticasone (FLONASE) 50 MCG/ACT nasal spray 2 sprays by Each Nostril route daily   ? Multiple Vitamin (MULTIVITAMIN) TABS Take 1 tablet by mouth daily.   ? omega-3 acid ethyl esters (LOVAZA) 1 GM capsule Take 2 g by mouth 2 (two) times daily.     No current facility-administered medications for this visit.        OBJECTIVE:  BP 123/83 (Site: LA, Position: Sitting, Cuff Size: Reg)   Pulse 86   Temp 98.2 ?F (36.8 ?C) (Temporal)   Ht '5\' 4"'$  (1.626 m)   Wt 67.6 kg (149 lb)   LMP 11/02/2009   SpO2 94%   BMI 25.58 kg/m?   Gen: Well-appearing, speaking normally  HEENT: Head atraumatic, moist mucous membranes, normal conjunctiva  CV: regular rate and rhythm, no murmurs/rubs/gallops, no pitting edema of lower extremities  Pulm: No increased work of breathing, lungs with decreased air movement throughout; improved after albuterol inhalation; no significant wheezing on my exam, no rales/rhonchi  Abd: normoactive bowel sounds, no tenderness to palpation  Skin: warm and well perfused  Neuro: Alert and oriented, no gross focal motor deficits  Psych: Normal speech and thought process    LABS/IMAGING:    ASSESSMENT AND PLAN:  Problem List Items Addressed This Visit        ENT    Seasonal allergic rhinitis    Relevant Medications    albuterol HFA 108 (90 Base) MCG/ACT inhaler   Other Visit Diagnoses     Moderate persistent reactive airway disease with acute exacerbation        Relevant Orders    HC AEROCHAMBER (Completed)        Acute worsening wheeze  and dry nighttime cough c/w exacerbation of reactive airway disease; unclear if allergic vs viral trigger; spO2 94% in clinic without increased WOB; reports severe nighttime cough preventing sleep. Will rx 5 days of PO pred; gave spacer and albuterol inhaler teaching; discussed return precautions; likely would benefit from PFTs, possible steroid controller in future, encouraged fu w PCP at home clinic.    Return precautions discussed with patient. Patient verbalizes understanding and is in agreement with plan.      Judithe Modest, MD  Family Medicine

## 2022-01-22 NOTE — Telephone Encounter (Signed)
Regarding: Persistent cough  ----- Message from Dorris Singh sent at 01/22/2022 10:10 AM EDT -----  Loretta Martin [id number], 61 year old, female    Calls today:  Sick    What are the symptoms persistent cough since Monday 01/18/22.  How long has patient been sick?   What has pt. tried at home       Person calling on behalf of patient: Patient (self)    CALL BACK NUMBER: [phone]  Best time to call back:   Cell phone:   Other phone:    Patient's language of care: English    Patient does not need an interpreter.    Patient's PCP: [name], MD    Primary Care Home Site:  [facility]

## 2022-01-22 NOTE — Patient Instructions (Signed)
Use the albuterol 2 puffs every 4 hours as needed  Take the steroids for 5 days  Start taking the zyrtec daily  If you develop difficulty breathing, fever, chest pain, or other worrisome symptoms over the weekend, go to the nearest emergency room.  If your symptoms haven't improved by Monday, give [place] a call

## 2022-01-22 NOTE — Telephone Encounter (Signed)
Reason for Disposition  ? Wheezing is present    Answer Assessment - Initial Assessment Questions  1. ONSET:      4/17    2. SEVERITY:       Denies coughing spell  s  3. RESPIRATORY DISTRESS:      Pt able to speak in full complete senteces w/o pause for breath    4. FEVER:       No     5. HEMOPTYSIS:      No       7. CARDIAC HISTORY:      No     8. LUNG HISTORY:       Asthma     10. OTHER SYMPTOMS:         Wheezing    Protocols used: ADULT COUGH - ACUTE NON-PRODUCTIVE-A-AH  Priority: Urgent    Advised per nursing triage protocol.  Verbalized understanding and agreement with instructions and disposition.     Recommended disposition for patient:Disposition: See in Office Today--appt scheduled via [facility] cross book    Instructed patient to call back for any new, worsening, or worrisome symptoms or concerns any time day or night.

## 2022-01-23 ENCOUNTER — Other Ambulatory Visit (HOSPITAL_BASED_OUTPATIENT_CLINIC_OR_DEPARTMENT_OTHER): Payer: Self-pay | Admitting: Student in an Organized Health Care Education/Training Program

## 2022-01-24 NOTE — Telephone Encounter (Signed)
PER Pharmacy, Loretta Martin is a 61 year old female has requested a refill of                -  prednisone.      Last Office Visit: 01/22/2022 with Vena Rua, A  Last Physical Exam: 12/11/2018    COLONOSCOPY due on 07/31/2019  PAP SMEAR due on 01/27/2020  HPV SCREENING due on 01/27/2020    Other Med Adult:  Most Recent BP Reading(s)  01/22/22 : 123/83        Cholesterol (mg/dL)   Date Value   01/25/2020 255 (*H)     LOW DENSITY LIPOPROTEIN DIRECT (mg/dL)   Date Value   01/25/2020 174     HIGH DENSITY LIPOPROTEIN (mg/dL)   Date Value   01/25/2020 60     TRIGLYCERIDES (mg/dL)   Date Value   01/25/2020 60         THYROID SCREEN TSH REFLEX FT4 (uIU/mL)   Date Value   11/02/2021 6.750 (H)         TSH (THYROID STIM HORMONE) (uIU/mL)   Date Value   05/26/2016 8.300 (H)       HEMOGLOBIN A1C (%)   Date Value   01/16/2020 5.3       No results found for: POCA1C      INR (no units)   Date Value   01/25/2020 1.1   05/12/2009 1.0 (L)       SODIUM (mmol/L)   Date Value   03/14/2021 138       POTASSIUM (mmol/L)   Date Value   03/14/2021 4.1           CREATININE (mg/dL)   Date Value   03/14/2021 0.7       Documented patient preferred pharmacies:    [facility] [place], [place] - [address] AT .  Phone: [phone] Fax: [phone]

## 2022-01-25 ENCOUNTER — Other Ambulatory Visit: Payer: Self-pay

## 2022-01-25 ENCOUNTER — Ambulatory Visit: Payer: 59 | Attending: Internal Medicine | Admitting: Internal Medicine

## 2022-01-25 VITALS — BP 149/86 | HR 80 | Temp 96.3°F | Ht 62.21 in | Wt 147.0 lb

## 2022-01-25 DIAGNOSIS — E038 Other specified hypothyroidism: Secondary | ICD-10-CM | POA: Diagnosis present

## 2022-01-25 DIAGNOSIS — M545 Low back pain, unspecified: Secondary | ICD-10-CM | POA: Insufficient documentation

## 2022-01-25 DIAGNOSIS — G8929 Other chronic pain: Secondary | ICD-10-CM | POA: Insufficient documentation

## 2022-01-25 DIAGNOSIS — J4541 Moderate persistent asthma with (acute) exacerbation: Secondary | ICD-10-CM

## 2022-01-25 DIAGNOSIS — J301 Allergic rhinitis due to pollen: Secondary | ICD-10-CM | POA: Diagnosis present

## 2022-01-25 MED ORDER — BUDESONIDE-FORMOTEROL FUMARATE 80-4.5 MCG/ACT IN AERO
INHALATION_SPRAY | RESPIRATORY_TRACT | 11 refills | Status: DC
Start: 2022-01-25 — End: 2022-08-09

## 2022-01-25 NOTE — Progress Notes (Signed)
[facility]    CC: asthma    HPI: Loretta Martin is a 61 year old patient who presents to clinic for:    - has urgent care doctor last week with asthma exacerbation. Given prednisone burst, albuterol prn, zyrtec.   - breathing is better, started to cough up the phlegm.   - dx with asthma related to allergies. With seasonal allergies, or every time she gets a cold.   - never had PFTs, dx by a prior PCP.     Her PCP is here, were checking on thyroid, level was not at goal, started on 26mg 1/2 tab, but was never at pharmacy, so never took it.     - would like vit D checked also  - having a lot of back pain, wondering if due to lack of vitamin  - pain is very low back, both sides, every day  - worse with more work, works as hElectrical engineer - uses icy hot, massage (expensive)  - some radiation into the legs  - doesn't wake her up from sleep  - no fevers, but does sometimes have sweats at night      Review of systems:   Negative except as above and for:      Medications, Allergies, Problem list, and Social history were all reviewed and updated in Epic as appropriate.       Physical Exam:  BP 149/86 (Site: LA, Position: Sitting, Cuff Size: Reg)   Pulse 80   Temp 96.3 ?F (35.7 ?C) (Temporal)   Ht 5' 2.21" (1.58 m)   Wt 66.7 kg (147 lb)   LMP 11/02/2009   SpO2 96%   BMI 26.71 kg/m?     Gen: well appearing, in no distress  Psych: mood is good, affect is bright with normal range, speech fluent and coherent  HEENT: PERRL, EOMI, no icterus. TMs clear, throat non-erythematous and without exudate.   NECK: no palpable lymph nodes, no thyroid enlargement or masses,   CV: RRR, no murmurs  Lung: good air movement bilaterally, no wheezes, crackles or ronchi.  Abd: soft, non-tender, non-distended  Ext: 2+ pedal pulses bilaterally, no lower extremity edema  Neuro: strength and sensation grossly intact bilaterally. Gait is narrow-based and stable.  Skin: no obvious rashes or lesions    Previous records/data  reviewed:  - TSH was high (6ish), T4 low (0.8ish) several months apart. Never had antibody checked.     ASSESSMENT/PLAN:  (J45.41) Moderate persistent reactive airway disease with acute exacerbation  (primary encounter diagnosis)  Comment: exacerbation improved on steroids. Given flairs getting more frequent, PFTs would be helpful. Also given likely asthma, should be using combo ICS-LABA for rescue in the future. This was sent.   Plan: budesonide-formoterol (SYMBICORT) 80-4.5         MCG/ACT inhaler, FULL PULMONARY FUNCTION TEST            (E03.8) Subclinical hypothyroidism  Comment: never started on meds, will check one more time before starting, given TSH was never ery high.   Plan: THYROID SCREEN TSH REFLEX FT4            (M54.50,  G89.29) Chronic midline low back pain without sciatica  Comment: only red flag is some sweats, otherwise sounds like typical strain. Handout on exercises given, check a few labs  Plan: VITAMIN D,25 HYDROXY, RBC SEDIMENTATION RATE,         CBC WITH PLATELET            (  J30.1) Seasonal allergic rhinitis due to pollen  Comment: continue H1 blocker  Plan:         The patient was ready to learn and no apparent learning or adherence barriers were identified. I explained the diagnosis and treatment plan, and the patient expressed understanding of the content. I attempted to answer any questions regarding the diagnosis and the proposed treatment.     We discussed the patient?s current medications.  We discussed the importance of medication compliance. The patient expressed understanding and no barriers to adherence were identified.    she has been advised to call or return with any worsening or new problems          Loretta Dice, MD

## 2022-01-28 ENCOUNTER — Other Ambulatory Visit (HOSPITAL_BASED_OUTPATIENT_CLINIC_OR_DEPARTMENT_OTHER): Payer: 59

## 2022-03-08 ENCOUNTER — Ambulatory Visit: Payer: 59 | Attending: Internal Medicine

## 2022-03-08 ENCOUNTER — Other Ambulatory Visit: Payer: Self-pay

## 2022-03-08 ENCOUNTER — Encounter (HOSPITAL_BASED_OUTPATIENT_CLINIC_OR_DEPARTMENT_OTHER): Payer: 59 | Admitting: Internal Medicine

## 2022-03-08 DIAGNOSIS — E038 Other specified hypothyroidism: Secondary | ICD-10-CM | POA: Diagnosis present

## 2022-03-08 DIAGNOSIS — G8929 Other chronic pain: Secondary | ICD-10-CM | POA: Insufficient documentation

## 2022-03-08 DIAGNOSIS — M545 Low back pain, unspecified: Secondary | ICD-10-CM | POA: Diagnosis present

## 2022-03-08 LAB — CBC WITH PLATELET
ABSOLUTE NRBC COUNT: 0 10*3/uL (ref 0.0–0.0)
HEMATOCRIT: 41.9 % (ref 34.1–44.9)
HEMOGLOBIN: 13.6 g/dL (ref 11.2–15.7)
MEAN CORP HGB CONC: 32.5 g/dL (ref 31.0–37.0)
MEAN CORPUSCULAR HGB: 31.3 pg (ref 26.0–34.0)
MEAN CORPUSCULAR VOL: 96.5 fl (ref 80.0–100.0)
MEAN PLATELET VOLUME: 10.3 fL (ref 8.7–12.5)
NRBC %: 0 % (ref 0.0–0.0)
PLATELET COUNT: 213 10*3/uL (ref 150–400)
RBC DISTRIBUTION WIDTH STD DEV: 44 fL (ref 35.1–46.3)
RED BLOOD CELL COUNT: 4.34 M/uL (ref 3.90–5.20)
WHITE BLOOD CELL COUNT: 5.6 10*3/uL (ref 4.0–11.0)

## 2022-03-08 LAB — RBC SEDIMENTATION RATE: RBC SEDIMENTATION RATE: 1 MM/HR (ref 0–30)

## 2022-03-09 LAB — VITAMIN D,25 HYDROXY: VITAMIN D,25 HYDROXY: 35 ng/mL (ref 30.0–100.0)

## 2022-03-09 LAB — THYROID SCREEN TSH REFLEX FT4: THYROID SCREEN TSH REFLEX FT4: 7.13 u[IU]/mL — ABNORMAL HIGH (ref 0.270–4.200)

## 2022-03-09 LAB — FREE THYROXINE: FREE THYROXINE: 0.85 ng/dL — ABNORMAL LOW (ref 0.93–1.70)

## 2022-03-10 ENCOUNTER — Encounter (HOSPITAL_BASED_OUTPATIENT_CLINIC_OR_DEPARTMENT_OTHER): Payer: Self-pay | Admitting: Internal Medicine

## 2022-03-15 ENCOUNTER — Ambulatory Visit: Payer: 59 | Attending: Internal Medicine | Admitting: Internal Medicine

## 2022-03-15 DIAGNOSIS — E038 Other specified hypothyroidism: Secondary | ICD-10-CM | POA: Diagnosis present

## 2022-03-15 NOTE — Progress Notes (Signed)
follow up hypothyroidism   She started taking the 12.5 mcg of levothyroxine only a few days ago  Most recent TSH 7, free T4 slightly low    No symptoms of hypo or hyperthyroidism: no decreased or increased weight, no feeling cold/chilly or excessively warm, no diarrhea or constipation, no undue sweatiness, anxiety or palpitations.     We discussed that she'll need to take this every day & that we'll need to repeat blood work in 6 weeks to see if the dose is correct for her - she agrees with the plan    20 minutes was spent on the day of the visit on some or all of the following activities:  . Preparing to see the patient (eg, review of tests)  . Obtaining and/or reviewing separately obtained history  . Performing a medically appropriate examination and/or evaluation  . Counseling and educating the patient/family/caregiver  . Ordering medications, tests, or procedures  . Referring and communicating with other health care professionals   . Documenting clinical information in the electronic or other health record  . Independently interpreting results and  communicating results to the patient/family/caregiver  . Care coordination

## 2022-03-25 ENCOUNTER — Encounter (HOSPITAL_BASED_OUTPATIENT_CLINIC_OR_DEPARTMENT_OTHER): Payer: Self-pay

## 2022-03-25 ENCOUNTER — Telehealth (HOSPITAL_BASED_OUTPATIENT_CLINIC_OR_DEPARTMENT_OTHER): Payer: Self-pay | Admitting: Internal Medicine

## 2022-03-25 NOTE — Telephone Encounter (Signed)
LVM on ph. [phone] + mychart message  Was sent to pt   see pcp message below   [name], MD  [facility],      Please call to let her know there is a blood test that I have ordered for her and she needs to schedule this in 6 weeks from now and she can get done as lab only visit & now it is important to have this completed. Please help her schedule a lab visit.     Let me know if any issues, thanks, Bennye Alm

## 2022-04-23 NOTE — Telephone Encounter (Signed)
A user error has taken place.

## 2022-05-07 ENCOUNTER — Other Ambulatory Visit: Payer: Self-pay

## 2022-05-07 ENCOUNTER — Ambulatory Visit: Payer: 59 | Attending: Internal Medicine

## 2022-05-07 DIAGNOSIS — E038 Other specified hypothyroidism: Secondary | ICD-10-CM | POA: Insufficient documentation

## 2022-05-07 LAB — FREE THYROXINE: FREE THYROXINE: 0.99 ng/dL (ref 0.93–1.70)

## 2022-05-07 LAB — THYROID SCREEN TSH REFLEX FT4: THYROID SCREEN TSH REFLEX FT4: 6.1 u[IU]/mL — ABNORMAL HIGH (ref 0.270–4.200)

## 2022-05-07 NOTE — Progress Notes (Signed)
Labs drawn.  1 SST

## 2022-05-10 ENCOUNTER — Telehealth (HOSPITAL_BASED_OUTPATIENT_CLINIC_OR_DEPARTMENT_OTHER): Payer: Self-pay | Admitting: Internal Medicine

## 2022-05-10 DIAGNOSIS — E038 Other specified hypothyroidism: Secondary | ICD-10-CM

## 2022-05-10 NOTE — Telephone Encounter (Signed)
Will continue with 12.5 mcg of levothyroxine daily - and then repeat TSH in 6 months  As TSH very slightly elevated on this dose

## 2022-06-18 ENCOUNTER — Encounter (HOSPITAL_BASED_OUTPATIENT_CLINIC_OR_DEPARTMENT_OTHER): Payer: 59 | Admitting: Student in an Organized Health Care Education/Training Program

## 2022-06-23 ENCOUNTER — Telehealth (HOSPITAL_BASED_OUTPATIENT_CLINIC_OR_DEPARTMENT_OTHER): Payer: Self-pay | Admitting: Internal Medicine

## 2022-06-23 ENCOUNTER — Encounter (HOSPITAL_BASED_OUTPATIENT_CLINIC_OR_DEPARTMENT_OTHER): Payer: Self-pay | Admitting: Geriatrics

## 2022-06-23 ENCOUNTER — Encounter (HOSPITAL_BASED_OUTPATIENT_CLINIC_OR_DEPARTMENT_OTHER): Payer: Self-pay | Admitting: Internal Medicine

## 2022-06-23 NOTE — Telephone Encounter (Signed)
Loretta Martin [id number], 61 year old, female    Calls today:  Refill          Medicine Name:   FLUoxetine (PROZAC) 20 MG capsule      Documented patient preferred pharmacies:    [facility] [place], [address] .  Phone: [phone] Fax: [phone]      Person calling on behalf of patient: Patient (self)    CALL BACK NUMBER: [phone]   Best time to call back:   Cell phone:   Other phone:    Patient's language of care: English    Patient does not need an interpreter.    Patient's PCP: [name], MD    Primary Care Home Site:  [facility]

## 2022-06-24 ENCOUNTER — Ambulatory Visit (HOSPITAL_BASED_OUTPATIENT_CLINIC_OR_DEPARTMENT_OTHER): Payer: Self-pay | Admitting: Registered Nurse

## 2022-06-24 MED ORDER — FLUOXETINE HCL 20 MG PO CAPS
20.0000 mg | ORAL_CAPSULE | Freq: Every day | ORAL | 11 refills | Status: DC
Start: 2022-06-24 — End: 2022-08-09

## 2022-06-24 NOTE — Telephone Encounter (Signed)
Reason for Disposition   MODERATE back pain (e.g., interferes with normal activities) and present > 3 days    Answer Assessment - Initial Assessment Questions  Loretta Seltzer, RN, 06/24/2022  Tc to pt for triage of Scheduling request message.  [place] speaking RN confirmed pt's name and DOB.  Pt reports has had a very long hx of sciatica.  Calling now as feels "it's worsening".  Feels feet numb at times.  Able to walk and weight bear but has not consulted PCP re: this issue.  Afeb.  No urinary or other associated sx.  Taking otc pain meds (tylenol/ibuprofen) without much help.    Protocols used: Back Pain-A-OH    Advised per nursing triage protocol.  Verbalized understanding and agreement with instructions and disposition.     Recommended disposition for patient:Disposition: See in Office within 3 days    If patient referred to UC/ED advised that they may require further follow up and testing after the visit with their primary care office.     Instructed patient to call back for any new, worsening, or worrisome symptoms or concerns any time day or night.

## 2022-06-24 NOTE — Telephone Encounter (Signed)
From: Lavonda Jumbo   Sent: 06/19/2022  11:35 AM EDT   To: [facility] Mychart Scheduling   Subject: Appointment Request                                Appointment Request From: Lavonda Jumbo      With Provider: Claria Dice, MD [facility]]      Preferred Date Range: 06/21/2022 - 07/05/2022      Preferred Times: Any Time      Reason for visit: Sciatic nerve and lumbar pain      Comments:   Sciatic nerve shooting up to lumbar spine causing numbness in the feet

## 2022-06-24 NOTE — Telephone Encounter (Signed)
PER Patient (self), Loretta Martin is a 61 year old female has requested a refill of      - FLUoxetine (PROZAC) 20 MG capsule      Last Office Visit: 03/15/22 with pcp  Last Physical Exam: 12/11/18     PAP SMEAR due on 01/27/2020  HPV SCREENING due on 01/27/2020     Other Med Adult:  Most Recent BP Reading(s)  01/25/22 : 149/86        Cholesterol (mg/dL)   Date Value   01/25/2020 255 (*H)     LOW DENSITY LIPOPROTEIN DIRECT (mg/dL)   Date Value   01/25/2020 174     HIGH DENSITY LIPOPROTEIN (mg/dL)   Date Value   01/25/2020 60     TRIGLYCERIDES (mg/dL)   Date Value   01/25/2020 60         THYROID SCREEN TSH REFLEX FT4 (uIU/mL)   Date Value   05/07/2022 6.100 (H)         TSH (THYROID STIM HORMONE) (uIU/mL)   Date Value   05/26/2016 8.300 (H)       HEMOGLOBIN A1C (%)   Date Value   01/16/2020 5.3       No results found for: POCA1C      INR (no units)   Date Value   01/25/2020 1.1   05/12/2009 1.0 (L)       SODIUM (mmol/L)   Date Value   03/14/2021 138       POTASSIUM (mmol/L)   Date Value   03/14/2021 4.1           CREATININE (mg/dL)   Date Value   03/14/2021 0.7        Documented patient preferred pharmacies:    [facility] [place], [address] .  Phone: [phone] Fax: [phone]

## 2022-06-25 ENCOUNTER — Encounter (HOSPITAL_BASED_OUTPATIENT_CLINIC_OR_DEPARTMENT_OTHER): Payer: Self-pay | Admitting: Student in an Organized Health Care Education/Training Program

## 2022-06-25 ENCOUNTER — Ambulatory Visit: Payer: 59 | Attending: Family Medicine | Admitting: Student in an Organized Health Care Education/Training Program

## 2022-06-25 ENCOUNTER — Other Ambulatory Visit: Payer: Self-pay

## 2022-06-25 VITALS — BP 108/65 | HR 88 | Temp 96.6°F | Ht 62.0 in | Wt 147.0 lb

## 2022-06-25 DIAGNOSIS — M5441 Lumbago with sciatica, right side: Secondary | ICD-10-CM | POA: Diagnosis not present

## 2022-06-25 DIAGNOSIS — L84 Corns and callosities: Secondary | ICD-10-CM | POA: Diagnosis not present

## 2022-06-25 MED ORDER — SALICYLIC ACID 40 % EX PADS
1.0000 | MEDICATED_PAD | Freq: Every evening | CUTANEOUS | 1 refills | Status: AC
Start: 2022-06-25 — End: 2022-07-25

## 2022-06-25 MED ORDER — CYCLOBENZAPRINE HCL 5 MG PO TABS
5.00 mg | ORAL_TABLET | Freq: Two times a day (BID) | ORAL | 0 refills | Status: AC | PRN
Start: 2022-06-25 — End: 2022-07-25

## 2022-06-25 MED ORDER — IBUPROFEN 600 MG PO TABS
600.00 mg | ORAL_TABLET | Freq: Four times a day (QID) | ORAL | 1 refills | Status: AC | PRN
Start: 2022-06-25 — End: 2022-07-25

## 2022-06-25 NOTE — Progress Notes (Signed)
[facility]   Primary Care Office Visit    Interpretor: no    Subjective:   Loretta Martin 61 year old female presents today to address the following concerns:     # back pain shooting down leg  - Pt reports has had a very long hx of sciatica but "it's worsening" over the past month.  - pain shoots down the right leg and foot feels numb at times.  - she cannot stand for long   - sometimes the left but mostly the right   - No urinary sx, difficulty stooling or saddle anesthesia   - Taking otc pain meds (tylenol) without much help.   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      Medications, Allergies, Problem list, and Social history were all reviewed and updated in Epic as appropriate.      Physical Exam:  BP 108/65 (Site: LA, Position: Sitting, Cuff Size: Reg)   Pulse 88   Temp 96.6 F (35.9 C)   Ht '5\' 2"'$  (1.575 m)   Wt 66.7 kg (147 lb)   LMP 11/02/2009   SpO2 95%   BMI 26.89 kg/m   Pain Score: Data Unavailable    Gen: well appearing, in no distress  Psych: mood is good, affect is bright with normal range, speech fluent and coherent  HEENT: PERRL, EOMI, no icterus. No carotid bruits or JVD.   NECK: no palpable lymph nodes, no thyroid enlargement or masses,   CV: RRR, no murmurs  Lung: good air movement bilaterally, no wheezes, crackles or ronchi.  MUSCULOSKELETAL:  No deformities. Well-perfused extremities with  2+ DP/PT/Rad pulses bilaterally. No cyanosis or edema. Forward fold makes pain: No Change  Back extension makes pain: Worse. Mild tenderness to palpation of SI joints.  No skin changes. normal gait. Good motion of the hips. Lumbosacral spine without point tenderness or masses. Straight leg raise  causes shooting pain down posterior right leg. NEUROLOGIC:  Normal DTRs bilateral lower extremities. Normal sensation bilateral lower extremities. Strength 5/5.   Feet: hyperkeratotic lesion on medial 5th right toe, no ulcerations      ASSESSMENT/PLAN:  (M54.41) Acute right-sided low back pain with right-sided sciatica  (primary encounter diagnosis)  Comment: We reviewed that the recommended treatment for low back pain is an exercise program to strengthen and stretch the muscles around the back, abdomen, and legs. She is in agreement. We discussed trialing home exercises vs. Doing a regimented PT program and she would like to pursue PT. I have printed exercises to begin in the interim while awaiting appointment. I have provided a muscle relaxer and anti-inflammatory to help with acute symptoms.   Plan: cyclobenzaprine (FLEXERIL) 5 MG tablet,         ibuprofen (ADVIL) 600 MG tablet, REFERRAL TO         PHYSICAL THERAPY (INT)    (L84) Callus between toes  Comment: Discussed pumice stone skin shaving followed by sal acid treatment to assist with lesions. We also reviewed shoe choices and ensuring there is not areas of friction and toes are  not constricted. Sal acid is OTC and may not be covered by insurance but will try to send.   Plan: Salicylic Acid 40 % PADS    Possible side effects of the new prescribed medication were explained, including: drowsiness with the flexeril- she was advised not to drive while taking and only take at night time until she knows how her body responds. We also reviewed increased risk of GI ulcer with ibuprofen and prozac combined use. Alert office immediately if having stomach pains, hemoptysis, dark or bloody stools. We discussed the patient's current medications.  We discussed the importance of medication compliance.     The patient expressed understanding and no barriers to adherence were identified.    The patient was ready to learn and no apparent learning or adherence barriers were  identified. I explained the diagnosis and treatment plan, and the patient expressed understanding of the content. I attempted to answer any questions regarding the diagnosis and the proposed treatment.    [address], PA-C, 06/25/2022

## 2022-06-25 NOTE — Telephone Encounter (Signed)
PATIENT STILL HAVE REFILLS AT THE PHARMACY.

## 2022-07-14 ENCOUNTER — Ambulatory Visit (HOSPITAL_BASED_OUTPATIENT_CLINIC_OR_DEPARTMENT_OTHER): Payer: Self-pay | Admitting: Registered Nurse

## 2022-07-14 ENCOUNTER — Encounter (HOSPITAL_BASED_OUTPATIENT_CLINIC_OR_DEPARTMENT_OTHER): Payer: Self-pay | Admitting: Registered Nurse

## 2022-07-14 NOTE — Telephone Encounter (Signed)
FW: Appointment Request   [name], [name]   Sent: Wed July 14, 2022 12:41 PM   To: [facility] Nurse Karen Kitchens D. [place]   MRN: [id number] DOB: [birth year]   Pt Home: [phone]     Entered: [phone]        Message    Please advise.thank you   ----- Message -----   From: Loretta Martin   Sent: 07/09/2022   5:01 PM EDT   To: [facility] Mychart Scheduling   Subject: Appointment Request                                Appointment Request From: Loretta Martin      With Provider: Claria Dice, MD [facility]]      Preferred Date Range: 07/09/2022 - 07/30/2022      Preferred Times: Any Time      Reason for visit: Pain and numbness of toes on rt. foot      Comments:   Pain & numbness of toes on rt. foot. Burning sensation Difficulty walking. Causing blisters     Excell Seltzer, RN, 07/14/2022  TC to pt x 2.  No answer.  LM on VM in [place] to return call to clinic and ask for Triage Nurse Monday - Friday 8am-5pm.  MyChart message also sent to pt.  Await her response.

## 2022-07-15 ENCOUNTER — Emergency Department
Admission: EM | Admit: 2022-07-15 | Discharge: 2022-07-15 | Disposition: A | Discharge status: Home / Self Care | Payer: 59 | Attending: Emergency Medicine | Admitting: Emergency Medicine

## 2022-07-15 DIAGNOSIS — R109 Unspecified abdominal pain: Secondary | ICD-10-CM | POA: Diagnosis present

## 2022-07-15 DIAGNOSIS — R197 Diarrhea, unspecified: Secondary | ICD-10-CM | POA: Insufficient documentation

## 2022-07-15 LAB — HEPATIC FUNCTION PANEL
ALANINE AMINOTRANSFERASE: 22 U/L (ref 12–45)
ALBUMIN: 4.4 g/dL (ref 3.4–5.2)
ALKALINE PHOSPHATASE: 92 U/L (ref 45–117)
ASPARTATE AMINOTRANSFERASE: 24 U/L (ref 8–34)
BILIRUBIN DIRECT: <0.2 mg/dL (ref 0.0–0.2)
BILIRUBIN TOTAL: 0.3 mg/dL (ref 0.2–1.0)
TOTAL PROTEIN: 7.4 g/dL (ref 6.4–8.2)

## 2022-07-15 LAB — CBC, PLATELET & DIFFERENTIAL
ABSOLUTE BASO COUNT: 0 10*3/uL (ref 0.0–0.1)
ABSOLUTE EOSINOPHIL COUNT: 0.1 10*3/uL (ref 0.0–0.8)
ABSOLUTE IMM GRAN COUNT: 0.01 10*3/uL (ref 0.00–0.10)
ABSOLUTE LYMPH COUNT: 2.5 10*3/uL (ref 0.6–5.9)
ABSOLUTE MONO COUNT: 0.7 10*3/uL (ref 0.2–1.4)
ABSOLUTE NEUTROPHIL COUNT: 4.3 10*3/uL (ref 1.6–8.3)
ABSOLUTE NRBC COUNT: 0 10*3/uL (ref 0.0–0.0)
BASOPHIL %: 0.5 % (ref 0.0–1.2)
EOSINOPHIL %: 1.7 % (ref 0.0–7.0)
HEMATOCRIT: 39.3 % (ref 34.1–44.9)
HEMOGLOBIN: 13.2 g/dL (ref 11.2–15.7)
IMMATURE GRANULOCYTE %: 0.1 % (ref 0.0–1.0)
LYMPHOCYTE %: 32.4 % (ref 15.0–54.0)
MEAN CORP HGB CONC: 33.6 g/dL (ref 31.0–37.0)
MEAN CORPUSCULAR HGB: 31.3 pg (ref 26.0–34.0)
MEAN CORPUSCULAR VOL: 93.1 fl (ref 80.0–100.0)
MEAN PLATELET VOLUME: 9.4 fL (ref 8.7–12.5)
MONOCYTE %: 9 % (ref 4.0–13.0)
NEUTROPHIL %: 56.3 % (ref 40.0–75.0)
NRBC %: 0 % (ref 0.0–0.0)
PLATELET COUNT: 212 10*3/uL (ref 150–400)
RBC DISTRIBUTION WIDTH STD DEV: 42.3 fL (ref 35.1–46.3)
RED BLOOD CELL COUNT: 4.22 M/uL (ref 3.90–5.20)
WHITE BLOOD CELL COUNT: 7.6 10*3/uL (ref 4.0–11.0)

## 2022-07-15 LAB — BASIC METABOLIC PANEL
ANION GAP: 15 mmol/L (ref 10–22)
BUN (UREA NITROGEN): 17 mg/dL (ref 7–18)
CALCIUM: 9.8 mg/dL (ref 8.5–10.5)
CARBON DIOXIDE: 27 mmol/L (ref 21–32)
CHLORIDE: 101 mmol/L (ref 98–107)
CREATININE: 0.8 mg/dL (ref 0.4–1.2)
ESTIMATED GLOMERULAR FILT RATE: >60 mL/min (ref >60)
Glucose Random: 144 mg/dL (ref 74–160)
POTASSIUM: 4.5 mmol/L (ref 3.5–5.1)
SODIUM: 143 mmol/L (ref 136–145)

## 2022-07-15 LAB — LIPASE: LIPASE: 28 U/L (ref 13–60)

## 2022-07-15 MED ORDER — FAMOTIDINE 20 MG PO TABS
20.00 mg | ORAL_TABLET | Freq: Two times a day (BID) | ORAL | 0 refills | Status: AC
Start: 2022-07-15 — End: 2022-07-25

## 2022-07-15 MED ORDER — ONDANSETRON 4 MG PO TBDP
4.00 mg | ORAL_TABLET | Freq: Three times a day (TID) | ORAL | 0 refills | Status: AC | PRN
Start: 2022-07-15 — End: 2022-07-20

## 2022-07-15 MED ORDER — FAMOTIDINE 20 MG PO TABS
20.0000 mg | ORAL_TABLET | Freq: Once | ORAL | Status: AC
Start: 2022-07-15 — End: 2022-07-15
  Administered 2022-07-15: 20 mg via ORAL
  Filled 2022-07-15: qty 1

## 2022-07-15 MED ORDER — ONDANSETRON HCL 4 MG/2ML IJ SOLN
4.0000 mg | Freq: Once | INTRAMUSCULAR | Status: AC
Start: 2022-07-15 — End: 2022-07-15
  Administered 2022-07-15: 4 mg via INTRAVENOUS
  Filled 2022-07-15: qty 2

## 2022-07-15 NOTE — ED Triage Note (Signed)
[place] ambulatory to the ED with reports of diarrhea, states her body feels "hot"    Pt states she started having diarrhea at 10pm, has gone 4 times. Pt denies vomiting, reports mild nausea.

## 2022-07-15 NOTE — Narrator Note (Signed)
Patient Disposition  Patient education for diagnosis, medications, activity, diet and follow-up.  Patient left ED 2:55 AM.  Patient rep received written instructions.    Interpreter to provide instructions: No    Patient belongings with patient: YES    Have all existing LDAs been addressed? Yes    Have all IV infusions been stopped? Yes    Destination: Discharged to home. Pt verbalizes understanding of d/c instructions, reasons to return. Left with all belongings.

## 2022-07-15 NOTE — Discharge Instructions (Addendum)
Your blood work was unremarkable today.  Continuing the Pepcid morning and night for pain.  You can use Zofran as needed for nausea.  Try to do a clear diet for the next 24 hours.  As you start to feel better can add more solids.  Follow-up with primary care doctor if symptoms persist.  Return to the ER for worsening symptom

## 2022-07-15 NOTE — ED Provider Notes (Signed)
The patient was seen primarily by me. ED nursing record was reviewed. Prior records as available electronically through the Epic record were reviewed.    HPI:    This is a 61 year old female patient complaining of abdominal pain nausea and diarrhea that started this evening.  Patient reports that she did eat dinner had salad, rice sausage and soup.  Patient reports a history of a C-section in the past and abdominal plasty in 2004.  Patient reports no pain with urination.  Patient reports no vomiting.  Patient reports the pain is diffuse.          ROS: Pertinent positives were reviewed as per the HPI above. All other systems were reviewed and are negative.      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      Comment:  treated [facility] 11/97 CSF VDRL neg 9/00 RPR 9/00 1:2 repeat                RPR yearly   08/31/2004: Nonspecific abnormal results of thyroid function study      Comment:  increased TSH 4.19 6/00; TSH nl 9/00; 05/04/99 - wnl  04/23/2009: Presbyopia  04/23/2009: PVD (posterior vitreous detachment)  08/31/2004: Trichomoniasis, unspecified      Comment:  10/97  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      Comment:  +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 - had negative CXR pp at [facility] referred to TB               clinic  08/31/2004: Urinary tract infection, site not specified      Comment:  4/02, 6/02  Patient Active Problem List:     Nonspecific reaction to tuberculin skin test without active tuberculosis     Major depressive disorder, recurrent, in remission ([place])     Mammographic microcalcification, normal BX     PVD (posterior vitreous detachment)     Asymptomatic neurocysticercosis     Female stress incontinence     Family history of diabetes mellitus type II     Seasonal allergic rhinitis     BMI  26.0-26.9,adult     Varicose veins of lower extremity     Subclinical hypothyroidism     Elevated troponin     Dizzy        Past Surgical History: Past Surgical History:  No date: BREAST BIOPSY  No date: BREAST BIOPSY & NEEDLE LOC WIR  No date: EXCISION EXCESSIVE SKIN & SUBQ TISSUE ABDOMEN      Comment:  abdominoplasty in [place]  1998: LIG/TRNSXJ FLP TUBE ABDL/VAG APPR UNI/BI      Comment:  Tubal Ligation  No date: OB ANTEPARTUM CARE CESAREAN DLVR & POSTPARTUM      Comment:  c/section x 2  2004: REDUCTION MAMMAPLASTY  No date: TRURL RF FEMALE BLADDER NECK STRS URIN INCONT      Medications:   Current Facility-Administered Medications   Medication    famotidine (PEPCID) tablet 20 mg    ondansetron (ZOFRAN) injection 4 mg     Current Outpatient Medications   Medication Sig    cyclobenzaprine (FLEXERIL) 5 MG tablet Take 1 tablet by mouth 2 (two) times daily as needed    ibuprofen (  ADVIL) 600 MG tablet Take 1 tablet by mouth every 6 (six) hours as needed for Pain    Salicylic Acid 40 % PADS Apply 1 Pad Topically at bedtime    FLUoxetine (PROZAC) 20 MG capsule Take 1 capsule by mouth in the morning.    budesonide-formoterol (SYMBICORT) 80-4.5 MCG/ACT inhaler Use 1-2 puffs as needed for shortness up breath, up to 3 times a day.    albuterol HFA 108 (90 Base) MCG/ACT inhaler Inhale 2 puffs into the lungs every 4 (four) hours as needed for Wheezing    cholecalciferol (VITAMIN D3) 25 MCG (1000 UT) tablet Take 1 tablet by mouth in the morning.    levothyroxine (SYNTHROID) 25 MCG tablet Take 0.5 tablets by mouth every morning before breakfast    atorvastatin (LIPITOR) 40 MG tablet Take 1 tablet by mouth daily    metoprolol (TOPROL-XL) 25 MG 24 hr tablet Take 1 tablet by mouth daily    fluticasone (FLONASE) 50 MCG/ACT nasal spray 2 sprays by Each Nostril route daily    Multiple Vitamin (MULTIVITAMIN) TABS Take 1 tablet by mouth daily.    omega-3 acid ethyl esters (LOVAZA) 1 GM capsule Take 2 g by mouth 2 (two) times daily.          Social History: Social History    Tobacco Use      Smoking status: Former        Types: Cigarettes        Start date: 10/05/2003        Quit date: 08/10/2004        Years since quitting: 17.9      Smokeless tobacco: Former      Tobacco comments: smoked only for 6 months.    Alcohol use: Yes      Comment: minimal        Allergies:  Review of Patient's Allergies indicates:   Hydrocodone-acetami*    Nausea and Vomiting   Pcn [penicillins]           Comment:Got a urine infection after using      Physical Exam:  BP 147/99   Pulse 94   Temp 97.8 F   Resp 16   LMP 11/02/2009   SpO2 98%     GENERAL: No acute distress.   SKIN:  Warm & Dry, no rash.  HEAD: Atraumatic. PERRL. EOMI.  Oropharynx: clear.  NECK: No midline tenderness.  No LAN.   LUNGS:  Clear to auscultation bilaterally. No wheezes, rales, rhonchi.   HEART:  RRR.  No murmurs, rubs, or gallops.   ABDOMEN:  Soft, NTND.  No guarding or rebound tenderness.   MUSCULOSKELETAL:  No obvious deformities.    NEUROLOGIC: Alert and oriented.  Moves all extremities well.  PSYCHIATRIC:  Appropriate for age, time of day, and situation    Labs Reviewed   CBC, PLATELET & DIFFERENTIAL   BASIC METABOLIC PANEL   HEPATIC FUNCTION PANEL   LIPASE         ED Course and Medical Decision-making:    The patient is a  61 year old female with abdominal pain and diarrhea.  Patient's abdominal exam is benign.  Patient has no focal tenderness.  Do not think she requires imaging at this time.  Patient had labs that were unremarkable.  Patient received GI meds with improvement of symptoms.  Patient be discharged Zofran and Pepcid.  Patient can do a clear diet for the 24 hours and add back solids that she start to feel better.  Return for worsening symptoms      Serafina Royals, MD

## 2022-08-09 ENCOUNTER — Encounter (HOSPITAL_BASED_OUTPATIENT_CLINIC_OR_DEPARTMENT_OTHER): Payer: Self-pay | Admitting: Internal Medicine

## 2022-08-09 ENCOUNTER — Ambulatory Visit: Payer: 59 | Attending: Internal Medicine | Admitting: Internal Medicine

## 2022-08-09 ENCOUNTER — Other Ambulatory Visit: Payer: Self-pay

## 2022-08-09 VITALS — BP 107/74 | HR 99 | Temp 97.8°F | Wt 147.0 lb

## 2022-08-09 DIAGNOSIS — Z1211 Encounter for screening for malignant neoplasm of colon: Secondary | ICD-10-CM | POA: Insufficient documentation

## 2022-08-09 DIAGNOSIS — E038 Other specified hypothyroidism: Secondary | ICD-10-CM | POA: Insufficient documentation

## 2022-08-09 DIAGNOSIS — F3340 Major depressive disorder, recurrent, in remission, unspecified: Secondary | ICD-10-CM | POA: Insufficient documentation

## 2022-08-09 DIAGNOSIS — M79671 Pain in right foot: Secondary | ICD-10-CM | POA: Diagnosis present

## 2022-08-09 DIAGNOSIS — Z1212 Encounter for screening for malignant neoplasm of rectum: Secondary | ICD-10-CM | POA: Diagnosis present

## 2022-08-09 LAB — TSH (THYROID STIMULATING HORMONE): TSH (THYROID STIM HORMONE): 4.67 u[IU]/mL — ABNORMAL HIGH (ref 0.270–4.200)

## 2022-08-09 MED ORDER — VITAMIN D 25 MCG (1000 UT) PO TABS
1.00 | ORAL_TABLET | Freq: Every day | ORAL | 12 refills | Status: AC
Start: 2022-08-09 — End: 2023-08-09

## 2022-08-09 MED ORDER — FLUOXETINE HCL 40 MG PO CAPS
40.0000 mg | ORAL_CAPSULE | Freq: Every morning | ORAL | 12 refills | Status: DC
Start: 2022-08-09 — End: 2023-02-07

## 2022-08-09 NOTE — Progress Notes (Signed)
mDD  She notes that she is feeling more down & having more trouble concentrating, sleeping - than before  She also notes that she has some days when she feels very anxious  Not seeing therapist  Taking 20 mg of fluoxetine daily   Please see PHQ-9 for additional details.   She does not report thoughts of death, suicidal thoughts or suicide attempts     right foot pain  She notes that her toes go out to the side & that causes pain when she walks x many months    BP 107/74   Pulse 99   Temp 97.8 F (36.6 C) (Temporal)   Wt 66.7 kg (147 lb)   LMP 11/02/2009   SpO2 99%   BMI 26.89 kg/m   Heart: S1 and S2 normal, no murmurs, clicks, gallops or rubs. Regular rate and rhythm.   Lungs:  clear; no wheezes, rhonchi or rales.  MMSE: well groomed, affect normal, no tangential thoughts, no pressured speech, no agitation or psychomotor slowing, normal response-time to questions  Foot: right 3 small toes bend out to the side       ASSESSMENT & PLAN:  (F33.40) Major depressive disorder, recurrent, in remission ([place])  (primary encounter diagnosis)  Comment: symptoms not well controlled on fluoxetine 20 mg - will increase to 40 mg daily - discussed that this could take a few weeks to have full effect  Will also connect with Eyvonne Mechanic to discuss option of therapist  Plan: TSH (THYROID STIMULATING HORMONE)            (Z12.11,  Z12.12) Encounter for colorectal cancer screening  Comment: We provided the patient with materials to collect the DNA fecal test.  Detailed instructions were provided.  I explained that this is an important test to find early colon cancer that needs to be completed every 3 years if normal.  If abnormal, must do a colonoscopy now.  After we receive the results, if they are normal, will also have option of colonoscopy.   Plan: STOOL DNA            (M79.671) Right foot pain  Comment: will refer  Plan: REFERRAL TO PODIATRY (INT)            (E03.8) Subclinical hypothyroidism  Comment: will check TSH today; she's  been compliant with meds        The patient was ready to learn and no apparent learning or adherence barriers were identified. I explained the diagnosis and treatment plan, and the patient expressed understanding of the content. I attempted to answer any questions regarding the diagnosis and the proposed treatment.    Possible side effects of the prescribed medication was explained. We discussed the patient's current medications.  We discussed the importance of medication compliance. The patient expressed understanding and no barriers to adherence were identified.    follow-up will be scheduled for 1.5 months from now  she has been advised to call or return with any worsening or new problems

## 2022-08-12 ENCOUNTER — Telehealth (HOSPITAL_BASED_OUTPATIENT_CLINIC_OR_DEPARTMENT_OTHER): Payer: Self-pay

## 2022-08-12 NOTE — Telephone Encounter (Signed)
Other LVM    1st   Attempt      This team member called the Patient tofollow-up for Depression.    On behalf of PCPCohen, Bennye Alm, MD      Patient (self) did not answer, this team member left a message asking for a call back # [phone] or [phone]. Pt can leave a message best time to call Pt back or mychart    Pt has a Clinic and Care partner phone number in case pt wants to reach out.       Loretta Martin, 08/12/2022

## 2022-08-13 ENCOUNTER — Encounter (HOSPITAL_BASED_OUTPATIENT_CLINIC_OR_DEPARTMENT_OTHER): Payer: Self-pay

## 2022-08-17 ENCOUNTER — Telehealth (HOSPITAL_BASED_OUTPATIENT_CLINIC_OR_DEPARTMENT_OTHER): Payer: Self-pay

## 2022-08-17 NOTE — Telephone Encounter (Signed)
2nd Attempt      This team member called Loretta Patient tofollow-up for Depression.    On behalf of PCPCohen, Bennye Alm, MD      Patient (self) did not answer, this team member left a message asking for a call back # [phone] or [phone]. Pt can leave a message best time to call Pt back or mychart    Pt has a Clinic and Care partner phone number in case pt wants to reach out.       Theadore Martin, 08/17/2022

## 2022-08-23 ENCOUNTER — Telehealth (HOSPITAL_BASED_OUTPATIENT_CLINIC_OR_DEPARTMENT_OTHER): Payer: Self-pay

## 2022-08-23 ENCOUNTER — Encounter (HOSPITAL_BASED_OUTPATIENT_CLINIC_OR_DEPARTMENT_OTHER): Payer: Self-pay

## 2022-08-23 NOTE — Telephone Encounter (Addendum)
3rd Attempt      This team member called the Patient tofollow-up for Depression.    On behalf of PCPCohen, Haydee Monica, MD      Patient (self) did not answer, this team member left a message asking for a call back # [phone] or [phone]. Pt can leave a message best time to call Pt back or mychart    Pt has a Clinic and Care partner phone number in case pt wants to reach out.       Morene Crocker, 08/23/2022

## 2022-09-03 ENCOUNTER — Ambulatory Visit (HOSPITAL_BASED_OUTPATIENT_CLINIC_OR_DEPARTMENT_OTHER): Payer: Self-pay

## 2022-09-03 NOTE — Telephone Encounter (Signed)
Regarding: cough  ----- Message from [name] sent at 09/03/2022 11:30 AM EST -----  Loretta Martin [id number], 61 year old, female    Calls today:  Sick    What are the symptoms ; Patient is been coughing for about 2 weeks, feeling very tired. Wants to speak with the nurse  How long has patient been sick?   What has pt. tried at home       Person calling on behalf of patient: Patient (self)    CALL BACK NUMBER: [phone]  Best time to call back:   Cell phone:   Other phone:    Patient's language of care: English    Patient does not need an interpreter.    Patient's PCP: [name], MD    Primary Care Home Site:  [facility]

## 2022-09-03 NOTE — Telephone Encounter (Signed)
+  cough , non productive / dry ; ongoing for 2 weeks   +runny nose; clear   +fatigue    +headache ; 7/10 ; comes and goes for past 2 weeks   +coughing spells  ; duration: 1 minutes     Pt states "chest feels tried "    Reports using humidifier , using Robitussin, using inhaler , used OTC pain medication     No wheezing   No fever   No hemoptysis   No chest pain   No difficulty breathing   Neg COVID test     Provided home care instructions (fluid, ginger root clove tea, pomegranate skin tea, honey, humidifier) , pt expressed understanding   Reason for Disposition   Patient wants to be seen    Protocols used: Cough-A-OH  Advised per nursing triage protocol.  Verbalized understanding and agreement with instructions and disposition.     Recommended disposition for patient:Disposition: Advised to go to Urgent Care for Evaluation due to appointment availablity    If patient referred to UC/ED advised that they may require further follow up and testing after the visit with their primary care office.     Instructed patient to call back for any new, worsening, or worrisome symptoms or concerns any time day or night.

## 2022-09-14 ENCOUNTER — Ambulatory Visit: Payer: 59 | Attending: Internal Medicine | Admitting: Internal Medicine

## 2022-09-14 ENCOUNTER — Other Ambulatory Visit: Payer: Self-pay

## 2022-09-14 VITALS — BP 106/70 | HR 79 | Temp 96.2°F | Wt 148.4 lb

## 2022-09-14 DIAGNOSIS — Z124 Encounter for screening for malignant neoplasm of cervix: Secondary | ICD-10-CM | POA: Diagnosis not present

## 2022-09-14 DIAGNOSIS — Z01419 Encounter for gynecological examination (general) (routine) without abnormal findings: Secondary | ICD-10-CM | POA: Diagnosis present

## 2022-09-14 NOTE — Progress Notes (Signed)
SUBJECTIVE:    Loretta Martin is a   61 year old woman here for complete annual exam. She plans to see her primary care provider for an annual exam in the next year.     OB History   G6  P6  T3  P1  A0  L3    SAB0  IAB0  Ectopic0  Molar0  Multiple0  Live Births0       Comment: C/S X 2 followed by VBAC     CC/HPI:  routine GYN , denies any complaints     GYN History:  post menopausal    Sexual History: sexually active, monogamous, same partner     Past GYN History: no change since last visit    Significant OB History: premature delivery     Current Outpatient Medications   Medication Sig    FLUoxetine (PROZAC) 40 MG capsule Take 1 capsule by mouth every morning    cholecalciferol (VITAMIN D3) 25 MCG (1000 UT) tablet Take 1 tablet by mouth in the morning.    albuterol HFA 108 (90 Base) MCG/ACT inhaler Inhale 2 puffs into the lungs every 4 (four) hours as needed for Wheezing    levothyroxine (SYNTHROID) 25 MCG tablet Take 0.5 tablets by mouth every morning before breakfast    fluticasone (FLONASE) 50 MCG/ACT nasal spray 2 sprays by Each Nostril route daily    Multiple Vitamin (MULTIVITAMIN) TABS Take 1 tablet by mouth daily.    omega-3 acid ethyl esters (LOVAZA) 1 GM capsule Take 2 g by mouth 2 (two) times daily.     No current facility-administered medications for this visit.       Hydrocodone-Acetaminophen and Pcn [Penicillins]    Patient Active Problem List:     Nonspecific reaction to tuberculin skin test without active tuberculosis     Major depressive disorder, recurrent, in remission ([place])     Mammographic microcalcification, normal BX     PVD (posterior vitreous detachment)     Asymptomatic neurocysticercosis     Female stress incontinence     Family history of diabetes mellitus type II     Seasonal allergic rhinitis     BMI 26.0-26.9,adult     Varicose veins of lower extremity     Subclinical hypothyroidism      Past Surgical History:  No date: BREAST BIOPSY  No date: BREAST BIOPSY &  NEEDLE LOC WIR  No date: EXCISION EXCESSIVE SKIN & SUBQ TISSUE ABDOMEN      Comment:  abdominoplasty in [place]  1998: LIG/TRNSXJ FLP TUBE ABDL/VAG APPR UNI/BI      Comment:  Tubal Ligation  No date: OB ANTEPARTUM CARE CESAREAN DLVR & POSTPARTUM      Comment:  c/section x 2  2004: REDUCTION MAMMAPLASTY  No date: TRURL RF FEMALE BLADDER NECK STRS URIN INCONT    Social History     Socioeconomic History    Marital status: Divorced     Spouse name: Not on file    Number of children: 3    Years of education: Not on file    Highest education level: Not on file   Occupational History    Occupation: housecleaning     Employer: [employer]   Tobacco Use    Smoking status: Former     Types: Cigarettes     Start date: 10/05/2003     Quit date: 08/10/2004     Years since quitting: 18.1    Smokeless tobacco: Former    Tobacco comments:  smoked only for 6 months.   Substance and Sexual Activity    Alcohol use: Yes     Comment: minimal    Drug use: No    Sexual activity: Yes     Partners: Male     Birth control/protection: Surgical     Comment: men age 64 q 56, menorrhagia 2006, past history of syphillis and rx , 1st SA age 58, 43 lietime partners, separated again from her husband.   Other Topics Concern    Military Service No    Blood Transfusions No    Caffeine Concern Not Asked    Occupational Exposure No    Hobby Hazards No    Sleep Concern No    Stress Concern No    Weight Concern Yes    Special Diet No    Back Care Not Asked    Exercise No     Comment: not much    Bike Helmet Not Asked    Seat Belt Yes    Self-Exams Not Asked   Social History Narrative    To [place] from [place] in [place]. 3 children, all adults. Husband Jose. Housekeeper. Prior [facility] patient.        Daughter is Engineering geologist   Social Determinants of Librarian, academic Strain: Not on file  Food Insecurity: Not on file  Transportation Needs: Not on file  Physical Activity: Not on file  Stress: Not on file  Social Connections: Not on file  Intimate Partner  Violence: Not on file  Housing Stability: Not on file            Review of patient's family history indicates:  Problem: Cancer - Other      Relation: Father          Age of Onset: (Not Specified)          Comment: lung & skin cancer  Problem: Diabetes      Relation: Mother          Age of Onset: (Not Specified)  Problem: Hypertension      Relation: Mother          Age of Onset: (Not Specified)      ROS:  Constitutional: benign  Allergy: none  Endocrine: negative  Dermatologic: negative  Cardiovascular: negative  Respiratory: negative  Gastrointestinal: negative  Genitourinary: negative  Musculoskeletal: negative  Neurological: negative  Psychiatric: negative    PHYSICAL EXAM:  General: A+0x3,NAD  BP 106/70 (Site: LA, Position: Sitting, Cuff Size: Lrg)   Pulse 79   Temp 96.2 F (35.7 C) (Temporal)   Wt 67.3 kg (148 lb 6.4 oz)   LMP 11/02/2009   SpO2 97%   BMI 27.14 kg/m       Breasts: no masses, skin, nipple or axillary changes  Abdomen: no masses or tenderness    PELVIC:  External Genitalia: normal architecture  Urethral Meatus: normal size and location, without lesions or prolapse  Urethra: without masses, tenderness or scarring  Bladder: without fullness, masses or tenderness  Vagina: well rugated and no lesions  Vaginal Discharge: normal appearing  Pelvic supports: normal  Cervix: no lesions  Uterus: anteverted, normal size, and non-tender  Adnexa: no masses, nodularity, tenderness    Extremities: normal  Anus and Perineum: normal  Rectum: deferred      ASSESSMENT & PLAN:  (Z01.419) Routine gynecological examination  Comment: Normal GYN exam  Will f/u with results   Plan: CYTOPATH, C/V, THIN LAYER, OBTAINING SCREEN PAP  SMEAR, HUMAN PAPILLOMAVIRUS (HPV)

## 2022-09-15 LAB — HUMAN PAPILLOMAVIRUS (HPV): HUMAN PAPILLOMAVIRUS: NEGATIVE

## 2022-09-20 ENCOUNTER — Encounter (HOSPITAL_BASED_OUTPATIENT_CLINIC_OR_DEPARTMENT_OTHER): Payer: Self-pay

## 2022-09-20 ENCOUNTER — Emergency Department (HOSPITAL_BASED_OUTPATIENT_CLINIC_OR_DEPARTMENT_OTHER): Payer: 59

## 2022-09-20 ENCOUNTER — Other Ambulatory Visit: Payer: Self-pay

## 2022-09-20 ENCOUNTER — Emergency Department
Admission: AD | Admit: 2022-09-20 | Discharge: 2022-09-20 | Disposition: A | Discharge status: Home / Self Care | Payer: 59 | Source: Ambulatory Visit | Attending: Emergency Medicine | Admitting: Emergency Medicine

## 2022-09-20 DIAGNOSIS — B9789 Other viral agents as the cause of diseases classified elsewhere: Secondary | ICD-10-CM

## 2022-09-20 DIAGNOSIS — R059 Cough, unspecified: Secondary | ICD-10-CM | POA: Insufficient documentation

## 2022-09-20 DIAGNOSIS — J069 Acute upper respiratory infection, unspecified: Secondary | ICD-10-CM

## 2022-09-20 DIAGNOSIS — R0602 Shortness of breath: Secondary | ICD-10-CM | POA: Insufficient documentation

## 2022-09-20 LAB — CYTOPATH, C/V, THIN LAYER

## 2022-09-20 MED ORDER — ALBUTEROL SULFATE HFA 108 (90 BASE) MCG/ACT IN AERS
2.0000 | INHALATION_SPRAY | RESPIRATORY_TRACT | 0 refills | Status: DC | PRN
Start: 2022-09-20 — End: 2023-02-07

## 2022-09-20 MED ORDER — BENZONATATE 100 MG PO CAPS
100.00 mg | ORAL_CAPSULE | Freq: Three times a day (TID) | ORAL | 0 refills | Status: AC | PRN
Start: 2022-09-20 — End: 2022-09-23

## 2022-09-20 NOTE — UC Triage Notes (Signed)
Pt presents with 3 weeks of cough. Reports using OTC at home with no improvement. Feels as she is getting worse. Negative COVID test 3 days ago. Coughing worse at night. Hx of asthma and using inhalers but feels a whistling in chest and her chest feels heavy. VSS

## 2022-09-20 NOTE — Discharge Instructions (Signed)
Seu exame de radiografia de trax parece bom e no mostra nenhuma pneumonia. Quando voc fica doente, a tosse geralmente  o ltimo sintoma a desaparecer. Pode levar vrias semanas antes que isso acontea. Enquanto estiver com tosse, beba bastante lquido e tente usar um umidificador em casa, principalmente  noite, para que as vias respiratrias no fiquem muito secas. O mel tambm pode ajudar com a tosse (mas s  seguro para crianas com mais de um ano) ou voc pode tentar pastilhas para a tosse.    Se a tosse ainda no melhorar na prxima semana, converse com seu mdico para ver se ele deseja fazer algum outro teste ou tratamento.    A qualquer momento, se voc tiver febre alta, dificuldade para respirar ou dor no peito, v diretamente ao pronto-socorro.    Your chest xray test looks ok and does not show any pneumonia.  When you get sick, the cough is usually the last symptom to go away.  It can take several weeks before it does.  While you have the cough, be sure to drink lots of liquids and try to use a humidifier at home, especially overnight, so your airways don't get too dry.  Honey can also help with a cough (but is only safe for children older than one) or you can try cough drops.      If the cough is still not improving over the next week, please do talk to your doctor to see if they want to do any other testing or treatment.    At any point if you have high fevers or trouble breathing or chest pain, please go directly to the ER.

## 2022-09-20 NOTE — Narrator Note (Signed)
Patient Disposition  Patient education for diagnosis, medications, activity, diet and follow-up.  Patient left UC 7:10 PM.  Patient rep received written instructions.    Interpreter to provide instructions: No    Patient belongings with patient: YES    Have all existing LDAs been addressed? N/A    Have all IV infusions been stopped? N/A    Destination: Home

## 2022-09-21 ENCOUNTER — Encounter (HOSPITAL_BASED_OUTPATIENT_CLINIC_OR_DEPARTMENT_OTHER): Payer: Self-pay | Admitting: Internal Medicine

## 2022-09-21 ENCOUNTER — Telehealth (HOSPITAL_BASED_OUTPATIENT_CLINIC_OR_DEPARTMENT_OTHER): Payer: Self-pay | Admitting: Internal Medicine

## 2022-09-21 NOTE — Telephone Encounter (Signed)
Loretta Martin [id number], 61 year old, female    Calls today:  ED or Hospital Discharge call from patient URGENT CARE   Date of ED visit or discharge 12/19  Reason for visit or hospitalization VIRAL URI AND COUGH   Location of ER visit or hospitalization [place]           Person calling on behalf of patient: Patient (self)    CALL BACK NUMBER: [phone]  Best time to call back:   Cell phone:   Other phone:    Patient's language of care: English    Patient does not need an interpreter.    Patient's PCP: [name], MD    Primary Care Home Site:  [facility]

## 2022-09-21 NOTE — Telephone Encounter (Signed)
Called pt  Confirmed name and dob  Pt to Urgent care yesterday  Xray of chest was okay  No PNA    Pt with cough  Discussed the following with pt        For a Cough  Take an Expectorant. ...  Reach for a Cough Suppressant. ...  Sip a Warm Beverage. ...  Step Up Your Fluid Intake. ...  Suck on [employer]. ...  Consider a Cough Medicine Formulated for Nighttime Use. ...  Have Some Honey. ...  Zap Your Cough With a Vaporizer.      Advised to monitor symptoms and if not improved by next week to call back the clinic    Harrison Mons, RN 09/21/22 9:16 AM

## 2022-09-24 NOTE — UC Provider Notes (Signed)
I have reviewed the UC nursing notes. I have reviewed the patient's past medical history/problem list, allergies, social history and medication list.  I saw this patient primarily.    HPI:  This is a 61 year old patient presenting with 3 wks cough, negative COVID testing, cough worse at night, has a history of asthma and is using her inhalers    History obtained by me from: patient    ROS: Pertinent positives were reviewed as per the HPI above.     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      Comment:  treated [facility] 11/97 CSF VDRL neg 9/00 RPR 9/00 1:2 repeat                RPR yearly   08/31/2004: Nonspecific abnormal results of thyroid function study      Comment:  increased TSH 4.19 6/00; TSH nl 9/00; 05/04/99 - wnl  04/23/2009: Presbyopia  04/23/2009: PVD (posterior vitreous detachment)  08/31/2004: Trichomoniasis, unspecified      Comment:  10/97  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      Comment:  +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 - had negative CXR pp at [facility] referred to TB               clinic  08/31/2004: Urinary tract infection, site not specified      Comment:  4/02, 6/02  Patient Active Problem List:     Nonspecific reaction to tuberculin skin test without active tuberculosis     Major depressive disorder, recurrent, in remission ([place])     Mammographic microcalcification, normal BX     PVD (posterior vitreous detachment)     Asymptomatic neurocysticercosis     Female stress incontinence     Family history of diabetes mellitus type II     Seasonal allergic rhinitis     BMI 26.0-26.9,adult     Varicose veins of lower extremity     Subclinical hypothyroidism      Past Surgical History:  Past Surgical History:  No date: BREAST BIOPSY  No date: BREAST BIOPSY & NEEDLE LOC WIR  No date: EXCISION  EXCESSIVE SKIN & SUBQ TISSUE ABDOMEN      Comment:  abdominoplasty in [place]  1998: LIG/TRNSXJ FLP TUBE ABDL/VAG APPR UNI/BI      Comment:  Tubal Ligation  No date: OB ANTEPARTUM CARE CESAREAN DLVR & POSTPARTUM      Comment:  c/section x 2  2004: REDUCTION MAMMAPLASTY  No date: TRURL RF FEMALE BLADDER NECK STRS URIN INCONT    Medications:   No current facility-administered medications for this encounter.     Current Outpatient Medications   Medication Sig    albuterol HFA (PROAIR HFA) 108 (90 Base) MCG/ACT inhaler Inhale 2 puffs into the lungs every 4 (four) hours as needed for Wheezing  for up to 5 days    FLUoxetine (PROZAC) 40 MG capsule Take 1 capsule by mouth every morning    cholecalciferol (VITAMIN D3) 25 MCG (1000 UT) tablet Take 1 tablet by mouth in the morning.    albuterol HFA 108 (90 Base) MCG/ACT inhaler Inhale 2 puffs into the lungs every 4 (four) hours as needed for Wheezing    levothyroxine (  SYNTHROID) 25 MCG tablet Take 0.5 tablets by mouth every morning before breakfast    fluticasone (FLONASE) 50 MCG/ACT nasal spray 2 sprays by Each Nostril route daily    Multiple Vitamin (MULTIVITAMIN) TABS Take 1 tablet by mouth daily.    omega-3 acid ethyl esters (LOVAZA) 1 GM capsule Take 2 g by mouth 2 (two) times daily.       Social History:  Social History    Tobacco Use      Smoking status: Former        Types: Cigarettes        Start date: 10/05/2003        Quit date: 08/10/2004        Years since quitting: 18.1      Smokeless tobacco: Former      Tobacco comments: smoked only for 6 months.    Alcohol use: Yes      Comment: minimal      Family History:   Review of patient's family history indicates:  Problem: Cancer - Other      Relation: Father          Age of Onset: (Not Specified)          Comment: lung & skin cancer  Problem: Diabetes      Relation: Mother          Age of Onset: (Not Specified)  Problem: Hypertension      Relation: Mother          Age of Onset: (Not Specified)       Allergies:  Review of  Patient's Allergies indicates:   Hydrocodone-acetami*    Nausea and Vomiting   Pcn [penicillins]           Comment:Got a urine infection after using    Physical Exam:   09/20/22  1840   BP: 107/70   Pulse: 108   Resp: 20   Temp: 97.9 F   SpO2: 96%   Weight: 67.1 kg (148 lb)     GEN: No acute distress, appears comfortable  HEENT: Atraumatic, normocephalic.  PERRL, EOMI.  Conjunctiva clear.    CARDIOVASC: Regular rate and rhythm, no murmurs, rubs, and gallops.    RESP: No respiratory distress, good air exchange, breath sounds CTAB.  NEURO: Alert and oriented, CN III-XII grossly nl, MAEx4  PSYCH: Appropriate affect and thought  SKIN: Warm, dry, no rashes or bruising visible          UC Course and Medical Decision-making:    The patient is a 61 year old with cough and congestion, given age and duration did check a chest x-ray, no infiltrate, still likely viral, already has an inhaler but needs new rx, given, and tessalon for comfort.  Discussed reasons to seek ED care, home care and follow up plan.    Disposition:  Discharged    Condition: Stable    Diagnosis/Diagnoses:  Viral URI with cough      Lendell Caprice, MD

## 2022-10-21 ENCOUNTER — Encounter (HOSPITAL_BASED_OUTPATIENT_CLINIC_OR_DEPARTMENT_OTHER): Payer: Self-pay

## 2022-10-21 NOTE — Progress Notes (Signed)
Pt walks in today c/o red, swollen, scratchy, itchy, burning, watery right eye since this morning  Left eye starting to get red as well     Greeted pt   Confirmed name and dob    Pt noted that right eyelids were "stuck together" upon waking up this morning  She is able to blink but reports that vision is cloudy" like there's "sand" in her eye     Denies fever, cough, loss of vision, sore throat, CP, sob, dizziness    Refusing UC or ED, states would rather come in for an appt tomorrow   Appt scheduled with Dr. Rogue Bussing for 1100 for further eval   Discussed reasons to go to the ED and pt verbalized understanding    [facility] RN

## 2022-10-22 ENCOUNTER — Ambulatory Visit: Payer: Self-pay | Attending: Family Medicine | Admitting: Family Medicine

## 2022-10-22 ENCOUNTER — Other Ambulatory Visit: Payer: Self-pay

## 2022-10-22 ENCOUNTER — Encounter (HOSPITAL_BASED_OUTPATIENT_CLINIC_OR_DEPARTMENT_OTHER): Payer: Self-pay | Admitting: Family Medicine

## 2022-10-22 ENCOUNTER — Encounter (HOSPITAL_BASED_OUTPATIENT_CLINIC_OR_DEPARTMENT_OTHER): Payer: 59 | Admitting: Internal Medicine

## 2022-10-22 VITALS — BP 116/70 | HR 89 | Temp 97.3°F | Wt 146.0 lb

## 2022-10-22 DIAGNOSIS — H1033 Unspecified acute conjunctivitis, bilateral: Secondary | ICD-10-CM | POA: Diagnosis not present

## 2022-10-22 MED ORDER — ERYTHROMYCIN 5 MG/GM OP OINT
0.50 [in_us] | TOPICAL_OINTMENT | Freq: Four times a day (QID) | OPHTHALMIC | 0 refills | Status: AC
Start: 2022-10-22 — End: 2022-10-29

## 2022-10-22 NOTE — Progress Notes (Signed)
Patient: Loretta Martin  Visit Provider: Jeralene Peters, MD   MRN: [id number]  Date of Service: 10/22/22   Date of Birth: [birth year]   Time of Service: 11:13 AM             ASSESSMENT & PLAN    1. Acute conjunctivitis of both eyes, unspecified acute conjunctivitis type  Mostly likely viral given thin discharge, rapid bilateral progression and coexisting cough. Given severity, will prescribe antibiotics in case of bacterial infection. Advised patient to use if discharge becomes more purulent or if no improvement after 5 days.  - erythromycin [facility]) ophthalmic ointment; Place 0.5 Inches into both eyes every 6 (six) hours  for 7 days  Dispense: 3.5 g; Refill: 0  - given educational materials  - given ED precautions     Loretta Martin had all questions answered by the end of our visit and expressed an understanding of our plan.      I spent a total of 30 minutes on this visit on the date of service (total time includes all activities performed on the date of service)       [place] is a 62 year old female. No chief complaint on file.    Interpreter used: No.      #R eye pain  Started yesterday - drainage, burning, itching, swollen eyelids  Felt like film over eye and causing blurry vision  Spread to left eye last night  Discharge is tinted yellow  Started coughing last night. Two days ago had red papule on forehead  Denies pain, contact lenses, sick contacts, fever, chills, rhinorrhea  Had similar episode years ago and improved with abx ointment       OBJECTIVE   10/22/22  1111   BP: 116/70   Site: Left Arm   Position: Sitting   Cuff Size: Regular   Pulse: 89   Temp: 97.3 F (36.3 C)   TempSrc: Oral   SpO2: 96%   Weight: 66.2 kg (146 lb)       Most Recent BP Reading(s)  10/22/22 : 116/70  09/20/22 : 107/70  09/14/22 : 106/70      Most Recent Weight Reading(s)  10/22/22 : 66.2 kg (146 lb)  09/20/22 : 67.1 kg (148 lb)  09/14/22 : 67.3 kg (148 lb 6.4 oz)  08/09/22 : 66.7 kg  (147 lb)  06/25/22 : 66.7 kg (147 lb)      Patient's last menstrual period was 11/02/2009.    General: NAD, well-nourished, well-developed, cooperative  Neuro: AAOx3, grossly intact  HEENT: atraumatic, EOMI and not painful. no scleral icterus. neck supple. Bilateral conjunctival injection and periorbital erythema and edema. Clear, thin discharge bilaterally.       Lungs: breathing unlabored, no accessory muscle use  Extremities: no apparent edema  Skin: no rashes apparent on exposed skin  Psych: normal affect    Blood Sugar  Lab Results   Component Value Date    HGBA1C 5.3 01/16/2020    GLUCOSER 144 07/15/2022    FINGERSTICKR 156 10/21/2011     Cholesterol  Lab Results   Component Value Date    CHOLESTEROL 255 (*H) 01/25/2020    TG 60 01/25/2020    HDL 60 01/25/2020    LDL 174 01/25/2020           Kidney Function  Lab Results   Component Value Date    BUN 17 07/15/2022    CREAT 0.8 07/15/2022  GFR > 60 07/15/2022     Liver Function  Lab Results   Component Value Date    AST 24 07/15/2022    ALT 22 07/15/2022           Electrolytes  Lab Results   Component Value Date    NA 143 07/15/2022    K 4.5 07/15/2022    CL 101 07/15/2022    CA 9.8 07/15/2022           HISTORY    Problem List:  2019-01: Left foot pain  2019-01: Hypertriglyceridemia  2019-01: h/o Abnormal CXR, resolved  2018-08: Subclinical hypothyroidism  2018-04: Reactive airway disease  2016-12: BMI 26.0-26.9,adult  2016-04: Seasonal allergic rhinitis  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  2014-10: Varicose veins of lower extremity  2014-06: CTS (carpal tunnel syndrome)  2013-07: Neck pain on left side  2013-06: Cervical radiculopathy  2012-04: Vitamin D deficiency  2012-03: Family history of diabetes mellitus type II  2011-05: Female stress incontinence  2011-03: Asymptomatic neurocysticercosis  2010-07: Presbyopia  2010-07: PVD (posterior vitreous detachment)  2009-06: Chronic low back pain  2009-04: DPH-CARE COORDINATION PROGRAM PARTICIPANT  2008-07: IBS  (irritable bowel syndrome)  2007-06: Cough  2007-05: Mammographic microcalcification, normal BX  2006-03: Meniere's disease, unspecified  2005-11: HX SYPHILIS NOS  2005-11: Trichomoniasis, unspecified  2005-11: Nonspecific reaction to tuberculin skin test without active   tuberculosis  2005-11: Nonspecific abnormal results of thyroid function study  2005-11: Major depressive disorder, recurrent, in remission ([place])  2005-11: Urinary tract infection, site not specified  Elevated troponin  Dizzy    family history includes Cancer - Other in her father; Diabetes in her mother; Hypertension in her mother.  Current Outpatient Medications   Medication Sig    albuterol HFA (PROAIR HFA) 108 (90 Base) MCG/ACT inhaler Inhale 2 puffs into the lungs every 4 (four) hours as needed for Wheezing  for up to 5 days    FLUoxetine (PROZAC) 40 MG capsule Take 1 capsule by mouth every morning    cholecalciferol (VITAMIN D3) 25 MCG (1000 UT) tablet Take 1 tablet by mouth in the morning.    albuterol HFA 108 (90 Base) MCG/ACT inhaler Inhale 2 puffs into the lungs every 4 (four) hours as needed for Wheezing    levothyroxine (SYNTHROID) 25 MCG tablet Take 0.5 tablets by mouth every morning before breakfast    fluticasone (FLONASE) 50 MCG/ACT nasal spray 2 sprays by Each Nostril route daily    Multiple Vitamin (MULTIVITAMIN) TABS Take 1 tablet by mouth daily.    omega-3 acid ethyl esters (LOVAZA) 1 GM capsule Take 2 g by mouth 2 (two) times daily.     No current facility-administered medications for this visit.     Review of Patient's Allergies indicates:   Hydrocodone-acetami*    Nausea and Vomiting   Pcn [penicillins]           Comment:Got a urine infection after using          Lily Peer, MD  [place]  10/22/22  11:13 AM

## 2023-02-07 ENCOUNTER — Ambulatory Visit: Payer: 59 | Attending: Internal Medicine | Admitting: Internal Medicine

## 2023-02-07 ENCOUNTER — Telehealth (HOSPITAL_BASED_OUTPATIENT_CLINIC_OR_DEPARTMENT_OTHER): Payer: Self-pay | Admitting: Internal Medicine

## 2023-02-07 ENCOUNTER — Encounter (HOSPITAL_BASED_OUTPATIENT_CLINIC_OR_DEPARTMENT_OTHER): Payer: Self-pay | Admitting: Internal Medicine

## 2023-02-07 VITALS — BP 105/69 | HR 91 | Temp 97.8°F | Wt 144.0 lb

## 2023-02-07 DIAGNOSIS — E038 Other specified hypothyroidism: Secondary | ICD-10-CM | POA: Diagnosis present

## 2023-02-07 DIAGNOSIS — H5789 Other specified disorders of eye and adnexa: Secondary | ICD-10-CM | POA: Diagnosis present

## 2023-02-07 DIAGNOSIS — F3340 Major depressive disorder, recurrent, in remission, unspecified: Secondary | ICD-10-CM | POA: Insufficient documentation

## 2023-02-07 DIAGNOSIS — M542 Cervicalgia: Secondary | ICD-10-CM | POA: Insufficient documentation

## 2023-02-07 LAB — VITAMIN D,25 HYDROXY: VITAMIN D,25 HYDROXY: 45 ng/mL (ref 30.0–100.0)

## 2023-02-07 LAB — TSH (THYROID STIMULATING HORMONE): TSH (THYROID STIM HORMONE): 6.4 u[IU]/mL — ABNORMAL HIGH (ref 0.270–4.200)

## 2023-02-07 MED ORDER — BUPROPION HCL ER (SR) 150 MG PO TB12
150.0000 mg | ORAL_TABLET | Freq: Two times a day (BID) | ORAL | 11 refills | Status: DC
Start: 2023-02-07 — End: 2023-03-21

## 2023-02-07 MED ORDER — LEVOTHYROXINE SODIUM 25 MCG PO TABS
12.5000 ug | ORAL_TABLET | Freq: Every morning | ORAL | 11 refills | Status: DC
Start: 2023-02-07 — End: 2024-02-14

## 2023-02-07 MED ORDER — FLUOXETINE HCL 20 MG PO CAPS
20.0000 mg | ORAL_CAPSULE | Freq: Every morning | ORAL | 12 refills | Status: DC
Start: 2023-02-07 — End: 2023-03-21

## 2023-02-07 NOTE — Telephone Encounter (Signed)
Central Refill Department to complete a benefit analysis for the SHINGRIX Vaccine.    The vaccine is covered under the patient's [place] medical coverage.    Please choose Private

## 2023-02-07 NOTE — Progress Notes (Signed)
Cc: several items    Depression  She notes that she's been compliant with her fluoxetine 40 mg daily -but doesn't feel like it's working; not with a therapist at present  Despite this, she feels quite down, low motivation, hard to get things done that she would like to  Please see GAD-7 & PHQ-9 for additional details.    She does not report thoughts of death, suicidal thoughts or suicide attempts   She notes a pressure/aching in the back of her neck - which she takes acetaminophen up to BID which relieves the pain  The patient does not report any symptoms of neurological impairment or TIA's; no amaurosis, diplopia, dysphasia, or unilateral disturbance of motor or sensory function. No loss of balance or vertigo.      Eye redness  She notes that she has had recurrent red eye - when that occurs light bothers her & her eye is itching & her vision blurred  Doesn't have that at present  Went to see MD & was dx with conjunctivitis  but keeps on recurring      ROS: sometimes her legs ache; has noted occasionally rashes on her face & arms at times, red & itchy    BP 105/69 (Site: RA, Position: Sitting, Cuff Size: Reg)   Pulse 91   Temp 97.8 F (36.6 C) (Oral)   Wt 65.3 kg (144 lb)   LMP 11/02/2009   SpO2 97%   BMI 26.34 kg/m   eyes normal, pupil reactive, normal conjunctiva, normal fundus, normal cornea.   The neck is supple and free of adenopathy or masses, the thyroid is normal without enlargement or nodules.   Heart: S1 and S2 normal, no murmurs, clicks, gallops or rubs. Regular rate and rhythm.   Lungs:  clear; no wheezes, rhonchi or rales.  MMSE: well groomed, affect normal, no tangential thoughts, no pressured speech, no agitation or psychomotor slowing, normal response-time to questions  Neuro: alert & oriented x 3, no dysarthria or aphasia, gait wnl  Skin: I note only benign skin findings. No unusual rashes or suspicious skin lesions noted. Nails appear normal.     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, recurrent, in remission (HCC)  Comment: the fluoxetine 40 mg is not adequate - will add buproprion - will also decrease fluoxetine to 20 mg - will see how this regimen is helping her in follow up in 6 weeks   In addition, will connect with therapist  Plan: RFL TO ADULT OUTPT PSYCH (NEW) [facility],     Due for repeat :[place]         SCREENING MAMMO BILATERAL DIGITAL WITH DBT &         CAD                (H57.89) Red eye  Comment: given the recurrent nature & the question of visual blurriness when occurs - will refer   Plan: REFERRAL TO OPHTHALMOLOGY (INT)            (E03.8) Subclinical hypothyroidism  Comment: will check  Plan: TSH (THYROID STIMULATING HORMONE)          Back of neck aching & regular use of acetaminophen  Might be her depression or stress; but might be some component of rebound analgesic headaches - so will stop all acetaminophen & follow up with me if neck pain continues - exam normal today & no alarm symptoms     The patient was ready to learn  and no apparent learning or adherence barriers were identified. I explained the diagnosis and treatment plan, and the patient expressed understanding of the content. I attempted to answer any questions regarding the diagnosis and the proposed treatment.    Possible side effects of the prescribed medication was explained. We discussed the patient's current medications.  We discussed the importance of medication compliance. The patient expressed understanding and no barriers to adherence were identified.    follow-up will be scheduled for 6 weeks from now  she has been advised to call or return with any worsening or new problems

## 2023-02-07 NOTE — Addendum Note (Signed)
Addended by: Greer Ee on: 02/07/2023 05:59 PM     Modules accepted: Orders

## 2023-02-07 NOTE — Progress Notes (Signed)
Hi RN's-     Please call pt to let her know that I received her lab tests.    Thyroid levels remain very slightly low - nothing to worry about - just remember to take her levothyroxine every day & we can repeat in 4 months the blood work. Vit D is normal.    Thanks, [employer]

## 2023-02-08 ENCOUNTER — Telehealth (HOSPITAL_BASED_OUTPATIENT_CLINIC_OR_DEPARTMENT_OTHER): Payer: Self-pay

## 2023-02-08 ENCOUNTER — Telehealth (HOSPITAL_BASED_OUTPATIENT_CLINIC_OR_DEPARTMENT_OTHER): Payer: Self-pay | Admitting: Registered Nurse

## 2023-02-08 NOTE — Telephone Encounter (Signed)
1st  Attempt      This team member called the Patient tofollow-up for Depression.    On behalf of PCPCohen, Haydee Monica, MD      Patient (self) did not answer, this team member left a message asking for a call back # [phone] or [phone]. Pt can leave a message best time to call Pt back or mychart.    Pt has a Clinic and Care partner phone number in case pt wants to reach out.    Morene Crocker, 02/08/2023

## 2023-02-08 NOTE — Telephone Encounter (Signed)
-----   Message from Greer Ee, MD sent at 02/07/2023  5:59 PM EDT -----  Hi RN's-     Please call pt to let her know that I received her lab tests.    Thyroid levels remain very slightly low - nothing to worry about - just remember to take her levothyroxine every day & we can repeat in 4 months the blood work. Vit D is normal.    Thanks, [employer]

## 2023-02-08 NOTE — Telephone Encounter (Signed)
Called pt via [place] interpretor ID #  [id number] [id number]  No answer  Left message to call back the clinic  Cipriano Mile, RN 02/08/23 10:35 AM

## 2023-02-09 ENCOUNTER — Encounter (HOSPITAL_BASED_OUTPATIENT_CLINIC_OR_DEPARTMENT_OTHER): Payer: Self-pay

## 2023-02-10 ENCOUNTER — Telehealth (HOSPITAL_BASED_OUTPATIENT_CLINIC_OR_DEPARTMENT_OTHER): Payer: Self-pay

## 2023-02-10 NOTE — Telephone Encounter (Signed)
Called pt   No answer, lvm to call back clinic     Gabby RN

## 2023-02-10 NOTE — Telephone Encounter (Signed)
-----   Message from Pieter Cohen, MD sent at 02/07/2023  5:59 PM EDT -----  Hi RN's-     Please call pt to let her know that I received her lab tests.    Thyroid levels remain very slightly low - nothing to worry about - just remember to take her levothyroxine every day & we can repeat in 4 months the blood work. Vit D is normal.    Thanks, Pieter

## 2023-02-11 ENCOUNTER — Telehealth (HOSPITAL_BASED_OUTPATIENT_CLINIC_OR_DEPARTMENT_OTHER): Payer: Self-pay

## 2023-02-11 NOTE — Telephone Encounter (Signed)
Called pt   Confirmed name and dob     Relayed provider's message   Pt verbalized understanding and agreeable with plan    Gabby RN

## 2023-02-11 NOTE — Telephone Encounter (Signed)
2nd  Attempt      This team member called the Patient tofollow-up for Depression.    On behalf of PCPCohen, Haydee Monica, MD      Patient (self) did not answer, this team member left a message asking for a call back # [phone] or [phone]. Pt can leave a message best time to call Pt back or mychart.    Pt has a Clinic and Care partner phone number in case pt wants to reach out.    Morene Crocker, 02/11/2023

## 2023-02-11 NOTE — Telephone Encounter (Signed)
Patient calling back. Please call

## 2023-02-14 ENCOUNTER — Telehealth (HOSPITAL_BASED_OUTPATIENT_CLINIC_OR_DEPARTMENT_OTHER): Payer: Self-pay

## 2023-02-14 ENCOUNTER — Encounter (HOSPITAL_BASED_OUTPATIENT_CLINIC_OR_DEPARTMENT_OTHER): Payer: Self-pay

## 2023-02-14 NOTE — Telephone Encounter (Signed)
Care Partner Note    Type of encounter:  Initial Telephone Encounter    Patient was referred to care partner through:  Inbasket message from PCP or PA    Reason for encounter: depression    Assessment:   Female 62 years old. Pt lives with her husband, her son and daughter. Pt has 3 children and 4 grandchildren. Pt works as a Land. Pt doesn't have much time for herself. Pt every other week she has Monday off and she goes to a massagist to help with the pain in her body. Pt stopped doing exercise because she doesn't have energy after work. Pt is thinking about going back. I shared with her resources she can use on a day basis and [place]. Pt will try the resources she will call the clinic if she needs. I'll follow up with her in 2 weeks.     Appears to be a Step 1 patient on the Stepped Model of Care    Patient Active Problem List:     Nonspecific reaction to tuberculin skin test without active tuberculosis     Major depressive disorder, recurrent, in remission (HCC)     Mammographic microcalcification, normal BX     PVD (posterior vitreous detachment)     Asymptomatic neurocysticercosis     Female stress incontinence     Family history of diabetes mellitus type II     Seasonal allergic rhinitis     BMI 26.0-26.9,adult     Varicose veins of lower extremity     Subclinical hypothyroidism      Medication: levothyroxine (SYNTHROID) 25 MCG tablet, Take 0.5 tablets by mouth every morning before breakfast, Disp: 15 tablet, Rfl: 11  FLUoxetine (PROZAC) 20 MG capsule, Take 1 capsule by mouth every morning, Disp: 60 capsule, Rfl: 12  buPROPion (WELLBUTRIN SR) 150 MG 12 hr tablet, Take 1 tablet by mouth in the morning and 1 tablet before bedtime., Disp: 60 tablet, Rfl: 11  cholecalciferol (VITAMIN D3) 25 MCG (1000 UT) tablet, Take 1 tablet by mouth in the morning., Disp: 60 tablet, Rfl: 12  fluticasone (FLONASE) 50 MCG/ACT nasal spray, 2 sprays by Each Nostril route daily, Disp: 1 Bottle, Rfl:  11  [DISCONTINUED] Estradiol (ESTROGEL) 0.75 MG/1.25 GM (0.06%) topical gel, Place 1.25 g onto the skin every 3 (three) days., Disp: 50 g, Rfl: 11  Multiple Vitamin (MULTIVITAMIN) TABS, Take 1 tablet by mouth daily., Disp: , Rfl:   omega-3 acid ethyl esters (LOVAZA) 1 GM capsule, Take 2 g by mouth 2 (two) times daily., Disp: , Rfl:     No current facility-administered medications on file prior to visit.      Relevant screening scores:  AWQ:   PHQ-9:       02/07/2023    10:21 AM 06/26/2018     9:06 AM 05/13/2017     8:47 AM   PHQ-9 TOTAL SCORE   Doc FlowSheet Total Score 8 3 6      GAD-7:       02/07/2023    10:21 AM   GAD-7 Total   GAD-7 Score 13     Child/Adolescent:    How difficult have these problems made it for you to do your work, take care of things at home, or get along with people?  Somewhat difficult     Engagement/motivation for change (Reference)    Preparation    Primary brief Intervention provided during this encounter:  Psychoeducation, Emotional Support, Self Care Planning, Motivational Interviewing , Provided  Resources, and Care Coordination Other [facility] [place]    PLAN:   [facility] [place] in [place]     Work on physical, pleasant activities, stress release( relaxation technique) and BH app  In [place] "[place]".     Pt has a Clinic and Care partner phone number in case pt wants to reach out.    Pt will call your primary care clinic with any further questions or concerns.      In the case of a life threatening emergency, pt will present to the emergency room or call 911.       TIME SPENT: 20-30 minutes     Morene Crocker, 02/14/2023

## 2023-02-21 ENCOUNTER — Encounter (HOSPITAL_BASED_OUTPATIENT_CLINIC_OR_DEPARTMENT_OTHER): Payer: Self-pay | Admitting: Registered Nurse

## 2023-03-01 ENCOUNTER — Telehealth (HOSPITAL_BASED_OUTPATIENT_CLINIC_OR_DEPARTMENT_OTHER): Payer: Self-pay

## 2023-03-01 NOTE — Telephone Encounter (Signed)
This team member called the Patient tofollow-up for Depression.    On behalf of PCPCohen, Haydee Monica, MD      Patient (self) did not answer, this team member left a message asking for a call back # [phone] or [phone]. Pt can leave a message best time to call Pt back or mychart.    Pt has a Clinic and Care partner phone number in case pt wants to reach out.    Morene Crocker, 03/01/2023

## 2023-03-07 ENCOUNTER — Telehealth (HOSPITAL_BASED_OUTPATIENT_CLINIC_OR_DEPARTMENT_OTHER): Payer: Self-pay

## 2023-03-07 NOTE — Telephone Encounter (Signed)
2nd  Attempt     This team member called the Patient tofollow-up for Depression.    On behalf of PCPCohen, Haydee Monica, MD      Patient (self) did not answer, this team member left a message asking for a call back # [phone] or [phone]. Pt can leave a message best time to call Pt back or mychart.    Pt has a Clinic and Care partner phone number in case pt wants to reach out.    Morene Crocker, 03/07/2023

## 2023-03-15 ENCOUNTER — Other Ambulatory Visit: Payer: Self-pay

## 2023-03-15 ENCOUNTER — Encounter (HOSPITAL_BASED_OUTPATIENT_CLINIC_OR_DEPARTMENT_OTHER): Payer: Self-pay | Admitting: Ophthalmology

## 2023-03-15 ENCOUNTER — Ambulatory Visit (HOSPITAL_BASED_OUTPATIENT_CLINIC_OR_DEPARTMENT_OTHER): Payer: 59 | Admitting: Ophthalmology

## 2023-03-15 ENCOUNTER — Ambulatory Visit: Payer: 59 | Attending: Ophthalmology | Admitting: Ophthalmology

## 2023-03-15 DIAGNOSIS — H04129 Dry eye syndrome of unspecified lacrimal gland: Secondary | ICD-10-CM | POA: Insufficient documentation

## 2023-03-15 DIAGNOSIS — H524 Presbyopia: Secondary | ICD-10-CM | POA: Diagnosis present

## 2023-03-15 DIAGNOSIS — H5203 Hypermetropia, bilateral: Secondary | ICD-10-CM | POA: Insufficient documentation

## 2023-03-15 DIAGNOSIS — H52203 Unspecified astigmatism, bilateral: Secondary | ICD-10-CM | POA: Diagnosis present

## 2023-03-15 MED ORDER — CARBOXYMETHYLCELLULOSE SODIUM 0.5 % OP SOLN
1.00 [drp] | Freq: Four times a day (QID) | OPHTHALMIC | 12 refills | Status: AC
Start: 2023-03-15 — End: 2025-03-14

## 2023-03-15 NOTE — Progress Notes (Signed)
Patient presents with:  Routine Eye Exam  Red Eye: She has had a problem with red, sticky eyes, and itching, since January, intermittent-recurring, but none now. Doing okay now. For comprehensive eye exam.   Likely infectious conjunctivitis, resolve.    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  She will use artificial tears in both eyes, 4-6 times a day.    Eye Problem: Blurry vision, both eyes, distance and near.  Astigmatism and presbyopia.

## 2023-03-15 NOTE — Progress Notes (Signed)
Eye health check, she has not been seen since 2010 she feels her vision is ok but she would like an RX update. She has had conjunctivitis four times since January, she is concerned about this.        Neg inj/surg

## 2023-03-21 ENCOUNTER — Ambulatory Visit: Payer: 59 | Attending: Internal Medicine | Admitting: Internal Medicine

## 2023-03-21 DIAGNOSIS — F3340 Major depressive disorder, recurrent, in remission, unspecified: Secondary | ICD-10-CM | POA: Insufficient documentation

## 2023-03-21 MED ORDER — FLUOXETINE HCL 40 MG PO CAPS
40.0000 mg | ORAL_CAPSULE | Freq: Every day | ORAL | 12 refills | Status: DC
Start: 2023-03-21 — End: 2024-07-27

## 2023-03-21 NOTE — Progress Notes (Signed)
follow up depression/anxiety    She started buproprion - took only 3 doses because didn't like how it made her feels all jittery  So, she restarted with fluoxetine 40 mg & now feels that this is sufficient & would prefer to continue with that dose    She does not report thoughts of death, suicidal thoughts or suicide attempts     provided supportive counseling     20 minutes was spent on the day of the visit on some or all of the following activities:   Preparing to see the patient (eg, review of tests)   Obtaining and/or reviewing separately obtained history   Performing a medically appropriate examination and/or evaluation   Counseling and educating the patient/family/caregiver   Ordering medications, tests, or procedures   Referring and communicating with other health care professionals    Documenting clinical information in the electronic or other health record   Independently interpreting results and  communicating results to the patient/family/caregiver   Care coordination

## 2023-03-23 ENCOUNTER — Telehealth (HOSPITAL_BASED_OUTPATIENT_CLINIC_OR_DEPARTMENT_OTHER): Payer: Self-pay

## 2023-03-23 NOTE — Telephone Encounter (Signed)
Left message for patient to schedule appointment. Per PCP message

## 2023-03-23 NOTE — Telephone Encounter (Signed)
-----   Message from Greer Ee sent at 03/21/2023  9:51 AM EDT -----  Hi team,  Please call patient to have a follow-up in person visit with me for "depression f/u & other questions" in 1-3 months   Much thanks,  [employer]

## 2023-04-06 IMAGING — CR RX COLUNA TORACO LOMBAR
1 series · 3 of 3 positions shown · non-contrast
Comparison: none

[Series 1: antero-posterior (ap) · 0.14mm/px · 3 of 3 slices shown]
[im 1/3]
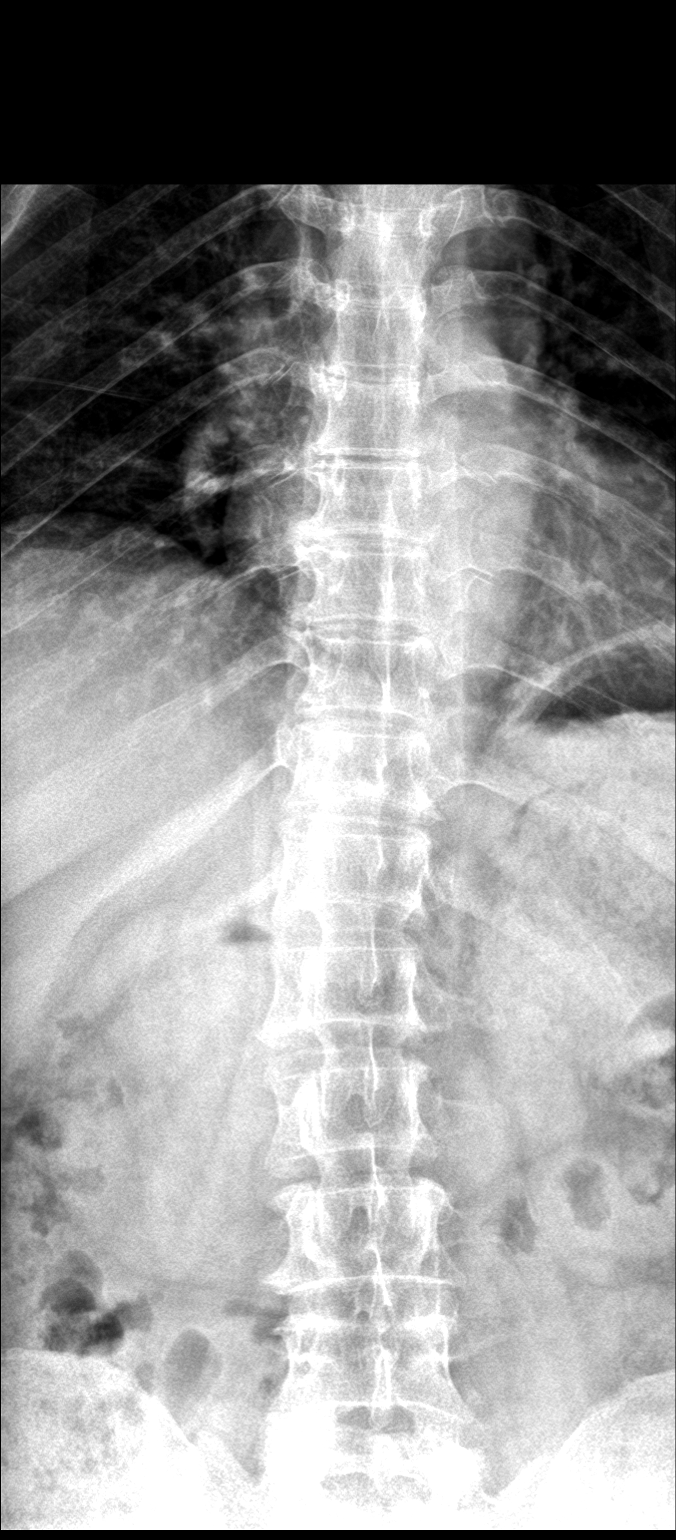
[im 2/3]
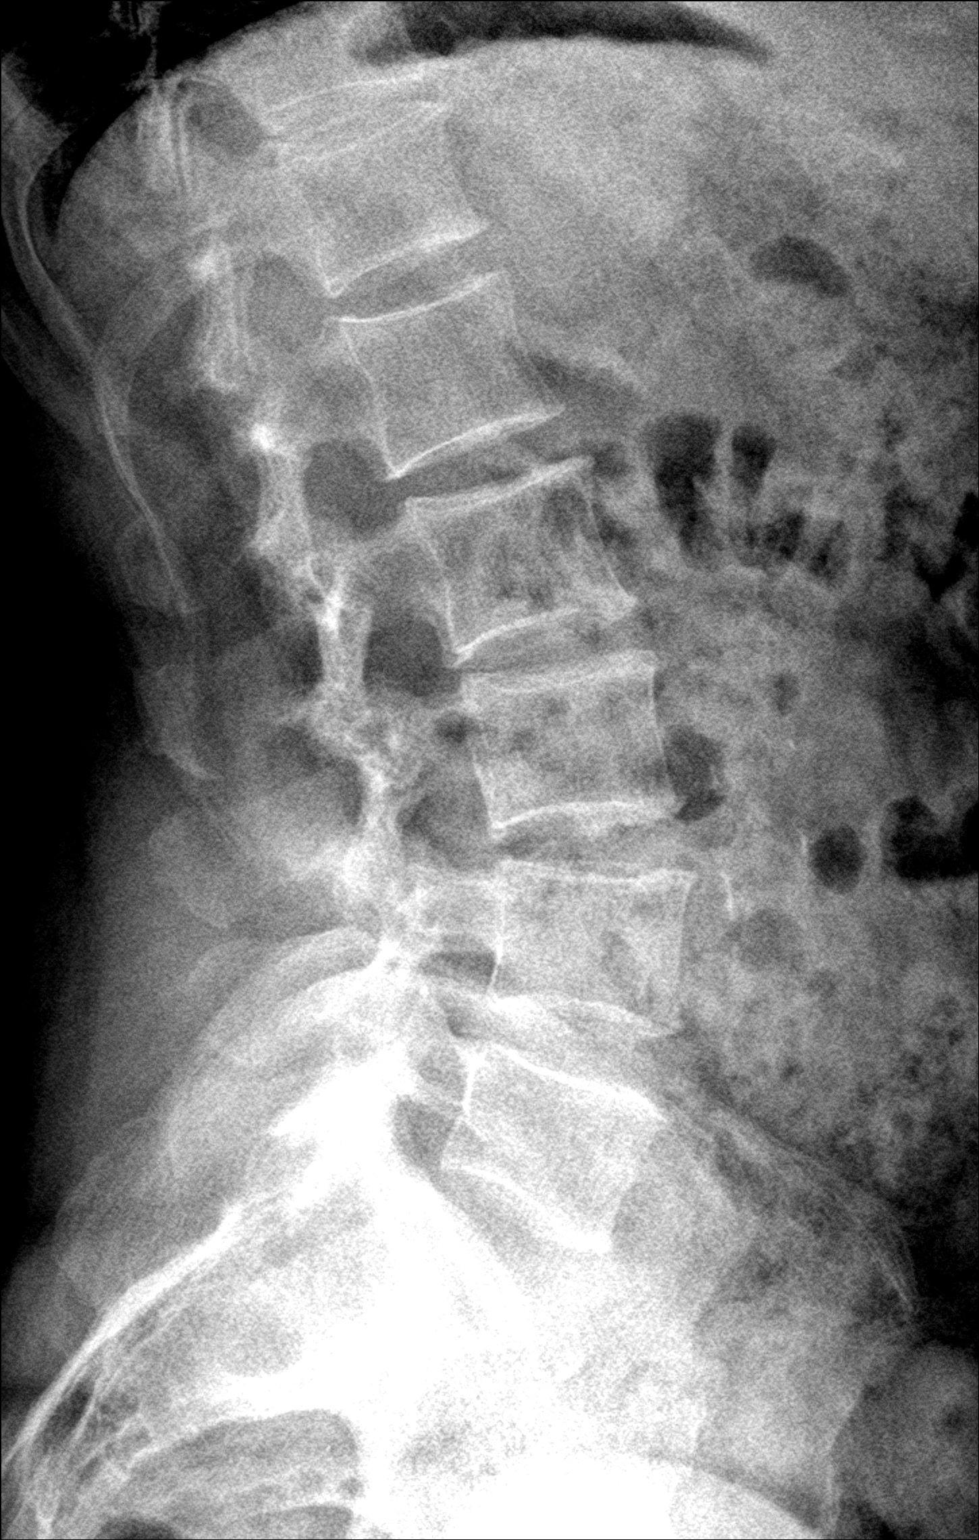
[im 3/3]
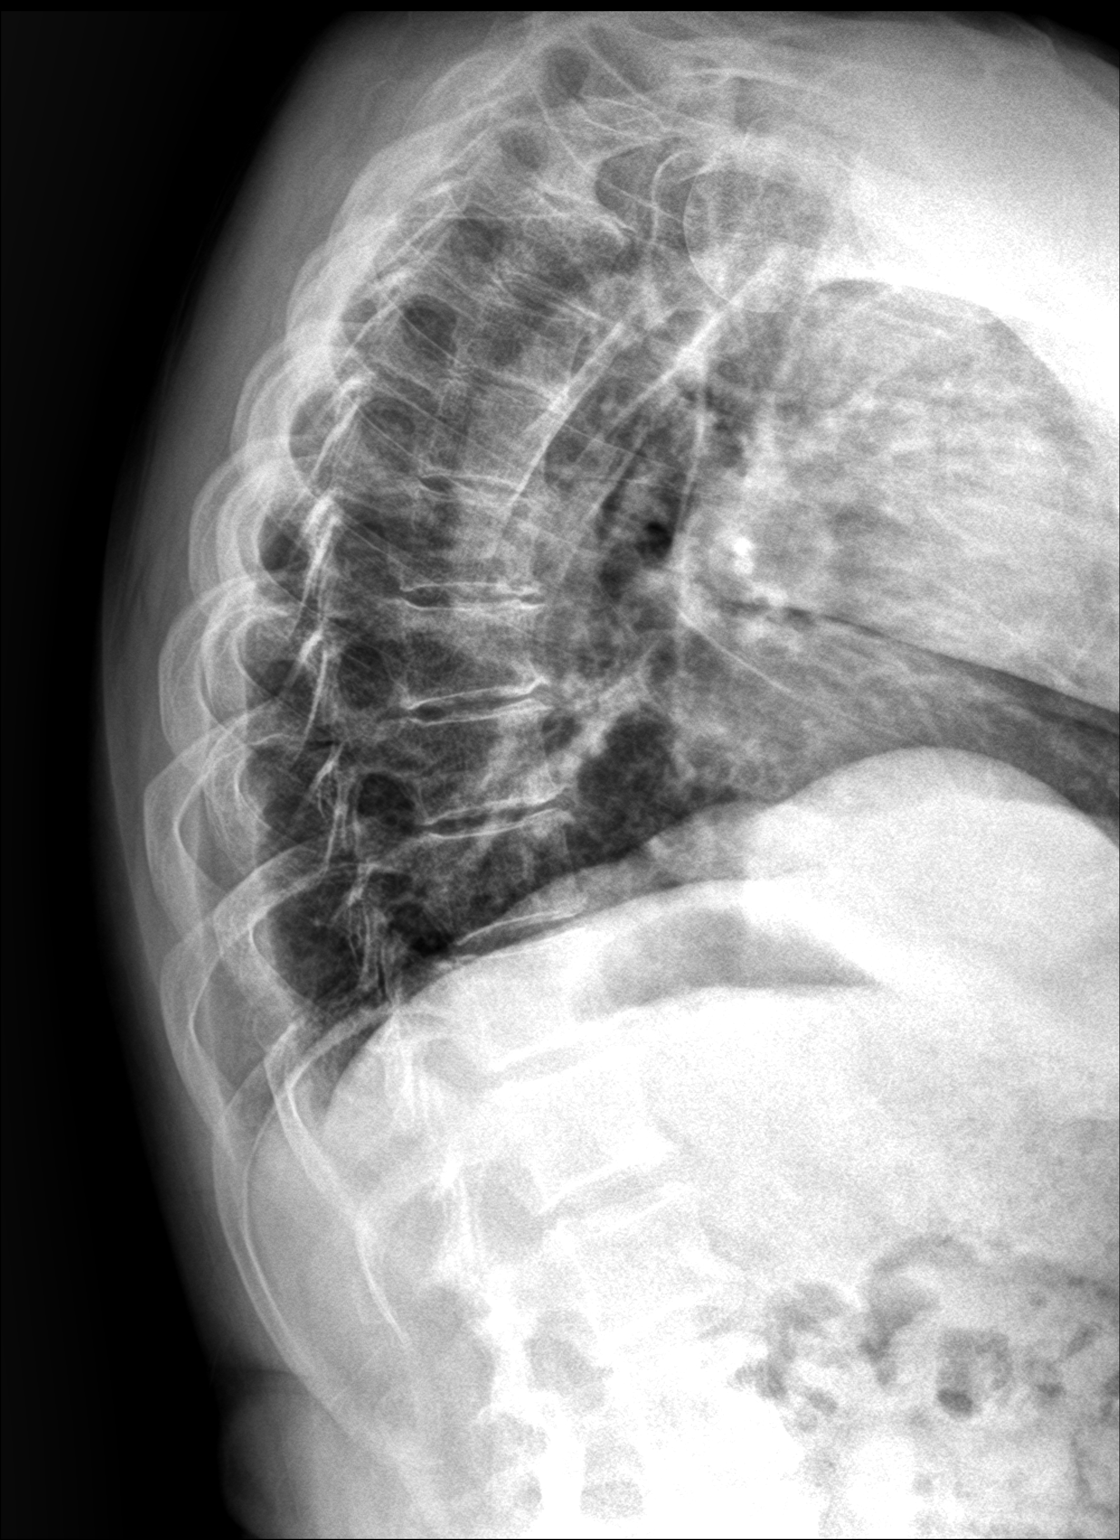

[3 of 3 positions shown; findings below may reference images not displayed]

RADIOGRAFIAS DA TRANSIÇÃO DORSO-LOMBAR

Corpos vertebrais alinhados com forma, dimensões e transparência radiográfica usuais.
Osteofitose marginal.
Redução de alguns espaços discais dorsais médios e inferiores.
Arcos posteriores sem alterações.

## 2023-04-06 IMAGING — CR RX COLUNA LOMBO SACRA
1 series · 2 of 2 positions shown · non-contrast
Comparison: none

[Series 1: antero-posterior (ap) · 0.14mm/px · 2 of 2 slices shown]
[im 1/2]
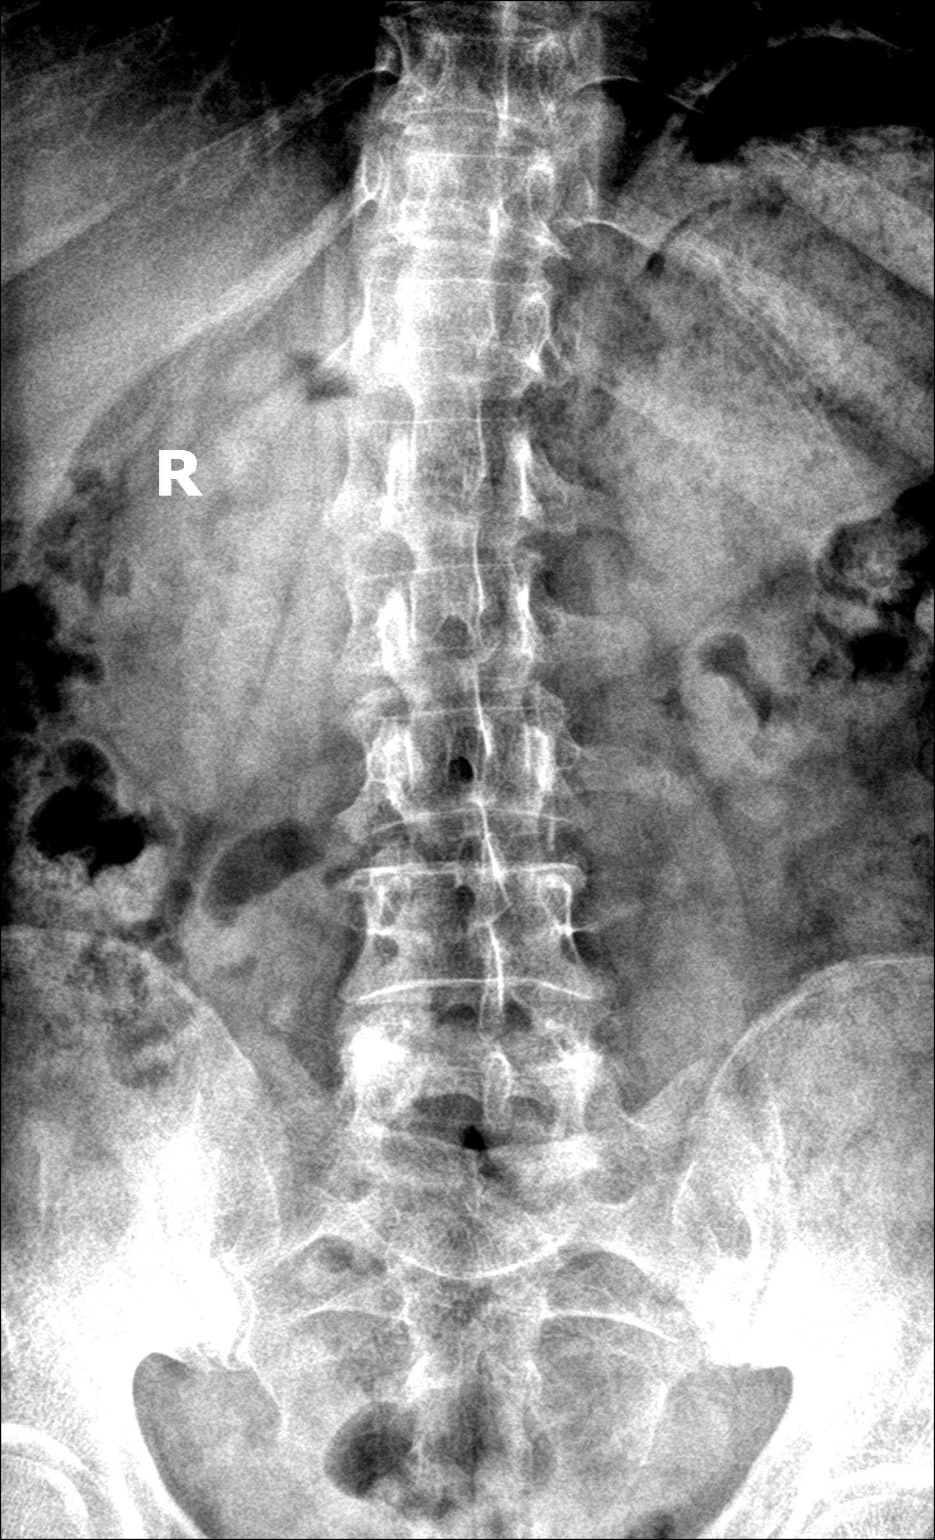
[im 2/2]
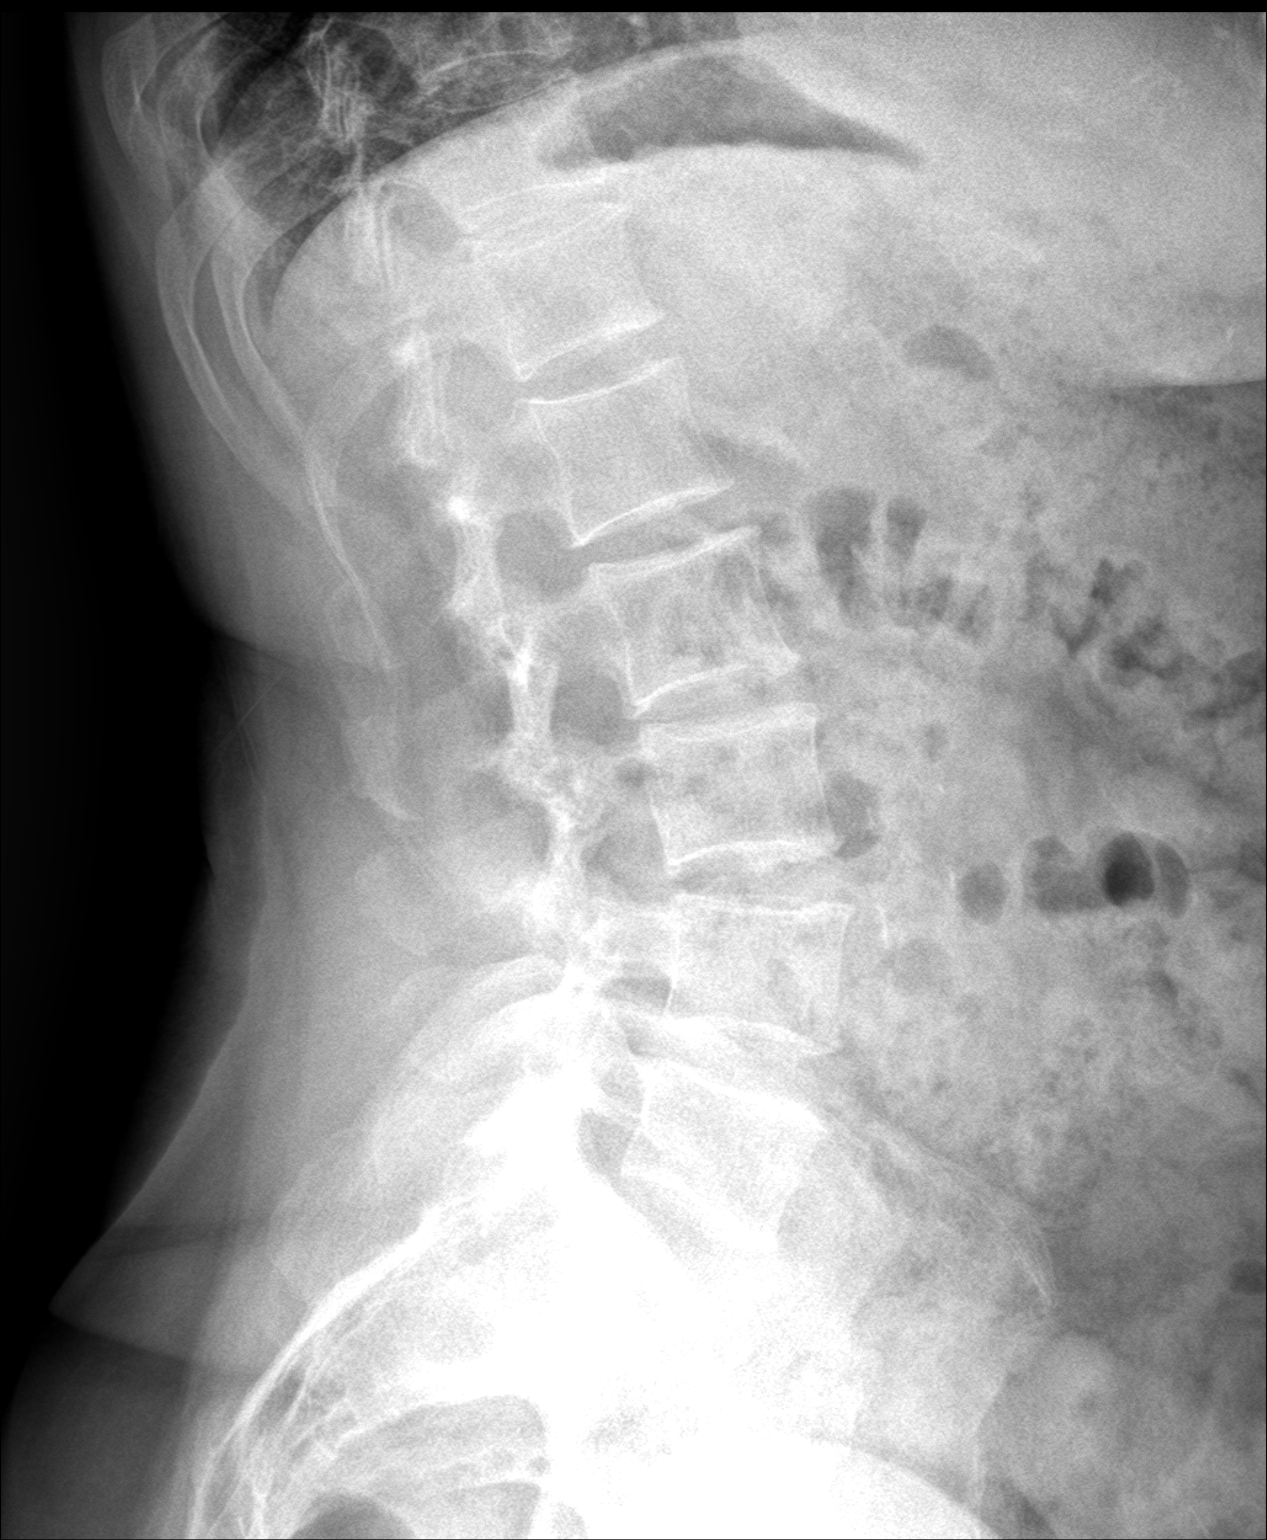

[2 of 2 positions shown; findings below may reference images not displayed]

RADIOGRAFIAS - COLUNA LOMBO-SACRA AP/PERFIL

Corpos vertebrais alinhados com forma, dimensões e transparência radiográfica usuais.
Osteofitose marginal.
Espaços intervertebrais conservados.
Arcos posteriores sem alterações.

## 2023-04-14 ENCOUNTER — Telehealth (HOSPITAL_BASED_OUTPATIENT_CLINIC_OR_DEPARTMENT_OTHER): Payer: Self-pay | Admitting: Internal Medicine

## 2023-04-14 NOTE — Telephone Encounter (Signed)
benefit analysis for the shingrix Vaccine.  The vaccine is covered under the patient's [place] medical coverage.     Please choose Private

## 2023-04-15 ENCOUNTER — Ambulatory Visit: Payer: 59 | Attending: Internal Medicine

## 2023-04-15 ENCOUNTER — Other Ambulatory Visit: Payer: Self-pay

## 2023-04-15 ENCOUNTER — Ambulatory Visit
Admission: RE | Admit: 2023-04-15 | Discharge: 2023-04-15 | Disposition: A | Discharge status: Home / Self Care | Payer: 59 | Attending: Diagnostic Radiology | Admitting: Diagnostic Radiology

## 2023-04-15 ENCOUNTER — Encounter (HOSPITAL_BASED_OUTPATIENT_CLINIC_OR_DEPARTMENT_OTHER): Payer: Self-pay

## 2023-04-15 DIAGNOSIS — Z1231 Encounter for screening mammogram for malignant neoplasm of breast: Secondary | ICD-10-CM | POA: Insufficient documentation

## 2023-04-15 DIAGNOSIS — F3340 Major depressive disorder, recurrent, in remission, unspecified: Secondary | ICD-10-CM

## 2023-04-15 DIAGNOSIS — Z23 Encounter for immunization: Secondary | ICD-10-CM | POA: Insufficient documentation

## 2023-04-15 NOTE — Progress Notes (Signed)
04/15/2023  VIS given prior to administration and reviewed with the patient and or legal guardian. Patient understands the disease and the vaccine. See immunization/Injection module or chart review for date of publication and additional information.  Brandt Loosen, LPN

## 2023-05-02 ENCOUNTER — Ambulatory Visit
Admission: RE | Admit: 2023-05-02 | Discharge: 2023-05-02 | Disposition: A | Discharge status: Home / Self Care | Payer: 59 | Attending: Diagnostic Radiology | Admitting: Diagnostic Radiology

## 2023-05-02 ENCOUNTER — Ambulatory Visit (HOSPITAL_BASED_OUTPATIENT_CLINIC_OR_DEPARTMENT_OTHER)
Admission: RE | Admit: 2023-05-02 | Discharge: 2023-05-02 | Disposition: A | Discharge status: Home / Self Care | Payer: 59 | Source: Ambulatory Visit

## 2023-05-02 ENCOUNTER — Other Ambulatory Visit: Payer: Self-pay

## 2023-05-02 ENCOUNTER — Other Ambulatory Visit (HOSPITAL_BASED_OUTPATIENT_CLINIC_OR_DEPARTMENT_OTHER): Payer: Self-pay | Admitting: Internal Medicine

## 2023-05-02 ENCOUNTER — Ambulatory Visit (HOSPITAL_BASED_OUTPATIENT_CLINIC_OR_DEPARTMENT_OTHER): Payer: 59 | Admitting: Internal Medicine

## 2023-05-02 ENCOUNTER — Encounter (HOSPITAL_BASED_OUTPATIENT_CLINIC_OR_DEPARTMENT_OTHER): Payer: Self-pay | Admitting: Internal Medicine

## 2023-05-02 VITALS — BP 115/74 | HR 78 | Temp 97.4°F | Wt 146.0 lb

## 2023-05-02 DIAGNOSIS — M419 Scoliosis, unspecified: Secondary | ICD-10-CM | POA: Diagnosis present

## 2023-05-02 DIAGNOSIS — M542 Cervicalgia: Secondary | ICD-10-CM | POA: Diagnosis present

## 2023-05-02 DIAGNOSIS — M47816 Spondylosis without myelopathy or radiculopathy, lumbar region: Secondary | ICD-10-CM | POA: Diagnosis present

## 2023-05-02 DIAGNOSIS — M545 Low back pain, unspecified: Secondary | ICD-10-CM

## 2023-05-02 DIAGNOSIS — G8929 Other chronic pain: Secondary | ICD-10-CM

## 2023-05-02 DIAGNOSIS — M47812 Spondylosis without myelopathy or radiculopathy, cervical region: Secondary | ICD-10-CM | POA: Insufficient documentation

## 2023-05-02 NOTE — Progress Notes (Signed)
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     Depression  Stable on 40 mg of fluoxetine for now  Please see PHQ-9 for additional details.   She does not report thoughts of death, suicidal thoughts or suicide attempts     BP 115/74 (Site: RA, Position: Sitting, Cuff Size: Reg)   Pulse 78   Temp 97.4 F (36.3 C) (Temporal)   Wt 66.2 kg (146 lb)   LMP 11/02/2009   SpO2 96%   BMI 26.70 kg/m   The neck is supple and free of adenopathy or masses, the thyroid is normal without enlargement or nodules.   General: seated comfortable in NAD, able to get onto exam table without difficulty  Back: no limited ROM to forward flexion, no pain with extension; no erythema or warmth, no tenderness to palpation of paraspinal muscles or spinal processes   Lower extremity neuro exam: strength 5/5 at knees, ankles and large toes; equal bilaterally;  sensation intact to pinprick in L4 (region of anterior knee), L5 (calf) and S1 (calf and lateral foot ) dermatomes of lower leg & feet bilaterally; reflexes 2+ knees & 1+ ankles bilaterally  ipsilateral and contralateral straight leg raise were normal, did not produce pain radiating below the knee    ASSESSMENT & PLAN:  (M54.50,  G89.29) Chronic bilateral low back pain without sciatica  (primary encounter  diagnosis)  (M54.2,  G89.29) Chronic neck pain  Comment: Given that she is > 33 years old, has had no alarm symptoms, we will pursue conservative therapy at this time along w/ PT & check plain films  We reviewed back care in detail and I have especially emphasized the importance of returning to normal activities as soon as possible.   No need for pain meds - as it's more a bother than an acute pain for her at this point.  Plan: REFERRAL TO PHYSICAL THERAPY (INT)          Depression   She feels comfortable with the 40 mg of fluoxetine - will continue    The patient was ready to learn and no apparent learning or adherence barriers were identified. I explained the diagnosis and treatment plan, and the patient expressed understanding of the content. I attempted to answer any questions regarding the diagnosis and the proposed treatment.    Possible side effects of the prescribed medication was explained. We discussed the patient's current medications.  We discussed the importance of medication compliance. The patient expressed understanding and no barriers to adherence were identified.    follow-up will be scheduled for 1 month from now for repeat thyroid tests (lab not open today)  she has been advised to call or return with any worsening or new problems    30  minutes was spent on the day of the visit on some or all of the following activities:   Preparing to see the patient (eg, review of tests)   Obtaining and/or reviewing separately obtained history   Performing a medically appropriate examination and/or evaluation   Counseling and educating the patient/family/caregiver   Ordering medications, tests, or procedures   Referring and communicating with other health care professionals    Documenting clinical information in the electronic or other health record   Independently interpreting results and  communicating results to the patient/family/caregiver   Care coordination

## 2023-05-03 ENCOUNTER — Ambulatory Visit (HOSPITAL_BASED_OUTPATIENT_CLINIC_OR_DEPARTMENT_OTHER): Payer: 59 | Admitting: Internal Medicine

## 2023-05-04 ENCOUNTER — Telehealth (HOSPITAL_BASED_OUTPATIENT_CLINIC_OR_DEPARTMENT_OTHER): Payer: Self-pay | Admitting: Internal Medicine

## 2023-05-04 NOTE — Telephone Encounter (Signed)
Called patient with interpreter [phone] unable to reach left message to call clinic.

## 2023-05-04 NOTE — Telephone Encounter (Signed)
Hi RN's    Please let her know we received her xray results  Lower back looks fine  She does have a lot of wear-and-tear/normal aging arthritis in her neck, this could certainly contribute to her pain, first step is PT as we discussed  But if the pain continues in ~3 months or so, then should call back to get a referral to physiatrist    If she notices any new symptoms - such as weakness in her hands - keep [place] posted in a timely fashion  Thanks, [employer]

## 2023-05-05 NOTE — Telephone Encounter (Signed)
Called patient with interpreter [phone] confirmed name and DOB. Relayed message. Patient agrees with plan.

## 2023-05-05 NOTE — Telephone Encounter (Signed)
Patient is returning nurses call.

## 2023-05-06 IMAGING — MR COLUNAS cn^COLUNA DORSAL
4 of 7 series · 21 of 48 positions shown · non-contrast
Comparison: none

[Series 2: T2 · sagittal · 5.0mm · 1.19mm/px · 1 of 3 slices shown (1 of 4)]
[im 1/3]
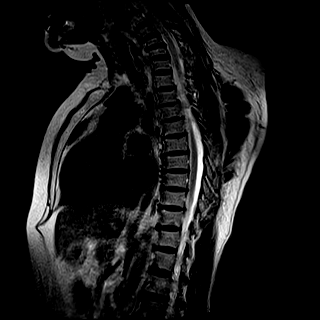

[Series 4: T2 · sagittal · 3.5mm · 0.74mm/px · 8 of 15 slices shown (2 of 4)]
[im 1/15]
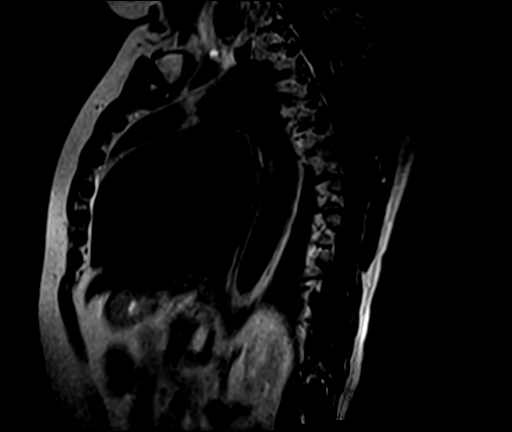
[im 3/15]
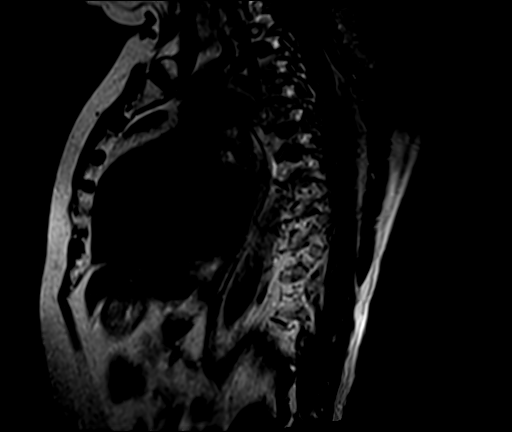
[im 5/15]
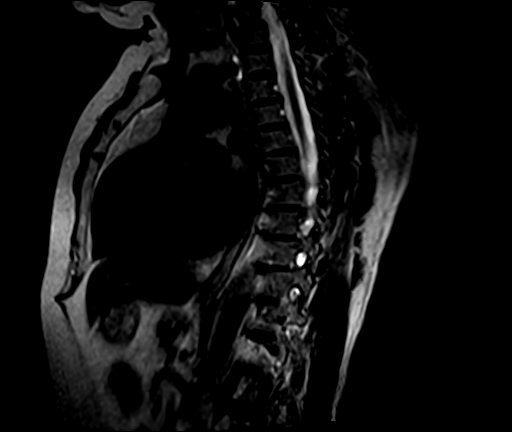
[im 7/15]
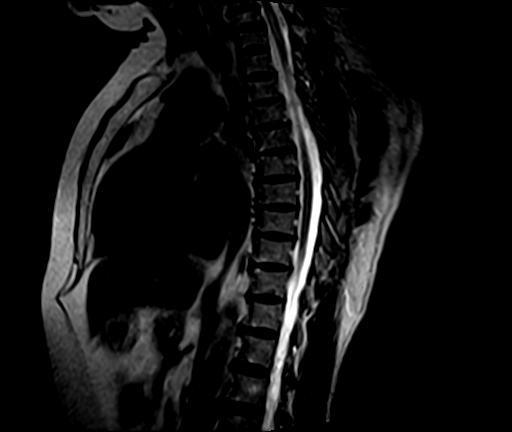
[im 9/15]
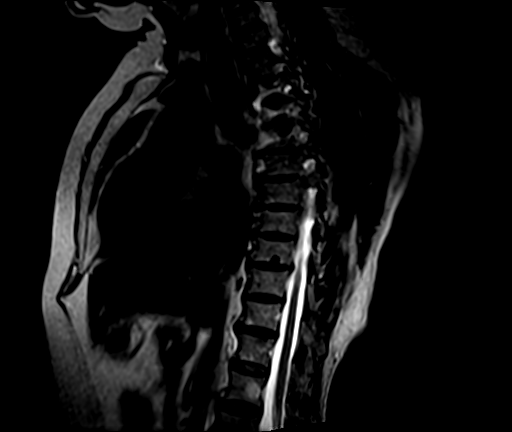
[im 11/15]
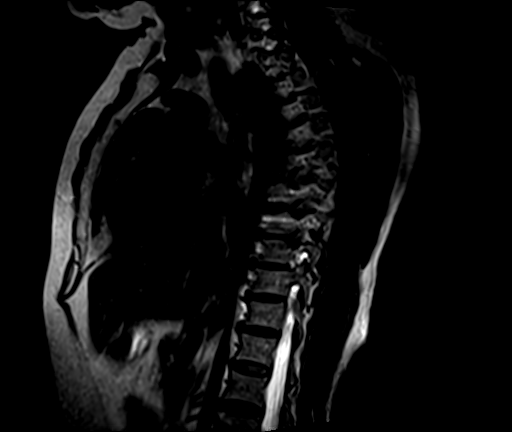
[im 13/15]
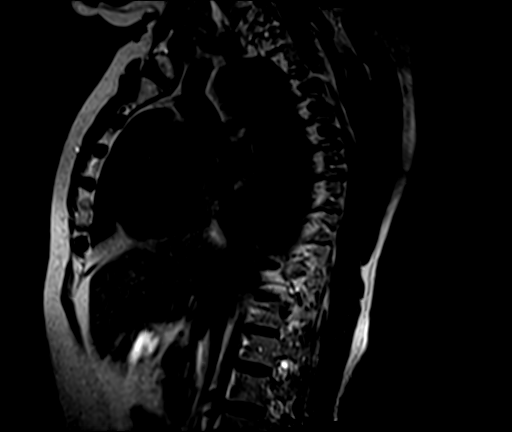
[im 15/15]
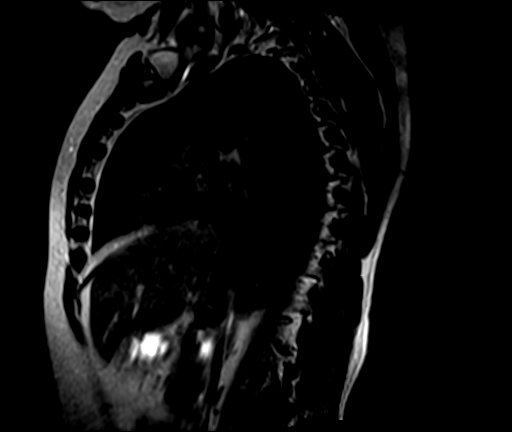

[Series 7: T2 · axial · 4.0mm · 0.55mm/px · z∈[-151,+14]mm · 9 of 24 slices shown (3 of 4)]
[im 1/24]
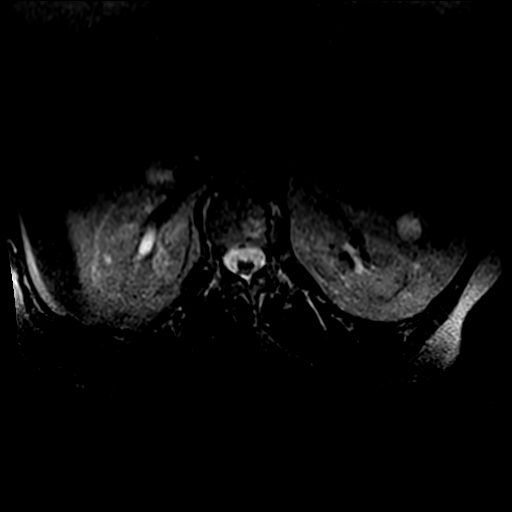
[im 4/24]
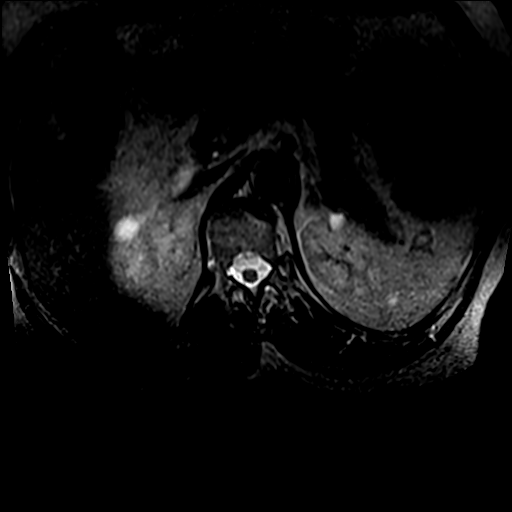
[im 8/24]
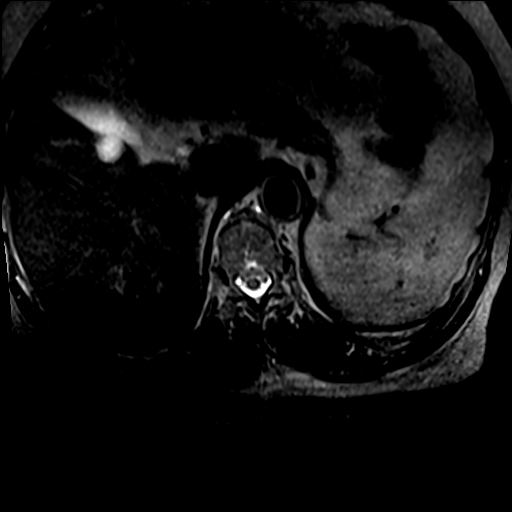
[im 10/24]
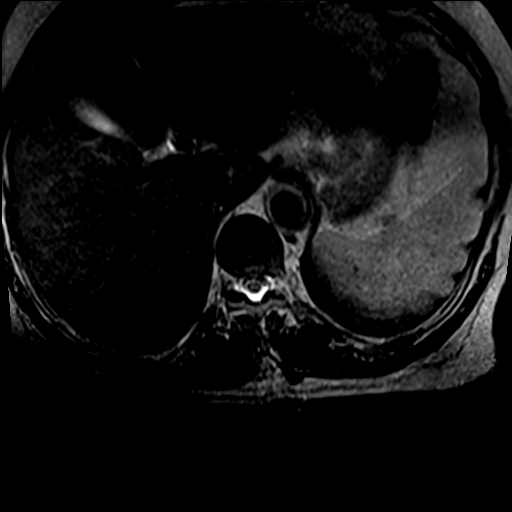
[im 12/24]
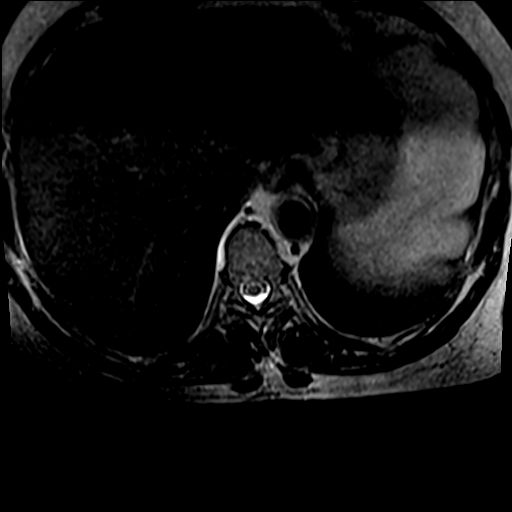
[im 14/24]
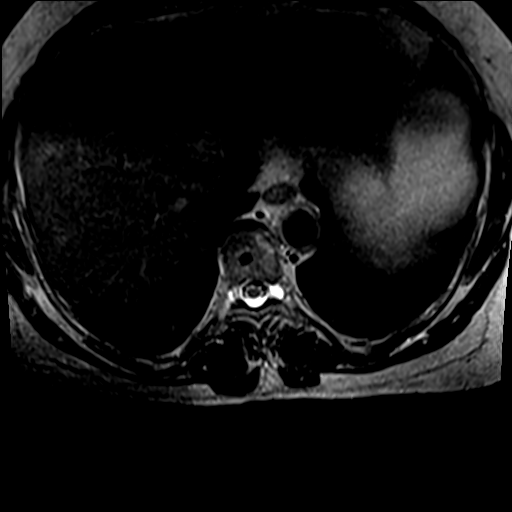
[im 16/24]
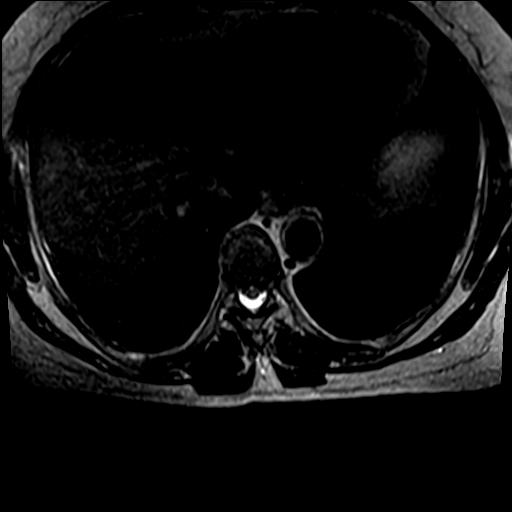
[im 20/24]
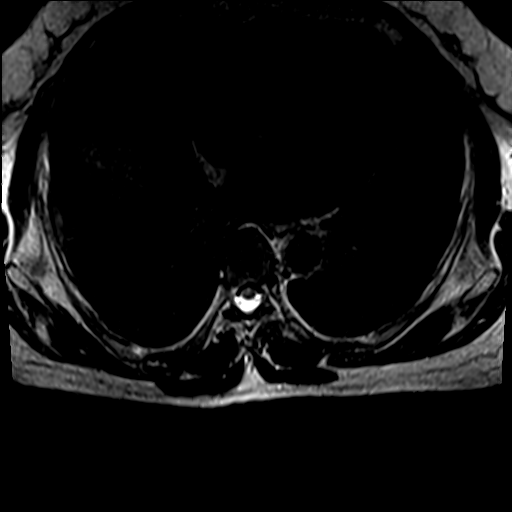
[im 24/24]
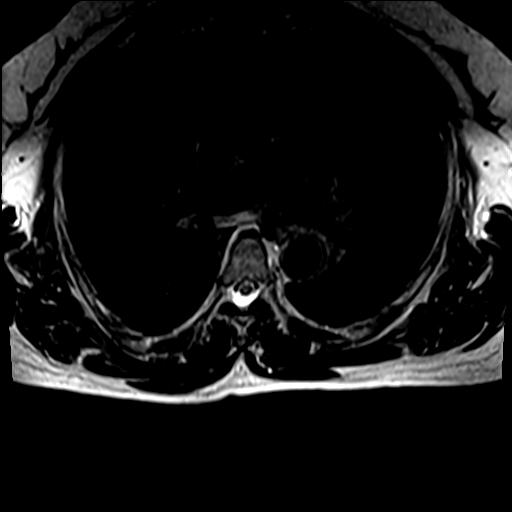

[Series 8: T2 · coronal · 4.0mm · 0.62mm/px · 3 of 15 slices shown (4 of 4)]
[im 3/15]
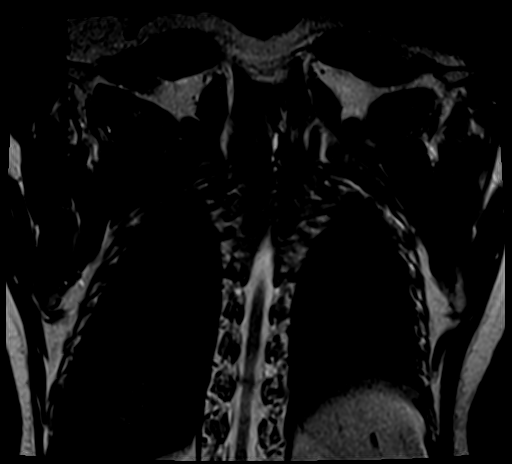
[im 9/15]
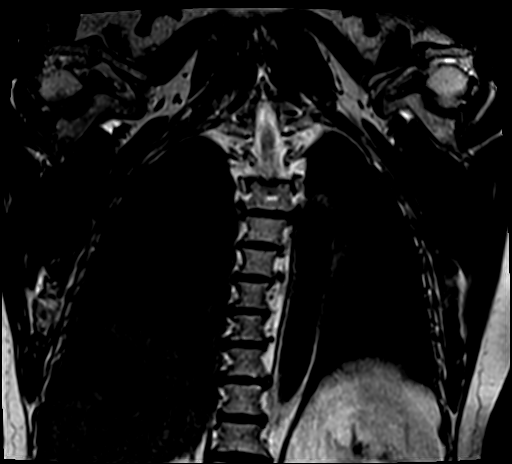
[im 13/15]
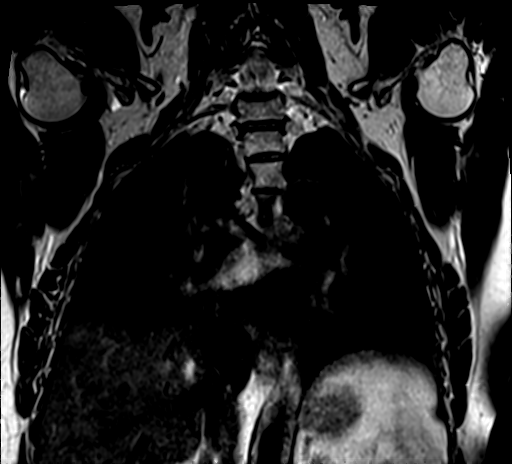

[21 of 48 positions shown; findings below may reference images not displayed]

RESSONÂNCIA MAGNÉTICA DA COLUNA TORÁCICA
TÉCNICA:
Exame realizado em equipamento de ressonância magnética com sequências, ponderações e planos específicos para o segmento de interesse.

RESULTADO:
Bom alinhamento dos corpos vertebrais torácicos.
Desvio lateral da coluna torácica com convexidade para a esquerda, no decúbito.
Acentuação da cifose torácica em decúbito.
Corpos vertebrais com alturas preservadas.
Osteofitose nos corpos vertebrais torácicos.
Pedículos, lâminas, processos transversos e espinhosos preservados.
Articulações interapofisárias preservadas.
Nódulo de Schmorl no planalto vertebral inferior de D9.
Sinais de desidratação discal difusa.
Abaulamentos discais simétricos em D1-D2 a D12-L1, comprimindo a face ventral do saco dural.
Demais discos intervertebrais sem alterações significativas.
Canal vertebral e forames intervertebrais com amplitude preservada nos demais segmentos.
Espaço liquórico livre nos demais segmentos.
Raízes nervosas foraminais preservadas.
Medula com contornos regulares e sinal homogêneo.
Partes moles paravertebrais com aspecto preservado.

CONCLUSÃO:
Espondilose torácica.
Discopatia degenerativa acima descrita.

## 2023-05-06 IMAGING — MR COLUNAS cn^COLUNA LOMBAR
5 of 6 series · 37 of 48 positions shown · non-contrast
Comparison: none

[Series 2: T2 · sagittal · 4.0mm · 1.12mm/px · 9 of 14 slices shown (1 of 4)]
[im 1/14]
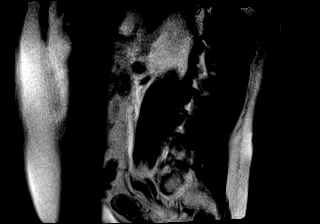
[im 2/14]
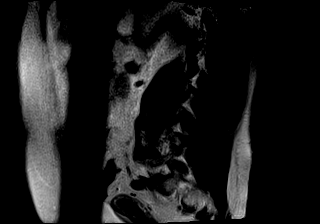
[im 4/14]
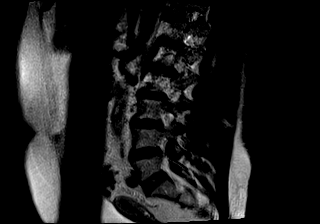
[im 5/14]
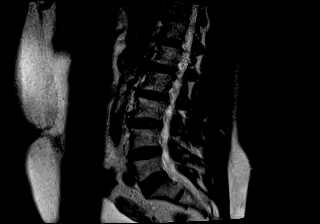
[im 7/14]
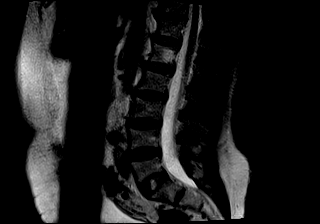
[im 9/14]
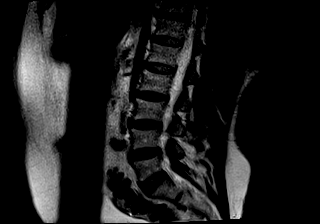
[im 10/14]
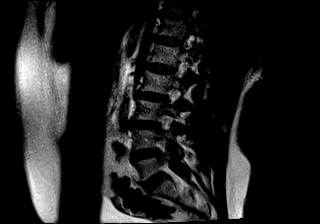
[im 12/14]
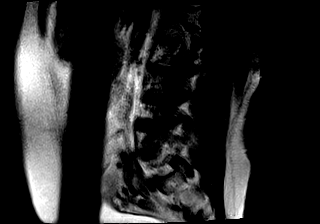
[im 14/14]
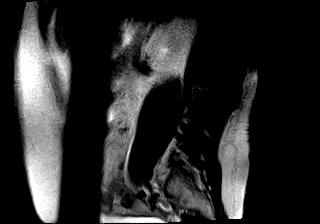

[Series 3: T1 · sagittal · 4.0mm · 1.12mm/px · 8 of 14 slices shown]
[im 1/14]
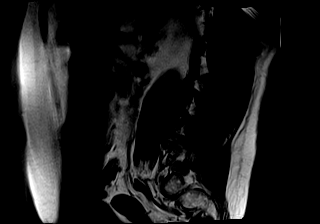
[im 2/14]
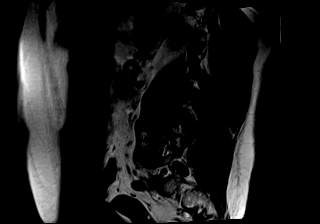
[im 4/14]
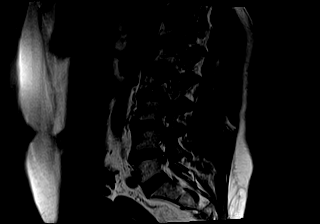
[im 5/14]
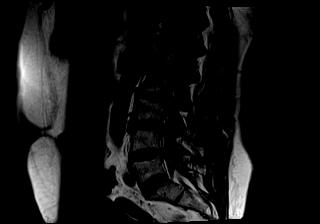
[im 9/14]
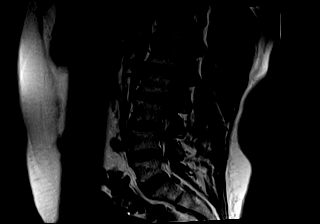
[im 10/14]
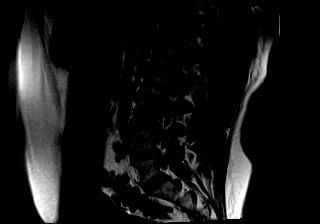
[im 12/14]
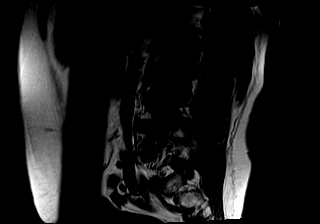
[im 14/14]
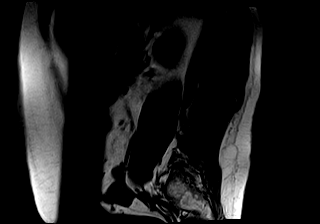

[Series 4: T2 · axial · 4.0mm · 0.55mm/px · z∈[-97,+130]mm · 10 of 20 slices shown (2 of 4)]
[im 1/20]
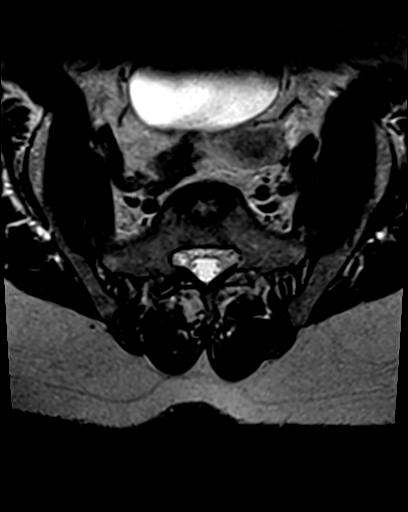
[im 2/20]
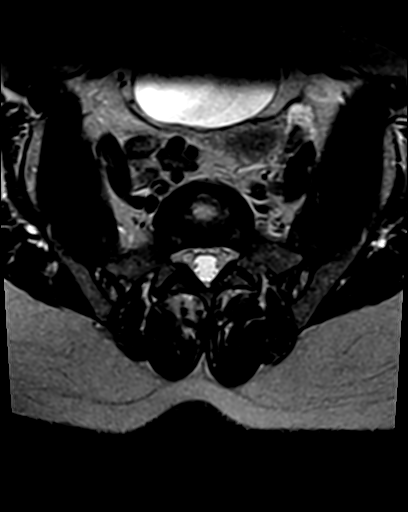
[im 4/20]
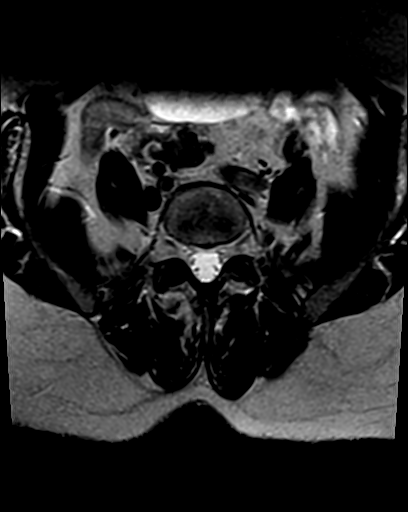
[im 7/20]
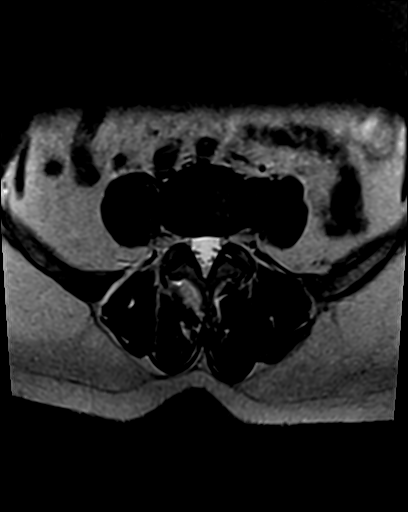
[im 8/20]
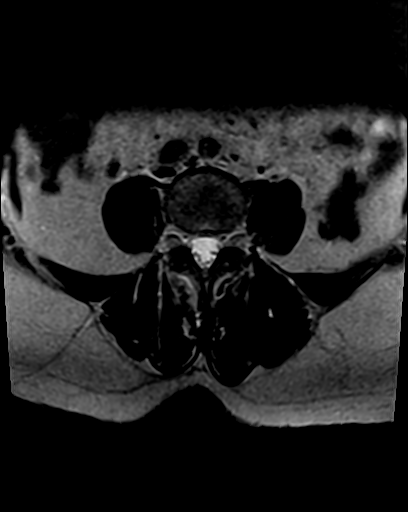
[im 10/20]
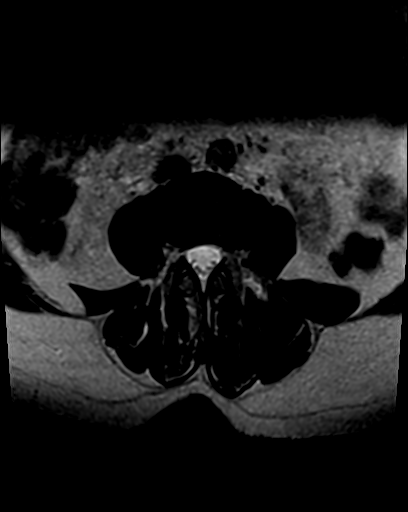
[im 12/20]
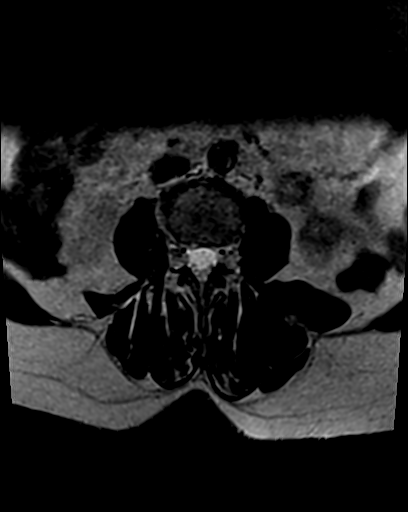
[im 13/20]
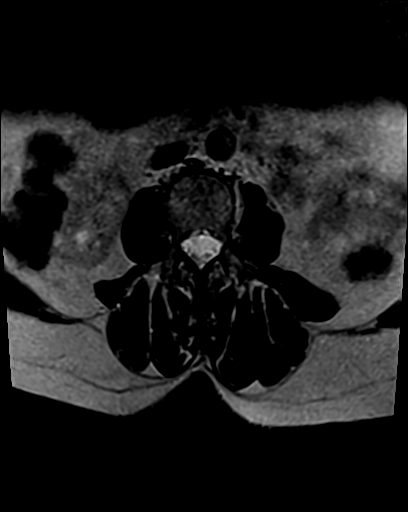
[im 16/20]
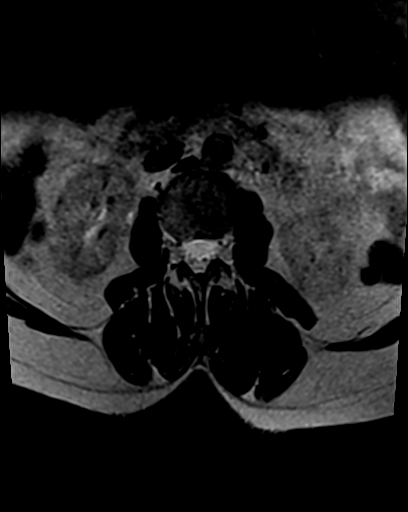
[im 20/20]
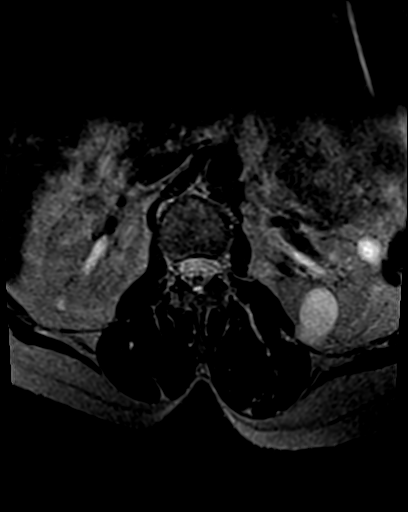

[Series 5: T2 · coronal · 4.0mm · 0.66mm/px · 8 of 16 slices shown (3 of 4)]
[im 1/16]
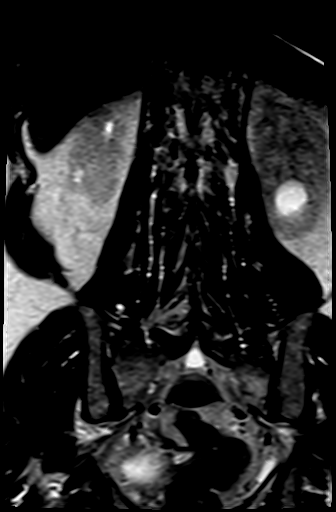
[im 2/16]
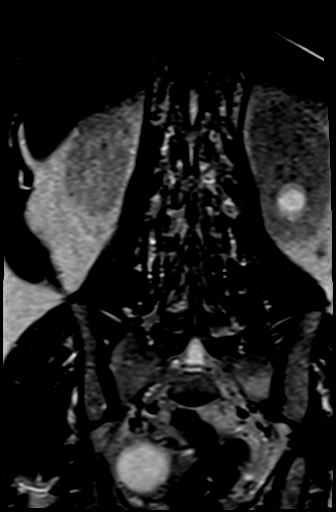
[im 6/16]
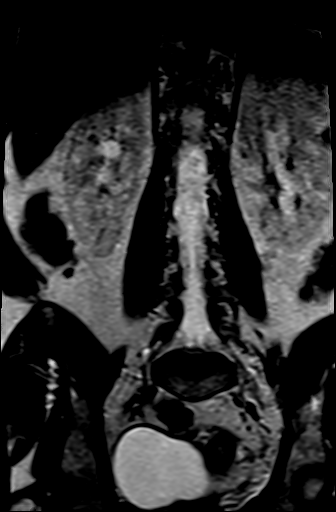
[im 7/16]
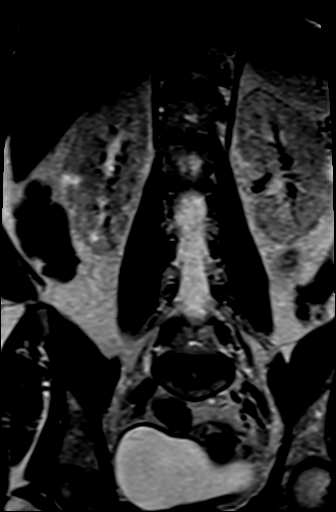
[im 9/16]
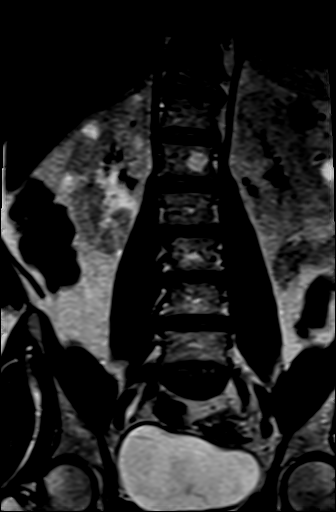
[im 11/16]
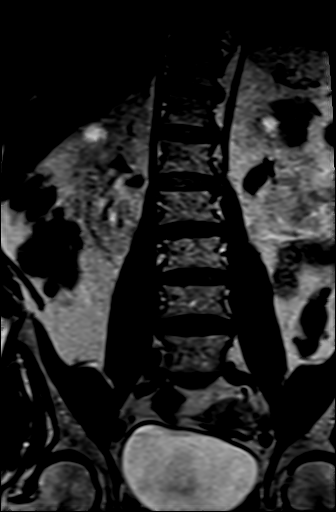
[im 14/16]
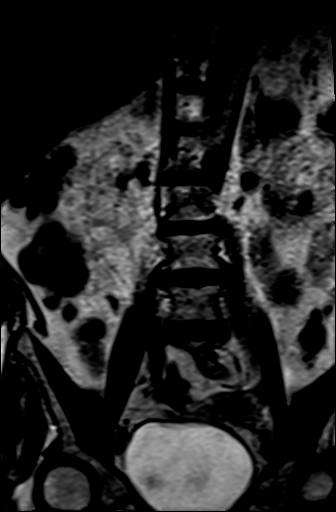
[im 16/16]
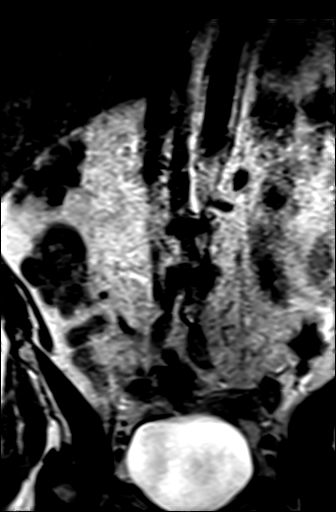

[Series 5001: T2 · axial · 10.0mm · 1.56mm/px · z∈[+0,+194]mm · 2 of 3 slices shown (4 of 4)]
[im 1/3]
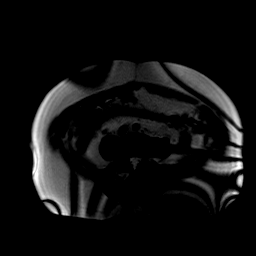
[im 3/3]
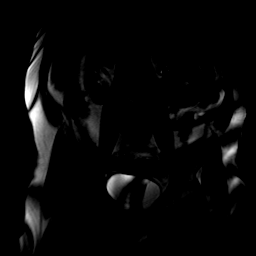

[37 of 48 positions shown; findings below may reference images not displayed]

RESSONÂNCIA MAGNÉTICA DA COLUNA LOMBOSSACRA
TÉCNICA:
Realizadas sequências FSE (fast-spin-eco), ponderadas em T1 e T2 nos planos sagital e axial.

RESULTADO:
Bom alinhamento dos corpos vertebrais lombares.
Acentuação da lordose lombar em decúbito.
Corpos vertebrais com alturas preservadas.
Osteofitose nos corpos vertebrais lombares.
Pedículos, lâminas, processos transversos e espinhosos preservados.
Artrose interfacetária em L5-S1.
Focos de hipersinal em T1 e T2 nos corpos vertebrais de L1 e L3, podendo corresponder a hemangiomas ou ainda deposições gordurosas focais.
Alteração degenerativa tipo Modic II (degeneração gordurosa) nos planaltos vertebrais superiores de L3 e L4.
Sinais de desidratação discal em L2-L3 a L4-L5.

Nível L2-L3: Abaulamento discal simétrico , retificando a face ventral do saco dural, sem conflitos radiculares.
Nível L3-L4: Abaulamento discal simétrico , retificando a face ventral do saco dural, sem conflitos radiculares.
Nível L4-L5: Abaulamento discal simétrico , retificando a face ventral do saco dural, sem conflitos radiculares.

Demais discos intervertebrais sem alterações significativas.
Canal vertebral e forames intervertebrais com amplitude preservada nos demais segmentos.
Espaço liquórico livre nos demais segmentos.
Raízes nervosas foraminais preservadas.
Cone medular com contornos regulares e sinal homogêneo.
Partes moles paravertebrais com aspecto preservado.

CONCLUSÃO:
Espondiloartropatia degenerativa lombar.
Discopatia degenerativa acima descrita.

## 2023-05-09 ENCOUNTER — Other Ambulatory Visit (HOSPITAL_BASED_OUTPATIENT_CLINIC_OR_DEPARTMENT_OTHER): Payer: Self-pay | Admitting: Student in an Organized Health Care Education/Training Program

## 2023-05-09 DIAGNOSIS — J301 Allergic rhinitis due to pollen: Secondary | ICD-10-CM

## 2023-05-10 ENCOUNTER — Encounter (HOSPITAL_BASED_OUTPATIENT_CLINIC_OR_DEPARTMENT_OTHER): Payer: Self-pay | Admitting: Internal Medicine

## 2023-05-10 DIAGNOSIS — J301 Allergic rhinitis due to pollen: Secondary | ICD-10-CM

## 2023-05-11 ENCOUNTER — Encounter (HOSPITAL_BASED_OUTPATIENT_CLINIC_OR_DEPARTMENT_OTHER): Payer: Self-pay | Admitting: Internal Medicine

## 2023-05-11 DIAGNOSIS — J301 Allergic rhinitis due to pollen: Secondary | ICD-10-CM

## 2023-05-11 MED ORDER — ALBUTEROL SULFATE HFA 108 (90 BASE) MCG/ACT IN AERS
2.0000 | INHALATION_SPRAY | RESPIRATORY_TRACT | 3 refills | Status: DC | PRN
Start: 2023-05-11 — End: 2024-07-27

## 2023-05-11 NOTE — Telephone Encounter (Signed)
From: Butler Denmark  To: Office of Greer Ee  Sent: 05/10/2023 8:09 PM EDT  Subject: Medication Renewal Request    Refills have been requested for the following medications:   Other - Albuterol Inhaler    Preferred pharmacy: [facility] [phone] - [place], [place] - [address] AT .

## 2023-05-11 NOTE — Telephone Encounter (Signed)
Per Patient (self), Loretta Martin is a 62 year old female has requested a refill of       ALBUTEROL INHALER.       Last Office Visit: 05/02/2023 with PCP  Last Physical Exam:      There are no preventive care reminders to display for this patient.    Other Med Adult:  Most Recent BP Reading(s)  05/02/23 : 115/74        Cholesterol (mg/dL)   Date Value   16/07/9603 255 (*H)     LOW DENSITY LIPOPROTEIN DIRECT (mg/dL)   Date Value   54/06/8118 174     HIGH DENSITY LIPOPROTEIN (mg/dL)   Date Value   14/78/2956 60     TRIGLYCERIDES (mg/dL)   Date Value   21/30/8657 60         THYROID SCREEN TSH REFLEX FT4 (uIU/mL)   Date Value   05/07/2022 6.100 (H)         TSH (THYROID STIM HORMONE) (uIU/mL)   Date Value   02/07/2023 6.400 (H)       HEMOGLOBIN A1C (%)   Date Value   01/16/2020 5.3       No results found for: "POCA1C"      INR (no units)   Date Value   01/25/2020 1.1   05/12/2009 1.0 (L)       SODIUM (mmol/L)   Date Value   07/15/2022 143       POTASSIUM (mmol/L)   Date Value   07/15/2022 4.5           CREATININE (mg/dL)   Date Value   84/69/6295 0.8       Documented patient preferred pharmacies:    [id number] - [place],  - [address] .  Phone: [phone] Fax: [phone]

## 2023-05-17 ENCOUNTER — Other Ambulatory Visit (HOSPITAL_BASED_OUTPATIENT_CLINIC_OR_DEPARTMENT_OTHER): Payer: Self-pay | Admitting: Student in an Organized Health Care Education/Training Program

## 2023-05-17 DIAGNOSIS — J301 Allergic rhinitis due to pollen: Secondary | ICD-10-CM

## 2023-06-23 ENCOUNTER — Ambulatory Visit (HOSPITAL_BASED_OUTPATIENT_CLINIC_OR_DEPARTMENT_OTHER): Payer: Self-pay | Admitting: Registered Nurse

## 2023-06-23 NOTE — Telephone Encounter (Signed)
Reason for Disposition  . Patient wants to be seen    Answer Assessment - Initial Assessment Questions  Elouise Munroe, RN, 06/23/2023  TC from pt to [facility]  [place] speaking RN confirmed pt's name and DOB  Pt requesting an appt for a "long hx" of pimple like rash all over her body  Becoming more itchy  Unclear cause  Reports no known allergies to foods/detergents/soaps/perfumes  No other associated sx  Wants allergy testing    Scheduled Appointments    Jun 28, 2023 4:00 PM  (Arrive by 3:45 PM)  Established Patient with Lawson Fiscal, APRN  [facility]) [address]  [place] [address]  [phone]      Please arrive 15 minutes prior to your appointment for Registration   Process   Jul 04, 2023 8:00 AM  THERAPY EVALUATION 60 MINUTES with Valora Corporal, PT  [facility]) [address]   [facility] [address]  [place] [address]  [phone]      Jul 04, 2023 9:20 AM  (Arrive by 9:05 AM)  LAB with [facility]  [facility] [place] [address] [facility] [address]) [address]   [place] [address]  [phone]   Reminder: All patients MUST still go to Registration on the day of their   visit even if they have used eCheck-In for their visit.     Please arrive 15 minutes prior to your appointment for Registration   Process    Protocols used: Rash or Redness - Widespread-A-OH

## 2023-06-28 ENCOUNTER — Encounter (HOSPITAL_BASED_OUTPATIENT_CLINIC_OR_DEPARTMENT_OTHER): Payer: 59 | Admitting: Family Medicine

## 2023-07-04 ENCOUNTER — Ambulatory Visit: Payer: Self-pay | Attending: Internal Medicine

## 2023-07-04 ENCOUNTER — Encounter (HOSPITAL_BASED_OUTPATIENT_CLINIC_OR_DEPARTMENT_OTHER): Payer: Self-pay | Admitting: Internal Medicine

## 2023-07-04 ENCOUNTER — Ambulatory Visit (HOSPITAL_BASED_OUTPATIENT_CLINIC_OR_DEPARTMENT_OTHER): Payer: 59

## 2023-07-04 ENCOUNTER — Other Ambulatory Visit (HOSPITAL_BASED_OUTPATIENT_CLINIC_OR_DEPARTMENT_OTHER): Payer: Self-pay | Admitting: Internal Medicine

## 2023-07-04 DIAGNOSIS — E038 Other specified hypothyroidism: Secondary | ICD-10-CM | POA: Insufficient documentation

## 2023-07-04 LAB — TSH (THYROID STIMULATING HORMONE): TSH (THYROID STIM HORMONE): 5.87 u[IU]/mL — ABNORMAL HIGH (ref 0.270–4.200)

## 2023-07-04 NOTE — Progress Notes (Signed)
Labs drawn.   Loretta Martin, 07/04/2023

## 2024-01-16 ENCOUNTER — Telehealth (HOSPITAL_BASED_OUTPATIENT_CLINIC_OR_DEPARTMENT_OTHER): Payer: Self-pay

## 2024-01-16 NOTE — Telephone Encounter (Signed)
 Left message for patient to call to schedule Lab only appointment

## 2024-01-16 NOTE — Telephone Encounter (Signed)
-----   Message from Iris Mano sent at 01/16/2024  8:49 AM EDT -----  Hi Team,     Please call to let  know there is a blood test that I have ordered for  and  can get done as lab only visit & now it is important to have this completed.  Please help  schedule a lab visit.    Let me know if any issues, thanks, Wilda Handsome

## 2024-01-17 NOTE — Telephone Encounter (Signed)
 Called patient and scheduled appointment.

## 2024-01-27 ENCOUNTER — Ambulatory Visit
Admission: RE | Admit: 2024-01-27 | Discharge: 2024-01-27 | Disposition: A | Discharge status: Home / Self Care | Attending: Internal Medicine | Admitting: Internal Medicine

## 2024-01-27 ENCOUNTER — Other Ambulatory Visit: Payer: Self-pay

## 2024-01-27 ENCOUNTER — Telehealth (HOSPITAL_BASED_OUTPATIENT_CLINIC_OR_DEPARTMENT_OTHER): Payer: Self-pay

## 2024-01-27 DIAGNOSIS — E038 Other specified hypothyroidism: Secondary | ICD-10-CM | POA: Diagnosis present

## 2024-01-27 DIAGNOSIS — Z0000 Encounter for general adult medical examination without abnormal findings: Secondary | ICD-10-CM | POA: Diagnosis present

## 2024-01-27 LAB — TSH (THYROID STIMULATING HORMONE): TSH (THYROID STIM HORMONE): 5.5 u[IU]/mL — ABNORMAL HIGH (ref 0.270–4.200)

## 2024-01-27 NOTE — Telephone Encounter (Signed)
 Called patient and scheduled appointment. States that she is going to [place] on Sunday and will be back on 05/11

## 2024-01-27 NOTE — Telephone Encounter (Signed)
-----   Message from Iris Mano sent at 01/27/2024  1:28 PM EDT -----  Beverley Buck call patient to have a follow-up TV visit with me for "hypothyroidism labs" on next Weds 4/30should keep phone earlier that day available as I'll try to call earlier in the day   Much thanks,Pieter

## 2024-02-14 ENCOUNTER — Other Ambulatory Visit (HOSPITAL_BASED_OUTPATIENT_CLINIC_OR_DEPARTMENT_OTHER): Payer: Self-pay | Admitting: Internal Medicine

## 2024-02-14 MED ORDER — LEVOTHYROXINE SODIUM 25 MCG PO TABS
12.5000 ug | ORAL_TABLET | Freq: Every morning | ORAL | 3 refills | Status: DC
Start: 2024-02-14 — End: 2024-06-15

## 2024-02-14 NOTE — Telephone Encounter (Signed)
 PER Pharmacy, Loretta Martin is a 63 year old female has requested a refill of synthroid .      Last OFFICE/TELE Visit:  05/02/23 with pcp      Last Physical Exam: 3/20    There are no preventive care reminders to display for this patient.    Other Med Adult:  Most Recent BP Reading(s)  05/02/23 : 115/74        Cholesterol (mg/dL)   Date Value   16/07/9603 255 (*H)     LOW DENSITY LIPOPROTEIN DIRECT (mg/dL)   Date Value   54/06/8118 174     HIGH DENSITY LIPOPROTEIN (mg/dL)   Date Value   14/78/2956 60     TRIGLYCERIDES (mg/dL)   Date Value   21/30/8657 60         THYROID  SCREEN TSH REFLEX FT4 (uIU/mL)   Date Value   05/07/2022 6.100 (H)         TSH (THYROID  STIM HORMONE) (uIU/mL)   Date Value   01/27/2024 5.500 (H)       HEMOGLOBIN A1C (%)   Date Value   01/16/2020 5.3       No results found for: "POCA1C"      INR (no units)   Date Value   01/25/2020 1.1   05/12/2009 1.0 (L)       SODIUM (mmol/L)   Date Value   07/15/2022 143       POTASSIUM (mmol/L)   Date Value   07/15/2022 4.5           CREATININE (mg/dL)   Date Value   84/69/6295 0.8       Documented patient preferred pharmacies:    [facility] #[id number] - [place], [place] - [address] AT .  Phone: [phone] Fax: [phone]

## 2024-06-15 ENCOUNTER — Encounter (HOSPITAL_BASED_OUTPATIENT_CLINIC_OR_DEPARTMENT_OTHER): Payer: Self-pay | Admitting: Internal Medicine

## 2024-06-15 NOTE — Telephone Encounter (Signed)
 Patient would like RX   levothyroxine  (SYNTHROID ) 25 MCG tablet  pick up at   [facility] [address] [place], [place] [address]

## 2024-06-18 MED ORDER — LEVOTHYROXINE SODIUM 25 MCG PO TABS
12.5000 ug | ORAL_TABLET | Freq: Every morning | ORAL | 3 refills | Status: AC
Start: 2024-06-18 — End: 2025-06-18

## 2024-07-27 ENCOUNTER — Encounter (HOSPITAL_BASED_OUTPATIENT_CLINIC_OR_DEPARTMENT_OTHER): Payer: Self-pay | Admitting: Internal Medicine

## 2024-07-27 DIAGNOSIS — J301 Allergic rhinitis due to pollen: Secondary | ICD-10-CM

## 2024-07-27 MED ORDER — FLUOXETINE HCL 40 MG PO CAPS
40.0000 mg | ORAL_CAPSULE | Freq: Every day | ORAL | 11 refills | Status: AC
Start: 2024-07-27 — End: 2025-07-27

## 2024-07-27 MED ORDER — ALBUTEROL SULFATE HFA 108 (90 BASE) MCG/ACT IN AERS
2.0000 | INHALATION_SPRAY | RESPIRATORY_TRACT | 3 refills | Status: AC | PRN
Start: 2024-07-27 — End: 2025-07-27

## 2024-07-27 NOTE — Telephone Encounter (Signed)
 PER who is calling: Patient (self), Loretta Martin Loretta Martin is a 63 year old female has requested a refill of       Prozac  and albuterol .             Last Office Visit  : 05/02/2023 Gust Pounds, MD      Last Tele Visit:  03/21/2023      Last Physical Exam:  12/11/2018    There are no preventive care reminders to display for this patient.    Other Med Adult:  Most Recent BP Reading(s)  05/02/23 : 115/74        Cholesterol (mg/dL)   Date Value   95/76/7978 255 (*H)     LOW DENSITY LIPOPROTEIN DIRECT (mg/dL)   Date Value   95/76/7978 174     HIGH DENSITY LIPOPROTEIN (mg/dL)   Date Value   95/76/7978 60     TRIGLYCERIDES (mg/dL)   Date Value   95/76/7978 60         THYROID  SCREEN TSH REFLEX FT4 (uIU/mL)   Date Value   05/07/2022 6.100 (H)         TSH (THYROID  STIM HORMONE) (uIU/mL)   Date Value   01/27/2024 5.500 (H)       HEMOGLOBIN A1C (%)   Date Value   01/16/2020 5.3       No results found for: POCA1C      INR (no units)   Date Value   01/25/2020 1.1   05/12/2009 1.0 (L)       SODIUM (mmol/L)   Date Value   07/15/2022 143       POTASSIUM (mmol/L)   Date Value   07/15/2022 4.5           CREATININE (mg/dL)   Date Value   89/87/7976 0.8       Documented patient preferred pharmacies:    [id number] [place] - [address] AT [name]  Phone: [phone] Fax: [phone]

## 2024-09-17 ENCOUNTER — Ambulatory Visit: Admitting: Internal Medicine
